# Patient Record
Sex: Female | Born: 1960 | Race: White | Hispanic: No | Marital: Single | Smoking: Never smoker
Health system: Southern US, Community
[De-identification: ages and names within clinical notes are randomized; demographics above are authoritative.]

## PROBLEM LIST (undated history)

## (undated) DIAGNOSIS — R42 Dizziness and giddiness: Secondary | ICD-10-CM

## (undated) DIAGNOSIS — E785 Hyperlipidemia, unspecified: Secondary | ICD-10-CM

## (undated) DIAGNOSIS — M1990 Unspecified osteoarthritis, unspecified site: Secondary | ICD-10-CM

## (undated) DIAGNOSIS — F411 Generalized anxiety disorder: Secondary | ICD-10-CM

## (undated) DIAGNOSIS — J45909 Unspecified asthma, uncomplicated: Secondary | ICD-10-CM

## (undated) DIAGNOSIS — Z87442 Personal history of urinary calculi: Secondary | ICD-10-CM

## (undated) DIAGNOSIS — J309 Allergic rhinitis, unspecified: Secondary | ICD-10-CM

## (undated) DIAGNOSIS — K219 Gastro-esophageal reflux disease without esophagitis: Secondary | ICD-10-CM

## (undated) HISTORY — DX: Unspecified osteoarthritis, unspecified site: M19.90

## (undated) HISTORY — DX: Gastro-esophageal reflux disease without esophagitis: K21.9

## (undated) HISTORY — DX: Dizziness and giddiness: R42

## (undated) HISTORY — PX: OTHER SURGICAL HISTORY: SHX169

## (undated) HISTORY — DX: Unspecified asthma, uncomplicated: J45.909

## (undated) HISTORY — DX: Personal history of urinary calculi: Z87.442

## (undated) HISTORY — PX: APPENDECTOMY: SHX54

## (undated) HISTORY — DX: Allergic rhinitis, unspecified: J30.9

## (undated) HISTORY — PX: TONSILLECTOMY: SHX5217

## (undated) HISTORY — DX: Hyperlipidemia, unspecified: E78.5

## (undated) HISTORY — DX: Generalized anxiety disorder: F41.1

---

## 1998-10-18 ENCOUNTER — Encounter: Admission: RE | Admit: 1998-10-18 | Discharge: 1999-01-16 | Payer: Self-pay | Admitting: Specialist

## 1999-03-07 ENCOUNTER — Ambulatory Visit (HOSPITAL_COMMUNITY): Admission: RE | Admit: 1999-03-07 | Discharge: 1999-03-07 | Payer: Self-pay | Admitting: Orthopedic Surgery

## 1999-04-06 ENCOUNTER — Other Ambulatory Visit: Admission: RE | Admit: 1999-04-06 | Discharge: 1999-04-06 | Payer: Self-pay | Admitting: Obstetrics & Gynecology

## 2000-05-03 ENCOUNTER — Other Ambulatory Visit: Admission: RE | Admit: 2000-05-03 | Discharge: 2000-05-03 | Payer: Self-pay | Admitting: Obstetrics & Gynecology

## 2001-05-19 ENCOUNTER — Other Ambulatory Visit: Admission: RE | Admit: 2001-05-19 | Discharge: 2001-05-19 | Payer: Self-pay | Admitting: Obstetrics & Gynecology

## 2002-07-03 ENCOUNTER — Other Ambulatory Visit: Admission: RE | Admit: 2002-07-03 | Discharge: 2002-07-03 | Payer: Self-pay | Admitting: Obstetrics & Gynecology

## 2002-12-03 ENCOUNTER — Encounter: Payer: Self-pay | Admitting: Obstetrics & Gynecology

## 2002-12-03 ENCOUNTER — Encounter: Admission: RE | Admit: 2002-12-03 | Discharge: 2002-12-03 | Payer: Self-pay | Admitting: Obstetrics & Gynecology

## 2003-04-20 ENCOUNTER — Encounter: Payer: Self-pay | Admitting: Orthopaedic Surgery

## 2003-04-21 ENCOUNTER — Inpatient Hospital Stay (HOSPITAL_COMMUNITY): Admission: RE | Admit: 2003-04-21 | Discharge: 2003-04-22 | Payer: Self-pay | Admitting: Orthopaedic Surgery

## 2003-04-21 ENCOUNTER — Encounter: Payer: Self-pay | Admitting: Orthopaedic Surgery

## 2003-04-22 ENCOUNTER — Encounter: Payer: Self-pay | Admitting: Orthopaedic Surgery

## 2003-08-12 ENCOUNTER — Other Ambulatory Visit: Admission: RE | Admit: 2003-08-12 | Discharge: 2003-08-12 | Payer: Self-pay | Admitting: Obstetrics & Gynecology

## 2004-01-03 ENCOUNTER — Encounter: Admission: RE | Admit: 2004-01-03 | Discharge: 2004-01-03 | Payer: Self-pay | Admitting: Obstetrics & Gynecology

## 2004-08-25 ENCOUNTER — Other Ambulatory Visit: Admission: RE | Admit: 2004-08-25 | Discharge: 2004-08-25 | Payer: Self-pay | Admitting: Obstetrics & Gynecology

## 2005-01-08 ENCOUNTER — Encounter: Admission: RE | Admit: 2005-01-08 | Discharge: 2005-01-08 | Payer: Self-pay | Admitting: Obstetrics & Gynecology

## 2005-09-14 ENCOUNTER — Other Ambulatory Visit: Admission: RE | Admit: 2005-09-14 | Discharge: 2005-09-14 | Payer: Self-pay | Admitting: Obstetrics & Gynecology

## 2006-01-09 ENCOUNTER — Encounter: Admission: RE | Admit: 2006-01-09 | Discharge: 2006-01-09 | Payer: Self-pay | Admitting: Obstetrics & Gynecology

## 2006-01-29 ENCOUNTER — Encounter: Admission: RE | Admit: 2006-01-29 | Discharge: 2006-01-29 | Payer: Self-pay | Admitting: Obstetrics & Gynecology

## 2007-01-29 ENCOUNTER — Ambulatory Visit: Payer: Self-pay | Admitting: Internal Medicine

## 2007-01-29 LAB — CONVERTED CEMR LAB
Albumin: 3.1 g/dL — ABNORMAL LOW (ref 3.5–5.2)
Alkaline Phosphatase: 60 units/L (ref 39–117)
BUN: 9 mg/dL (ref 6–23)
Basophils Relative: 0.6 % (ref 0.0–1.0)
Bilirubin, Direct: 0.1 mg/dL (ref 0.0–0.3)
CO2: 26 meq/L (ref 19–32)
Calcium: 8.5 mg/dL (ref 8.4–10.5)
Chloride: 109 meq/L (ref 96–112)
Cholesterol: 190 mg/dL (ref 0–200)
Creatinine, Ser: 0.5 mg/dL (ref 0.4–1.2)
Glucose, Bld: 96 mg/dL (ref 70–99)
HCT: 37.4 % (ref 36.0–46.0)
Ketones, ur: NEGATIVE mg/dL
LDL Cholesterol: 91 mg/dL (ref 0–99)
MCHC: 34.6 g/dL (ref 30.0–36.0)
Monocytes Relative: 7.6 % (ref 3.0–11.0)
Platelets: 418 10*3/uL — ABNORMAL HIGH (ref 150–400)
RDW: 12.8 % (ref 11.5–14.6)
Total Bilirubin: 0.6 mg/dL (ref 0.3–1.2)
Total Protein: 6 g/dL (ref 6.0–8.3)
pH: 6.5 (ref 5.0–8.0)

## 2007-02-25 ENCOUNTER — Encounter: Admission: RE | Admit: 2007-02-25 | Discharge: 2007-02-25 | Payer: Self-pay | Admitting: Obstetrics & Gynecology

## 2007-06-14 ENCOUNTER — Ambulatory Visit: Payer: Self-pay | Admitting: Family Medicine

## 2007-06-19 ENCOUNTER — Ambulatory Visit: Payer: Self-pay | Admitting: Internal Medicine

## 2007-06-24 ENCOUNTER — Ambulatory Visit: Payer: Self-pay | Admitting: Internal Medicine

## 2007-06-24 LAB — CONVERTED CEMR LAB
Ketones, ur: NEGATIVE mg/dL
Leukocytes, UA: NEGATIVE
Specific Gravity, Urine: >=1.03 (ref 1.000–1.03)
Total Protein, Urine: NEGATIVE mg/dL

## 2007-06-30 ENCOUNTER — Ambulatory Visit: Payer: Self-pay | Admitting: Cardiology

## 2007-09-09 ENCOUNTER — Encounter: Admission: RE | Admit: 2007-09-09 | Discharge: 2007-09-09 | Payer: Self-pay | Admitting: Internal Medicine

## 2008-02-16 ENCOUNTER — Encounter: Admission: RE | Admit: 2008-02-16 | Discharge: 2008-02-16 | Payer: Self-pay | Admitting: Obstetrics & Gynecology

## 2008-03-08 ENCOUNTER — Telehealth: Payer: Self-pay | Admitting: Internal Medicine

## 2008-10-14 ENCOUNTER — Ambulatory Visit: Payer: Self-pay | Admitting: Internal Medicine

## 2008-10-14 DIAGNOSIS — F411 Generalized anxiety disorder: Secondary | ICD-10-CM

## 2008-10-14 DIAGNOSIS — K219 Gastro-esophageal reflux disease without esophagitis: Secondary | ICD-10-CM | POA: Insufficient documentation

## 2008-10-14 DIAGNOSIS — Z87442 Personal history of urinary calculi: Secondary | ICD-10-CM | POA: Insufficient documentation

## 2008-10-14 DIAGNOSIS — E785 Hyperlipidemia, unspecified: Secondary | ICD-10-CM

## 2008-10-14 DIAGNOSIS — J309 Allergic rhinitis, unspecified: Secondary | ICD-10-CM | POA: Insufficient documentation

## 2008-10-14 DIAGNOSIS — J45909 Unspecified asthma, uncomplicated: Secondary | ICD-10-CM

## 2008-10-14 DIAGNOSIS — M1990 Unspecified osteoarthritis, unspecified site: Secondary | ICD-10-CM

## 2008-10-14 DIAGNOSIS — J069 Acute upper respiratory infection, unspecified: Secondary | ICD-10-CM | POA: Insufficient documentation

## 2008-10-14 HISTORY — DX: Hyperlipidemia, unspecified: E78.5

## 2008-10-14 HISTORY — DX: Unspecified asthma, uncomplicated: J45.909

## 2008-10-14 HISTORY — DX: Unspecified osteoarthritis, unspecified site: M19.90

## 2008-10-14 HISTORY — DX: Allergic rhinitis, unspecified: J30.9

## 2008-10-14 HISTORY — DX: Personal history of urinary calculi: Z87.442

## 2008-10-14 HISTORY — DX: Gastro-esophageal reflux disease without esophagitis: K21.9

## 2008-10-14 HISTORY — DX: Generalized anxiety disorder: F41.1

## 2008-10-14 LAB — CONVERTED CEMR LAB
AST: 20 units/L (ref 0–37)
Albumin: 3.3 g/dL — ABNORMAL LOW (ref 3.5–5.2)
Alkaline Phosphatase: 58 units/L (ref 39–117)
Basophils Relative: 1 % (ref 0.0–3.0)
Bilirubin, Direct: 0.1 mg/dL (ref 0.0–0.3)
CO2: 27 meq/L (ref 19–32)
Calcium: 8.8 mg/dL (ref 8.4–10.5)
Cholesterol: 222 mg/dL (ref 0–200)
Eosinophils Absolute: 0.1 10*3/uL (ref 0.0–0.7)
Eosinophils Relative: 1.7 % (ref 0.0–5.0)
GFR calc Af Amer: 139 mL/min
Glucose, Bld: 106 mg/dL — ABNORMAL HIGH (ref 70–99)
HCT: 36.4 % (ref 36.0–46.0)
HDL: 78.2 mg/dL (ref >39.0)
Lymphocytes Relative: 33 % (ref 12.0–46.0)
Monocytes Relative: 8.1 % (ref 3.0–12.0)
Neutrophils Relative %: 56.2 % (ref 43.0–77.0)
RBC: 4.47 M/uL (ref 3.87–5.11)
RDW: 12.8 % (ref 11.5–14.6)
Triglycerides: 108 mg/dL (ref 0–149)
WBC: 7.6 10*3/uL (ref 4.5–10.5)

## 2008-11-03 ENCOUNTER — Ambulatory Visit: Payer: Self-pay | Admitting: Internal Medicine

## 2008-11-03 DIAGNOSIS — R42 Dizziness and giddiness: Secondary | ICD-10-CM | POA: Insufficient documentation

## 2008-11-03 DIAGNOSIS — H6690 Otitis media, unspecified, unspecified ear: Secondary | ICD-10-CM | POA: Insufficient documentation

## 2008-11-03 HISTORY — DX: Dizziness and giddiness: R42

## 2009-02-14 ENCOUNTER — Telehealth (INDEPENDENT_AMBULATORY_CARE_PROVIDER_SITE_OTHER): Payer: Self-pay | Admitting: *Deleted

## 2010-01-13 ENCOUNTER — Ambulatory Visit: Payer: Self-pay | Admitting: Internal Medicine

## 2010-01-13 DIAGNOSIS — H1030 Unspecified acute conjunctivitis, unspecified eye: Secondary | ICD-10-CM | POA: Insufficient documentation

## 2010-01-13 DIAGNOSIS — J019 Acute sinusitis, unspecified: Secondary | ICD-10-CM | POA: Insufficient documentation

## 2010-01-31 ENCOUNTER — Encounter: Admission: RE | Admit: 2010-01-31 | Discharge: 2010-01-31 | Payer: Self-pay | Admitting: Obstetrics & Gynecology

## 2010-07-07 ENCOUNTER — Ambulatory Visit: Payer: Self-pay | Admitting: Internal Medicine

## 2010-07-07 DIAGNOSIS — L03119 Cellulitis of unspecified part of limb: Secondary | ICD-10-CM

## 2010-07-07 DIAGNOSIS — L02519 Cutaneous abscess of unspecified hand: Secondary | ICD-10-CM | POA: Insufficient documentation

## 2010-09-06 ENCOUNTER — Ambulatory Visit: Payer: Self-pay | Admitting: Internal Medicine

## 2010-09-06 LAB — CONVERTED CEMR LAB
ALT: 20 units/L (ref 0–35)
AST: 17 units/L (ref 0–37)
Albumin: 3.5 g/dL (ref 3.5–5.2)
BUN: 16 mg/dL (ref 6–23)
Basophils Relative: 0.7 % (ref 0.0–3.0)
Bilirubin, Direct: 0.1 mg/dL (ref 0.0–0.3)
Calcium: 9.1 mg/dL (ref 8.4–10.5)
Chloride: 107 meq/L (ref 96–112)
Creatinine, Ser: 0.7 mg/dL (ref 0.4–1.2)
Direct LDL: 109.3 mg/dL
GFR calc non Af Amer: 99.94 mL/min (ref >60)
Glucose, Bld: 86 mg/dL (ref 70–99)
HDL: 72.7 mg/dL (ref >39.00)
Lymphocytes Relative: 25.3 % (ref 12.0–46.0)
MCHC: 33.9 g/dL (ref 30.0–36.0)
Monocytes Relative: 6 % (ref 3.0–12.0)
Neutro Abs: 7.4 10*3/uL (ref 1.4–7.7)
Neutrophils Relative %: 66.5 % (ref 43.0–77.0)
Potassium: 4.8 meq/L (ref 3.5–5.1)
RDW: 14.5 % (ref 11.5–14.6)
Sodium: 138 meq/L (ref 135–145)
Total Protein, Urine: NEGATIVE mg/dL
Total Protein: 6.2 g/dL (ref 6.0–8.3)
Urine Glucose: NEGATIVE mg/dL
Urobilinogen, UA: 0.2 (ref 0.0–1.0)
VLDL: 20.8 mg/dL (ref 0.0–40.0)

## 2010-11-14 ENCOUNTER — Ambulatory Visit (HOSPITAL_COMMUNITY)
Admission: RE | Admit: 2010-11-14 | Discharge: 2010-11-14 | Payer: Self-pay | Source: Home / Self Care | Attending: Orthopedic Surgery | Admitting: Orthopedic Surgery

## 2010-12-02 ENCOUNTER — Other Ambulatory Visit: Payer: Self-pay | Admitting: Obstetrics & Gynecology

## 2010-12-02 DIAGNOSIS — Z1239 Encounter for other screening for malignant neoplasm of breast: Secondary | ICD-10-CM

## 2010-12-03 ENCOUNTER — Encounter: Payer: Self-pay | Admitting: Obstetrics & Gynecology

## 2010-12-12 NOTE — Assessment & Plan Note (Signed)
Summary: 1 mos f/u/cd   Vital Signs:  Patient profile:   50 year old female Height:      69 inches Weight:      284.50 pounds O2 Sat:      98 % Temp:     98.9 degrees F Pulse rate:   80 / minute BP sitting:   140 / 82  (left arm) Cuff size:   large  Vitals Entered By: Jarome Lamas (September 06, 2010 9:27 AM)  Preventive Care Screening  Last Flu Shot:    Date:  08/29/2010    Results:  given      schedled for yearly  pap in feb 2012, due for mammogram  CC: 1 month fl/up  /pb Comments pt no longer taking septra ds.   CC:  1 month fl/up  /pb.  History of Present Illness: here for wellness, overall doing well ; due for c-spine surgury in fev 2012, chronic pain no change and no bowel or bladder change, fever, wt loss, or worsening extremity pain/weak/numb. Had flu shot ta work earlier this season. Pt denies CP, worsening sob, doe, wheezing, orthopnea, pnd, worsening LE edema, palps, dizziness or syncope  Pt denies new neuro symptoms such as headache, facial or extremity weakness  Pt denies polydipsia, polyuria.  Overall good compliance with meds, trying to follow low chol  diet, wt stable, little excercise however Hard to lose wt.  Denies worsening depressive symptoms, suicidal ideation, or panic.  No fever, wt loss, night sweats, loss of appetite or other constitutional symptoms  Pt states good ability with ADL's, low fall risk, home safety reviewed and adequate, no significant change in hearing or vision, trying to follow lower chol diet, and occasionally active only with regular excercise.   Preventive Screening-Counseling & Management      Drug Use:  no.    Problems Prior to Update: 1)  Cellulitis&abscess of Hand Except Fingers&thumb  (ICD-682.4) 2)  Conjunctivitis, Acute, Bilateral  (ICD-372.00) 3)  Sinusitis- Acute-nos  (ICD-461.9) 4)  Intermittent Vertigo  (ICD-780.4) 5)  Otitis Media, Acute, Left  (ICD-382.9) 6)  Nephrolithiasis, Hx of  (ICD-V13.01) 7)  Hyperlipidemia   (ICD-272.4) 8)  Anxiety  (ICD-300.00) 9)  Asthma  (ICD-493.90) 10)  Uri  (ICD-465.9) 11)  Preventive Health Care  (ICD-V70.0) 12)  Degenerative Joint Disease  (ICD-715.90) 13)  Gerd  (ICD-530.81) 14)  Allergic Rhinitis  (ICD-477.9)  Medications Prior to Update: 1)  Nexium 40 Mg Cpdr (Esomeprazole Magnesium) .Marland Kitchen.. 1 By Mouth Once Daily 2)  Indomethacin Cr 75 Mg Cr-Caps (Indomethacin) .Marland Kitchen.. 1 By Mouth Two Times A Day As Needed 3)  Septra Ds 800-160 Mg Tabs (Sulfamethoxazole-Trimethoprim) .Marland Kitchen.. 1po Two Times A Day 4)  Hydrocodone-Acetaminophen 7.5-325 Mg Tabs (Hydrocodone-Acetaminophen) .Marland Kitchen.. 1po Q 4-6 Hrs As Needed Pain 5)  Lyrica 75 Mg Caps (Pregabalin) .Marland Kitchen.. 1 By Mouth Two Times A Day  Current Medications (verified): 1)  Nexium 40 Mg Cpdr (Esomeprazole Magnesium) .Marland Kitchen.. 1 By Mouth Once Daily 2)  Indomethacin Cr 75 Mg Cr-Caps (Indomethacin) .Marland Kitchen.. 1 By Mouth Two Times A Day As Needed 3)  Septra Ds 800-160 Mg Tabs (Sulfamethoxazole-Trimethoprim) .Marland Kitchen.. 1po Two Times A Day 4)  Hydrocodone-Acetaminophen 7.5-325 Mg Tabs (Hydrocodone-Acetaminophen) .Marland Kitchen.. 1po Q 4-6 Hrs As Needed Pain 5)  Lyrica 75 Mg Caps (Pregabalin) .Marland Kitchen.. 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  Past Surgical History: Last updated: 10/14/2008 s/p nasal surgury x 2 Appendectomy Tonsillectomy s/p right knee surgury x 4 (cartilage torn) s/p left knee surgury x 1 s/p c-spine disc surgury 2004 s/p left ulnar nerve surgury x 2 Caesarean section  Family History: Last updated: 10/14/2008 father with renal stone grandfather died with DM, cancer  Social History: Last updated: 09/06/2010 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 father with renal stone grandfather died with DM, cancer  Social History: Reviewed history from 10/14/2008 and no changes required. 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         all otherwise negative per pt -    Physical Exam  General:  alert and overweight-appearing.   Head:  normocephalic and atraumatic.   Eyes:  vision grossly intact, pupils equal, and pupils round.   Ears:  R ear normal and L ear normal.   Nose:  no external deformity and no nasal discharge.   Mouth:  no gingival abnormalities and pharynx pink and moist.   Neck:  supple and no masses.   Lungs:  normal respiratory effort and normal breath sounds.   Heart:  normal rate and regular rhythm.   Abdomen:  soft, non-tender, and normal bowel sounds.   Msk:  no joint tenderness and no joint swelling.   Extremities:  no edema, no erythema  Neurologic:  strength normal in all extremities and gait normal.   Skin:  color normal and no rashes.   Psych:  not depressed appearing and slightly anxious.     Impression & Recommendations:  Problem # 1:  Preventive Health Care (ICD-V70.0) Overall doing well, age appropriate education and counseling updated, referral for preventive services and immunizations addressed, dietary counseling and smoking status adressed , most  recent labs reviewed, ecg reviewed or declined I have personally reviewed and have noted 1.The patient's medical and social history 2.Their use of alcohol, tobacco or illicit drugs 3.Their current medications and supplements 4. Functional ability including ADL's, fall risk, home safety risk, hearing & visual impairment  5.Diet and physical activities 6.Evidence for depression or mood disorders The patients weight, height, BMI  have been recorded in the chart I have made referrals, counseling and provided education to the patient based review of the above  Orders: TLB-BMP (Basic Metabolic Panel-BMET) (80048-METABOL) TLB-CBC Platelet - w/Differential (85025-CBCD) TLB-Hepatic/Liver Function Pnl (80076-HEPATIC) TLB-Lipid Panel (80061-LIPID) TLB-TSH (Thyroid Stimulating Hormone) (84443-TSH) TLB-Udip ONLY (81003-UDIP)  Complete Medication List: 1)  Nexium 40 Mg Cpdr (Esomeprazole magnesium) .Marland Kitchen.. 1 by mouth once daily 2)  Indomethacin Cr 75 Mg Cr-caps (Indomethacin) .Marland Kitchen.. 1 by mouth two times a day as needed 3)  Septra Ds 800-160 Mg Tabs (Sulfamethoxazole-trimethoprim) .Marland Kitchen.. 1po two times a day 4)  Hydrocodone-acetaminophen 7.5-325 Mg Tabs (Hydrocodone-acetaminophen) .Marland Kitchen.. 1po q 4-6 hrs as needed pain 5)  Lyrica 75 Mg Caps (Pregabalin) .Marland Kitchen.. 1 by mouth two times a day  Patient Instructions: 1)  please call for your yearly mammogram 2)  Please go to the Lab in the basement for your blood and/or urine tests today 3)  Please call the number on the [facility] Card for results of your testing  4)  Please schedule a follow-up appointment in 1 year, or sooner if needed   Orders Added: 1)  TLB-BMP (Basic Metabolic Panel-BMET) [80048-METABOL] 2)  TLB-CBC Platelet - w/Differential [85025-CBCD] 3)  TLB-Hepatic/Liver Function Pnl [80076-HEPATIC] 4)  TLB-Lipid Panel [80061-LIPID] 5)  TLB-TSH (Thyroid Stimulating Hormone) [84443-TSH] 6)  TLB-Udip ONLY [81003-UDIP] 7)  Est. Patient 40-64 years [21308]

## 2010-12-12 NOTE — Assessment & Plan Note (Signed)
Summary: SPIDER BITE---STC   Vital Signs:  Patient profile:   50 year old female Height:      69 inches Weight:      289.75 pounds BMI:     42.94 O2 Sat:      97 % on Room air Temp:     98.8 degrees F oral Pulse rate:   89 / minute BP sitting:   112 / 80  (left arm) Cuff size:   large  Vitals Entered By: Zella Ball Ewing CMA (AAMA) (July 07, 2010 2:22 PM)  O2 Flow:  Room air CC: Spider bite,  thumb on right hand, swollen, sharp pains/RE   CC:  Spider bite, thumb on right hand, swollen, and sharp pains/RE.  History of Present Illness: here after working in the yard, and believes she was bitten by an insect/spider to the end of the left thumb 2 days ago, now with worsening mild to mod red, swelling, tender to the whole distal left thmub past the DIP;  no red streaks or fluctuance or drainage or fever, chills.  No ulcers or traums, or hx of gout.  Problems Prior to Update: 1)  Cellulitis&abscess of Hand Except Fingers&thumb  (ICD-682.4) 2)  Conjunctivitis, Acute, Bilateral  (ICD-372.00) 3)  Sinusitis- Acute-nos  (ICD-461.9) 4)  Intermittent Vertigo  (ICD-780.4) 5)  Otitis Media, Acute, Left  (ICD-382.9) 6)  Nephrolithiasis, Hx of  (ICD-V13.01) 7)  Hyperlipidemia  (ICD-272.4) 8)  Anxiety  (ICD-300.00) 9)  Asthma  (ICD-493.90) 10)  Uri  (ICD-465.9) 11)  Preventive Health Care  (ICD-V70.0) 12)  Degenerative Joint Disease  (ICD-715.90) 13)  Gerd  (ICD-530.81) 14)  Allergic Rhinitis  (ICD-477.9)  Medications Prior to Update: 1)  Nexium 40 Mg Cpdr (Esomeprazole Magnesium) .Marland Kitchen.. 1 By Mouth Once Daily 2)  Singulair 10 Mg Tabs (Montelukast Sodium) .Marland Kitchen.. 1 By Mouth Daily 3)  Tri-Sprintec 0.035 Mg Tabs (Norgestimate-Ethinyl Estradiol) .Marland Kitchen.. 1 By Mouth Daily 4)  Tylenol Extra Strength 500 Mg Tabs (Acetaminophen) 5)  Indomethacin Cr 75 Mg Cr-Caps (Indomethacin) .Marland Kitchen.. 1 By Mouth Two Times A Day As Needed 6)  Avelox 400 Mg Tabs (Moxifloxacin Hcl) .Marland Kitchen.. 1po Once Daily  Current Medications  (verified): 1)  Nexium 40 Mg Cpdr (Esomeprazole Magnesium) .Marland Kitchen.. 1 By Mouth Once Daily 2)  Indomethacin Cr 75 Mg Cr-Caps (Indomethacin) .Marland Kitchen.. 1 By Mouth Two Times A Day As Needed 3)  Septra Ds 800-160 Mg Tabs (Sulfamethoxazole-Trimethoprim) .Marland Kitchen.. 1po Two Times A Day 4)  Hydrocodone-Acetaminophen 7.5-325 Mg Tabs (Hydrocodone-Acetaminophen) .Marland Kitchen.. 1po Q 4-6 Hrs As Needed Pain 5)  Lyrica 75 Mg Caps (Pregabalin) .Marland Kitchen.. 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   Head:  normocephalic and atraumatic.   Eyes:  vision grossly intact, pupils equal, and pupils round.  Ears:  R ear normal and L ear normal.   Nose:  no external deformity and no nasal discharge.   Mouth:  no gingival abnormalities and pharynx pink and moist.   Neck:  supple and no masses.   Lungs:  normal respiratory effort and normal breath sounds.   Heart:  normal rate and regular rhythm.   Extremities:  no edema, no erythema  Skin:  left distal thumb with 1-2 + erythema, tender and swelling distal to the MTP without flucutance or drainage or red streaks; DIP joint without effusion or tednerness and has FROM   Impression &  Recommendations:  Problem # 1:  CELLULITIS&ABSCESS OF HAND EXCEPT FINGERS&THUMB (ICD-682.4)  Her updated medication list for this problem includes:    Septra Ds 800-160 Mg Tabs (Sulfamethoxazole-trimethoprim) .Marland Kitchen... 1po two times a day left first thumb - treat as above, f/u any worsening signs or symptoms   Complete Medication List: 1)  Nexium 40 Mg Cpdr (Esomeprazole magnesium) .Marland Kitchen.. 1 by mouth once daily 2)  Indomethacin Cr 75 Mg Cr-caps (Indomethacin) .Marland Kitchen.. 1 by mouth two times a day as needed 3)  Septra Ds 800-160 Mg Tabs (Sulfamethoxazole-trimethoprim) .Marland Kitchen.. 1po two times a day 4)  Hydrocodone-acetaminophen 7.5-325 Mg Tabs (Hydrocodone-acetaminophen) .Marland Kitchen.. 1po q 4-6 hrs as needed pain 5)  Lyrica 75 Mg Caps (Pregabalin) .Marland Kitchen.. 1 by mouth two times a day  Patient Instructions: 1)  Please take all new medications as prescribed 2)  Continue all previous medications as before this visit  3)  Please schedule a follow-up appointment in 1 month with CPX labs Prescriptions: HYDROCODONE-ACETAMINOPHEN 7.5-325 MG TABS (HYDROCODONE-ACETAMINOPHEN) 1po q 4-6 hrs as needed pain  #40 x 0   Entered and Authorized by:   Corwin Levins MD   Signed by:   Corwin Levins MD on 07/07/2010   Method used:   Print then Give to Patient   RxID:   [id number] SEPTRA DS 800-160 MG TABS (SULFAMETHOXAZOLE-TRIMETHOPRIM) 1po two times a day  #20 x 0   Entered and Authorized by:   Corwin Levins MD   Signed by:   Corwin Levins MD on 07/07/2010   Method used:   Print then Give to Patient   RxID:   [id number]

## 2010-12-12 NOTE — Assessment & Plan Note (Signed)
Summary: SINUS INFECTION /NWS  #   Vital Signs:  Patient profile:   50 year old female Height:      68 inches (172.72 cm) Weight:      305.75 pounds (138.98 kg) BMI:     46.66 O2 Sat:      98 % on Room air Temp:     97.9 degrees F (36.61 degrees C) oral Pulse rate:   111 / minute BP sitting:   140 / 90  (left arm) Cuff size:   large  Vitals Entered By: Sydell Axon (January 13, 2010 4:16 PM)  O2 Flow:  Room air CC: Sinus pressure, green/yellow nasal discharge, occasional blood; sore throat   CC:  Sinus pressure, green/yellow nasal discharge, and occasional blood; sore throat.  History of Present Illness: here after being tx for pink eye at urgent care, then saw optho and tx wti prednisolone eye gtt with pt report that she was dx as not having pink eye; and now with 2 days onset grad worsening now severe facial pain, pressure, fever and greenish d/c, and slight bloody nasal d/c; with midl ST, but Pt denies CP, sob, doe, wheezing, orthopnea, pnd, worsening LE edema, palps, dizziness or syncope Pt denies new neuro symptoms such as headache, facial or extremity weakness   Problems Prior to Update: 1)  Intermittent Vertigo  (ICD-780.4) 2)  Otitis Media, Acute, Left  (ICD-382.9) 3)  Nephrolithiasis, Hx of  (ICD-V13.01) 4)  Hyperlipidemia  (ICD-272.4) 5)  Anxiety  (ICD-300.00) 6)  Asthma  (ICD-493.90) 7)  Uri  (ICD-465.9) 8)  Preventive Health Care  (ICD-V70.0) 9)  Degenerative Joint Disease  (ICD-715.90) 10)  Gerd  (ICD-530.81) 11)  Allergic Rhinitis  (ICD-477.9)  Medications Prior to Update: 1)  Nexium 40 Mg Cpdr (Esomeprazole Magnesium) .Marland Kitchen.. 1 By Mouth Once Daily 2)  Singulair 10 Mg Tabs (Montelukast Sodium) .Marland Kitchen.. 1 By Mouth Daily 3)  Tri-Sprintec 0.035 Mg Tabs (Norgestimate-Ethinyl Estradiol) .Marland Kitchen.. 1 By Mouth Daily 4)  Tylenol Extra Strength 500 Mg Tabs (Acetaminophen) 5)  Indomethacin Cr 75 Mg Cr-Caps (Indomethacin) .Marland Kitchen.. 1 By Mouth Two Times A Day As Needed 6)   Cephalexin 500 Mg Caps (Cephalexin) .Marland Kitchen.. 1po Three Times A Day 7)  Meclizine Hcl 12.5 Mg Tabs (Meclizine Hcl) .Marland Kitchen.. 1 - 2 By Mouth Q 6 Hrs As Needed Dizzy  Current Medications (verified): 1)  Nexium 40 Mg Cpdr (Esomeprazole Magnesium) .Marland Kitchen.. 1 By Mouth Once Daily 2)  Singulair 10 Mg Tabs (Montelukast Sodium) .Marland Kitchen.. 1 By Mouth Daily 3)  Tri-Sprintec 0.035 Mg Tabs (Norgestimate-Ethinyl Estradiol) .Marland Kitchen.. 1 By Mouth Daily 4)  Tylenol Extra Strength 500 Mg Tabs (Acetaminophen) 5)  Indomethacin Cr 75 Mg Cr-Caps (Indomethacin) .Marland Kitchen.. 1 By Mouth Two Times A Day As Needed 6)  Avelox 400 Mg Tabs (Moxifloxacin Hcl) .Marland Kitchen.. 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 , mild ill  Head:  normocephalic and atraumatic.   Eyes:  vision grossly intact, pupils equal, and pupils round,  bilat conjunct with erythema and clearish d/c.   Ears:  bilat tm's with mild erythema, canal ok, sinus marked tender bilat Nose:  nasal dischargemucosal pallor and mucosal edema.   Mouth:  pharyngeal erythema and fair dentition.   Neck:  supple and cervical lymphadenopathy.   Lungs:  normal respiratory effort and normal breath sounds.   Heart:  normal rate and  regular rhythm.   Extremities:  no edema, no erythema    Impression & Recommendations:  Problem # 1:  SINUSITIS- ACUTE-NOS (ICD-461.9)  Her updated medication list for this problem includes:    Avelox 400 Mg Tabs (Moxifloxacin hcl) .Marland Kitchen... 1po once daily treat as above, f/u any worsening signs or symptoms   Problem # 2:  CONJUNCTIVITIS, ACUTE, BILATERAL (ICD-372.00) ok to cont the prednisolone as per opthomology  Problem # 3:  ASTHMA (ICD-493.90)  Her updated medication list for this problem includes:    Singulair 10 Mg Tabs (Montelukast sodium) .Marland Kitchen... 1 by mouth daily stable overall by hx and exam, ok to continue meds/tx as is   Problem # 4:  HYPERLIPIDEMIA (ICD-272.4)  Labs Reviewed: SGOT: 20 (10/14/2008)   SGPT: 20 (10/14/2008)   HDL:78.2 (10/14/2008), 72.9 (01/29/2007)  LDL:DEL (10/14/2008), 91 (32/35/5732)  Chol:222 (10/14/2008), 190 (01/29/2007)  Trig:108 (10/14/2008), 133 (01/29/2007) d/w - to cont diet, goal ldl less than 100, consider statin  Complete Medication List: 1)  Nexium 40 Mg Cpdr (Esomeprazole magnesium) .Marland Kitchen.. 1 by mouth once daily 2)  Singulair 10 Mg Tabs (Montelukast sodium) .Marland Kitchen.. 1 by mouth daily 3)  Tri-sprintec 0.035 Mg Tabs (Norgestimate-ethinyl estradiol) .Marland Kitchen.. 1 by mouth daily 4)  Tylenol Extra Strength 500 Mg Tabs (Acetaminophen) 5)  Indomethacin Cr 75 Mg Cr-caps (Indomethacin) .Marland Kitchen.. 1 by mouth two times a day as needed 6)  Avelox 400 Mg Tabs (Moxifloxacin hcl) .Marland Kitchen.. 1po once daily  Patient Instructions: 1)  Please take all new medications as prescribed 2)  Continue all previous medications as before this visit  3)  Please schedule a follow-up appointment in 6 months wtih CPX labs Prescriptions: INDOMETHACIN CR 75 MG CR-CAPS (INDOMETHACIN) 1 by mouth two times a day as needed  #60 x 11   Entered and Authorized by:   Corwin Levins MD   Signed by:   Corwin Levins MD on 01/13/2010   Method used:   Print then Give to Patient   RxID:    [id number] AVELOX 400 MG TABS (MOXIFLOXACIN HCL) 1po once daily  #10 x 0   Entered and Authorized by:   Corwin Levins MD   Signed by:   Corwin Levins MD on 01/13/2010   Method used:   Print then Give to Patient   RxID:   [id number]

## 2011-01-22 LAB — SURGICAL PCR SCREEN
MRSA, PCR: NEGATIVE
Staphylococcus aureus: NEGATIVE

## 2011-01-22 LAB — BASIC METABOLIC PANEL
Chloride: 104 mEq/L (ref 96–112)
Creatinine, Ser: 0.74 mg/dL (ref 0.4–1.2)
Glucose, Bld: 88 mg/dL (ref 70–99)
Potassium: 5 mEq/L (ref 3.5–5.1)

## 2011-01-22 LAB — CBC
Hemoglobin: 15.1 g/dL — ABNORMAL HIGH (ref 12.0–15.0)
MCHC: 32.4 g/dL (ref 30.0–36.0)
MCV: 88.1 fL (ref 78.0–100.0)
RDW: 13 % (ref 11.5–15.5)

## 2011-01-22 LAB — DIFFERENTIAL
Basophils Absolute: 0.1 10*3/uL (ref 0.0–0.1)
Eosinophils Relative: 2 % (ref 0–5)
Lymphocytes Relative: 30 % (ref 12–46)
Lymphs Abs: 2.9 10*3/uL (ref 0.7–4.0)
Neutro Abs: 5.9 10*3/uL (ref 1.7–7.7)
Neutrophils Relative %: 59 % (ref 43–77)

## 2011-01-22 LAB — APTT: aPTT: 28 seconds (ref 24–37)

## 2011-01-22 LAB — PROTIME-INR
INR: 0.9 (ref 0.00–1.49)
Prothrombin Time: 12.4 seconds (ref 11.6–15.2)

## 2011-02-02 ENCOUNTER — Ambulatory Visit: Payer: Self-pay

## 2011-02-02 ENCOUNTER — Ambulatory Visit
Admission: RE | Admit: 2011-02-02 | Discharge: 2011-02-02 | Disposition: A | Discharge status: Home / Self Care | Payer: 59 | Source: Ambulatory Visit | Attending: Obstetrics & Gynecology | Admitting: Obstetrics & Gynecology

## 2011-02-02 DIAGNOSIS — Z1239 Encounter for other screening for malignant neoplasm of breast: Secondary | ICD-10-CM

## 2011-03-08 ENCOUNTER — Other Ambulatory Visit: Payer: Self-pay | Admitting: Internal Medicine

## 2011-03-30 NOTE — H&P (Signed)
[name], [name]                          ACCOUNT NO.:  [id number]   MEDICAL RECORD NO.:  [id number]                   PATIENT TYPE:  INP   LOCATION:  5023                                 FACILITY:  [facility]   PHYSICIAN:  Sharolyn Douglas, M.D.                     DATE OF BIRTH:  1961-08-24   DATE OF ADMISSION:  04/21/2003  DATE OF DISCHARGE:  04/22/2003                                HISTORY & PHYSICAL   CHIEF COMPLAINT:  Neck and left upper extremity pain.   HISTORY OF PRESENT ILLNESS:  The patient is a 50 year old female with neck  and left upper extremity pain for a number of months now.  She has failed  conservative treatment.  The pain has been getting progressively worse.  It  is interfering with her activities of daily living and quality of life.  Risks and benefits of the proposed surgery were discussed with the patient  by Dr. Noel Gerold as well as myself.  She indicated understanding and opted to  proceed.   ALLERGIES:  CODEINE, TRAMADOL, and HYDROCODONE all cause itching.   MEDICATIONS:  1. Flonase one spray in each nostril daily.  2. Singulair 10 p.o. daily.  3. Nexium 40 mg p.o. daily.   PAST MEDICAL HISTORY:  1. Asthma.  2. Gastroesophageal reflux disease.   PAST SURGICAL HISTORY:  1. Left knee scope.  2. Right knee scope x4.  3. Left wrist ORIF.  4. Nerve repair.  5. C-section.   HABITS:  She denies tobacco use.  She denies alcohol use.   SOCIAL HISTORY:  She is single.  She has one child who is 4 years old.  She has family to help with her postoperative course.   FAMILY HISTORY:  Mother alive at age 55 with osteoarthritis, otherwise  healthy.   REVIEW OF SYSTEMS:  The patient denies any fevers, chills, sweats or  bleeding tendencies.  CNS:  Denies blurring vision, double vision, seizures,  headache or paralysis.  CARDIOVASCULAR:  Denies chest pain, angina,  orthopnea, claudication, or palpitations.  PULMONARY:  Denies shortness of  breath, productive  cough or hemoptysis.  GI:  Denies nausea, vomiting,  constipation, diarrhea, melena or bloody stools.  GU:  Denies dysuria,  hematuria or discharge.  MUSCULOSKELETAL:  As per HPI.   PHYSICAL EXAMINATION:  GENERAL APPEARANCE:  The patient is an 50 year old  female who is alert and oriented and in no acute distress.  She is well-  developed, well-nourished and appears her stated age.  She is pleasant and  cooperative to examination.  VITAL SIGNS:  Blood pressure 132/84, respiratory rate 16 and unlabored.  Pulses 84 and regular.  HEENT:  Head is normocephalic and atraumatic.  Pupils are equal, round and  reactive to light.  Extraocular movements intact.  Nose patent.  Pharynx is  clear.  NECK:  Soft to palpation.  No lymphadenopathy, thyromegaly  or bruits  appreciated.  CHEST:  Clear to auscultation bilaterally.  No rales, rhonchi, stridors,  wheezes or friction rubs.  BREASTS:  Not pertinent and not performed.  CARDIOVASCULAR:  S1 and S2 regular rate and rhythm with no murmurs, rubs, or  gallops.  ABDOMEN:  Positive bowel sounds, nontender and nondistended, no organomegaly  noted.  Soft to palpation.  GU:  Not pertinent and not performed.  EXTREMITIES:  Left upper extremity pain.  Sensation and motor function  grossly intact.  Pulses intact and symmetric.  Skin intact without any  lesions or rashes.   X-ray shows C5-6 and 6-7 degenerative disc disease and MRI shows herniated  nucleus pulposus.   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   PLAN:  1. Admit to [facility] on April 19, 2003, for ACDF of C5-6 and 6-7 by     Dr.  Sharolyn Douglas.  2. The patient's primary care physician is Dr. Oliver Barre. We will certainly     call him should any medical issues arise in the perioperative course.     Verlin Fester, P.A.                       Sharolyn Douglas, M.D.    CM/MEDQ  D:  04/22/2003  T:  04/22/2003  Job:  [id number]

## 2011-03-30 NOTE — Op Note (Signed)
NAMEDEVITA, NIES                          ACCOUNT NO.:  [id number]   MEDICAL RECORD NO.:  [id number]                   PATIENT TYPE:  INP   LOCATION:  2899                                 FACILITY:  [facility]   PHYSICIAN:  Sharolyn Douglas, M.D.                     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   PROCEDURE:  1. Anterior cervical diskectomy C5-6 and C6-7 with decompression of the     spinal cord and nerve roots bilaterally.  2. Anterior cervical arthrodesis C5-6 and C6-7 with placement of two Synthes     allograft prosthesis spacers packed with DBN bone graft.  3. Anterior cervical plating C5 through C7 utilizing the [facility]     system.  4. Neural monitoring utilizing SSEP's.   SURGEON:  Sharolyn Douglas, M.D.   ASSISTANT:  Verlin Fester, P.A.   ANESTHESIA:  General endotracheal.   COMPLICATIONS:  None.   INDICATIONS FOR PROCEDURE:  The patient is a 50 year old female with  persistent upper extremity radiculopathy, left greater than right secondary  to disk herniations at C5-6 and C6-7 documented in MRI scan.  After failing  a long and appropriate course of conservative treatment, she elected to  undergo anterior cervical diskectomy and fusion at C5-6 and C6-7 with the  hopes of improving her symptoms. Risks and benefits were extensively  reviewed with her. She elected to proceed.   DESCRIPTION OF PROCEDURE:  The patient was properly identified in the  holding area, taken to the operating room. She underwent general  endotracheal anesthesia without difficulty. She was given prophylactic IV  antibiotics.  She was carefully positioned on the operating room table with  the Mayfield head rest.  A towel roll was placed under her shoulder blades.  Her neck was placed in slight hyperextension.  Neural monitoring was  established in the form of SSEP's.  Five pounds of Halter traction was  applied. The neck was prepped and draped in the usual sterile fashion. A 4  cm left-sided transverse incision was made at the level of the cricoid  cartilage. Dissection was then carried down sharply through the platysma. We  encountered a large vein in the interval between the SCM and strap muscles.  This was ligated and tied off with 4-0 silk suture.  We then developed the  plane between the SCM and strap muscles down to the prevertebral space. We  identified the esophagus, trachea, and carotid sheath and protected these  structures at all times.  The first disk space identified was C5-6. A spinal  needle was placed and we confirmed our location with intraoperative x-ray.  We then elevated the longus coli muscle out over the C5-6 and C6-7 disk  spaces using  electrocautery.  The deep shadowline retractor was placed.  Subtotal diskectomies were performed at C5-6 and C6-7 utilizing sharp  annulotomy and pituitary curets.  The surgical microscope was draped and  brought into the field.  We placed Caspar distraction pins at the C5-6  level. We removed the disk material back to the PLL.  The foramen were found  to be tight secondary to uncovertebral spurring left greater than right.  Foraminotomies were performed bilaterally.  A disk fragment was removed from  the left posterolateral and foraminal position.  We took down the PLL along  the left side confirming that there was not a subligamentous component to  the herniation.  When we were done, the foramen were widely decompressed.  The posterior vertebral margins were undercut using a Kerrison.  We then  placed a 9 mm Synthes allograft prosthesis spacer packed with DBN bone graft  substitute into the 5-6 disk space. We carefully recessed this 1 mm.  We  then performed a similar procedure at C6-7. At this level, there were large  anterior osteophytes that were taken down  using Leksell rongeur.  The disk  space was severely degenerative.  We used the highspeed bur to take down the  uncovertebral osteophytes. There was severe foraminal narrowing bilaterally  left greater than right. We found a disk herniation again in the left  posterolateral position.  We used the nerve hook to remove disk material  from behind the C7 body.  The posterior vertebral margins were then undercut  using the Kerrison punch.  When we were done, the foramen were widely patent  bilaterally.  The posterior longitudinal ligament was taken down from the  mid body out to the left foramen.  We then placed a 9 mm Synthes allograft  prosthesis spacer packed with DBN bone graft material.  All bleeding was  controlled with bipolar electrocautery and Gelfoam. We then turned our  attention to plating the anterior cervical spine utilizing an ABI Viewlock  plate.  Six 14 mm screws were placed.  We confirmed the locking mechanism  engaged. The wound was copiously irrigated. We placed a deep Penrose drain.  Intraoperative x-ray showed good positioning of the C5 and C6 bone graft and  plate. Unfortunately, because of the patient's body habitus, we were unable  to completely visualize the C6-7 interspace.  The esophagus, trachea, and  carotid sheath were examined and there were no apparent injuries. The  platysma was closed with interrupted 2-0 Vicryl, subcutaneous layer closed  with interrupted 3-0 Vicryl followed by a running 4-0 subcuticular suture on  the skin. Benzoin and Steri-Strips were placed. A sterile dressing applied.  Soft cervical collar placed.  The patient extubated without difficulty,  transferred to the recovery room in stable condition.  It should be noted  there were no deleterious changes in the SSEP's throughout the procedure.                                               Sharolyn Douglas, M.D.    MC/MEDQ  D:  04/21/2003  T:  04/21/2003  Job:  [id number]

## 2011-10-10 ENCOUNTER — Other Ambulatory Visit: Payer: Self-pay | Admitting: Internal Medicine

## 2011-11-09 ENCOUNTER — Other Ambulatory Visit: Payer: Self-pay | Admitting: Internal Medicine

## 2011-11-11 ENCOUNTER — Encounter: Payer: Self-pay | Admitting: Internal Medicine

## 2011-11-11 DIAGNOSIS — Z0000 Encounter for general adult medical examination without abnormal findings: Secondary | ICD-10-CM

## 2011-11-11 DIAGNOSIS — Z0001 Encounter for general adult medical examination with abnormal findings: Secondary | ICD-10-CM | POA: Insufficient documentation

## 2011-11-12 ENCOUNTER — Encounter: Payer: Self-pay | Admitting: Internal Medicine

## 2011-11-14 ENCOUNTER — Encounter: Payer: Self-pay | Admitting: Internal Medicine

## 2011-11-14 ENCOUNTER — Other Ambulatory Visit (INDEPENDENT_AMBULATORY_CARE_PROVIDER_SITE_OTHER): Payer: 59

## 2011-11-14 ENCOUNTER — Ambulatory Visit (INDEPENDENT_AMBULATORY_CARE_PROVIDER_SITE_OTHER): Payer: 59 | Admitting: Internal Medicine

## 2011-11-14 VITALS — BP 110/70 | HR 103 | Temp 99.1°F | Ht 69.0 in | Wt 277.5 lb

## 2011-11-14 DIAGNOSIS — H6690 Otitis media, unspecified, unspecified ear: Secondary | ICD-10-CM

## 2011-11-14 DIAGNOSIS — H6691 Otitis media, unspecified, right ear: Secondary | ICD-10-CM

## 2011-11-14 DIAGNOSIS — Z0000 Encounter for general adult medical examination without abnormal findings: Secondary | ICD-10-CM

## 2011-11-14 LAB — CBC WITH DIFFERENTIAL/PLATELET
Basophils Absolute: 0.1 10*3/uL (ref 0.0–0.1)
Eosinophils Absolute: 0.2 10*3/uL (ref 0.0–0.7)
HCT: 42.2 % (ref 36.0–46.0)
Hemoglobin: 14.4 g/dL (ref 12.0–15.0)
Lymphs Abs: 3.9 10*3/uL (ref 0.7–4.0)
MCHC: 34.1 g/dL (ref 30.0–36.0)
MCV: 88.2 fl (ref 78.0–100.0)
Monocytes Absolute: 0.9 10*3/uL (ref 0.1–1.0)
Neutro Abs: 9.5 10*3/uL — ABNORMAL HIGH (ref 1.4–7.7)
RDW: 12.9 % (ref 11.5–14.6)

## 2011-11-14 LAB — URINALYSIS, ROUTINE W REFLEX MICROSCOPIC
Bilirubin Urine: NEGATIVE
Ketones, ur: NEGATIVE
Specific Gravity, Urine: >=1.03 (ref 1.000–1.030)
Urine Glucose: NEGATIVE
Urobilinogen, UA: 0.2 (ref 0.0–1.0)

## 2011-11-14 LAB — BASIC METABOLIC PANEL
Calcium: 9 mg/dL (ref 8.4–10.5)
GFR: 112.95 mL/min (ref >60.00)
Potassium: 4.5 mEq/L (ref 3.5–5.1)
Sodium: 141 mEq/L (ref 135–145)

## 2011-11-14 LAB — LIPID PANEL
HDL: 77.6 mg/dL (ref >39.00)
Total CHOL/HDL Ratio: 3
VLDL: 26.4 mg/dL (ref 0.0–40.0)

## 2011-11-14 LAB — HEPATIC FUNCTION PANEL
AST: 14 U/L (ref 0–37)
Alkaline Phosphatase: 63 U/L (ref 39–117)
Bilirubin, Direct: 0.1 mg/dL (ref 0.0–0.3)

## 2011-11-14 MED ORDER — INDOMETHACIN ER 75 MG PO CPCR: 75.0000 mg | ORAL_CAPSULE | Freq: Two times a day (BID) | ORAL | Status: DC

## 2011-11-14 MED ORDER — AZITHROMYCIN 250 MG PO TABS: ORAL_TABLET | ORAL | Status: AC

## 2011-11-14 NOTE — Patient Instructions (Signed)
Take all new medications as prescribed - the antibiotic You can also take mucinex otc for congestion if needed Continue all other medications as before Please go to LAB in the Basement for the blood and/or urine tests to be done today Please call the phone number [phone] (the PhoneTree System) for results of testing in 2-3 days;  When calling, simply dial the number, and when prompted enter the MRN number above (the Medical Record Number) and the # key, then the message should start. Please return in 1 year for your yearly visit, or sooner if needed, with Lab testing done 3-5 days before

## 2011-11-18 ENCOUNTER — Encounter: Payer: Self-pay | Admitting: Internal Medicine

## 2011-11-18 DIAGNOSIS — H6691 Otitis media, unspecified, right ear: Secondary | ICD-10-CM | POA: Insufficient documentation

## 2011-11-18 NOTE — Assessment & Plan Note (Signed)

## 2011-11-18 NOTE — Assessment & Plan Note (Signed)
Mild to mod, for antibx course,  to f/u any worsening symptoms or concerns 

## 2011-11-18 NOTE — Progress Notes (Signed)
Subjective:    Patient ID: [name], female    DOB: 07/30/1961, 51 y.o.   MRN: [id number]  HPI  Here for wellness and f/u;  Overall doing ok;  Pt denies CP, worsening SOB, DOE, wheezing, orthopnea, PND, worsening LE edema, palpitations, dizziness or syncope.  Pt denies neurological change such as new Headache, facial or extremity weakness.  Pt denies polydipsia, polyuria, or low sugar symptoms. Pt states overall good compliance with treatment and medications, good tolerability, and trying to follow lower cholesterol diet.  Pt denies worsening depressive symptoms, suicidal ideation or panic. No fever, wt loss, night sweats, loss of appetite, or other constitutional symptoms.  Pt states good ability with ADL's, low fall risk, home safety reviewed and adequate, no significant changes in hearing or vision, and occasionally active with exercise. No acute complaints except for right ear pain and low grade temp x3 days, but has had quite an eventful yr, now s/p c-spine surgury, and left achilles rupture repair, and still on lyrica for chronic neck pain Past Medical History  Diagnosis Date  . ALLERGIC RHINITIS 10/14/2008  . ANXIETY 10/14/2008  . ASTHMA 10/14/2008  . DEGENERATIVE JOINT DISEASE 10/14/2008  . GERD 10/14/2008  . HYPERLIPIDEMIA 10/14/2008  . INTERMITTENT VERTIGO 11/03/2008  . NEPHROLITHIASIS, HX OF 10/14/2008   Past Surgical History  Procedure Date  . S/p nasal surgery     x's 2  . Appendectomy   . Tonsillectomy   . S/p right knee surgery     x's 4- Cartilage torn  . S/p left knee surgery   . S/p left ulnar nerve surgery     x's 2  . Cesarean section     reports that she has never smoked. She does not have any smokeless tobacco history on file. Her alcohol and drug histories not on file. family history includes Cancer in her maternal grandfather and Diabetes in her maternal grandfather. No Known Allergies Current Outpatient Prescriptions on File Prior to Visit  Medication Sig  Dispense Refill  . pregabalin (LYRICA) 75 MG capsule Take 75 mg by mouth 2 (two) times daily.         Review of Systems Review of Systems  Constitutional: Negative for diaphoresis, activity change, appetite change and unexpected weight change.  HENT: Negative for hearing loss, ear pain, facial swelling, mouth sores and neck stiffness.   Eyes: Negative for pain, redness and visual disturbance.  Respiratory: Negative for shortness of breath and wheezing.   Cardiovascular: Negative for chest pain and palpitations.  Gastrointestinal: Negative for diarrhea, blood in stool, abdominal distention and rectal pain.  Genitourinary: Negative for hematuria, flank pain and decreased urine volume.  Musculoskeletal: Negative for myalgias and joint swelling.  Skin: Negative for color change and wound.  Neurological: Negative for syncope and numbness.  Hematological: Negative for adenopathy.  Psychiatric/Behavioral: Negative for hallucinations, self-injury, decreased concentration and agitation.        Objective:   Physical Exam BP 110/70  Pulse 103  Temp(Src) 99.1 F (37.3 C) (Oral)  Ht 5\' 9"  (1.753 m)  Wt 277 lb 8 oz (125.873 kg)  BMI 40.98 kg/m2  SpO2 98% Physical Exam  VS noted Constitutional: Pt is oriented to person, place, and time. Appears well-developed and well-nourished.  HENT:  Head: Normocephalic and atraumatic.  Right Ear: External ear normal.  Left Ear: External ear normal.  Nose: Nose normal.  Mouth/Throat: Oropharynx is clear and moist.  Bilat tm's mild erythema.  Sinus nontender.  Pharynx mild erythema Eyes:  Conjunctivae and EOM are normal. Pupils are equal, round, and reactive to light.  Neck: Normal range of motion. Neck supple. No JVD present. No tracheal deviation present.  Cardiovascular: Normal rate, regular rhythm, normal heart sounds and intact distal pulses.   Pulmonary/Chest: Effort normal and breath sounds normal.  Abdominal: Soft. Bowel sounds are normal. There  is no tenderness.  Musculoskeletal: Normal range of motion. Exhibits no edema.  Lymphadenopathy:  Has no cervical adenopathy.  Neurological: Pt is alert and oriented to person, place, and time. Pt has normal reflexes. No cranial nerve deficit.  Skin: Skin is warm and dry. No rash noted.  Psychiatric:  Has  normal mood and affect. Behavior is normal.     Assessment & Plan:

## 2012-02-25 ENCOUNTER — Other Ambulatory Visit: Payer: Self-pay | Admitting: Obstetrics & Gynecology

## 2012-02-25 DIAGNOSIS — Z1231 Encounter for screening mammogram for malignant neoplasm of breast: Secondary | ICD-10-CM

## 2012-02-26 ENCOUNTER — Encounter (INDEPENDENT_AMBULATORY_CARE_PROVIDER_SITE_OTHER): Payer: 59 | Admitting: Ophthalmology

## 2012-02-26 DIAGNOSIS — H33309 Unspecified retinal break, unspecified eye: Secondary | ICD-10-CM

## 2012-02-26 DIAGNOSIS — H43819 Vitreous degeneration, unspecified eye: Secondary | ICD-10-CM

## 2012-02-26 DIAGNOSIS — H35419 Lattice degeneration of retina, unspecified eye: Secondary | ICD-10-CM

## 2012-03-03 ENCOUNTER — Ambulatory Visit
Admission: RE | Admit: 2012-03-03 | Discharge: 2012-03-03 | Disposition: A | Discharge status: Home / Self Care | Payer: 59 | Source: Ambulatory Visit | Attending: Obstetrics & Gynecology | Admitting: Obstetrics & Gynecology

## 2012-03-03 DIAGNOSIS — Z1231 Encounter for screening mammogram for malignant neoplasm of breast: Secondary | ICD-10-CM

## 2012-03-05 ENCOUNTER — Ambulatory Visit (INDEPENDENT_AMBULATORY_CARE_PROVIDER_SITE_OTHER): Payer: 59 | Admitting: Ophthalmology

## 2012-03-05 DIAGNOSIS — H33309 Unspecified retinal break, unspecified eye: Secondary | ICD-10-CM

## 2012-03-19 ENCOUNTER — Ambulatory Visit (INDEPENDENT_AMBULATORY_CARE_PROVIDER_SITE_OTHER): Payer: 59 | Admitting: Ophthalmology

## 2012-03-19 DIAGNOSIS — H33309 Unspecified retinal break, unspecified eye: Secondary | ICD-10-CM

## 2012-04-04 ENCOUNTER — Encounter: Payer: Self-pay | Admitting: Endocrinology

## 2012-04-04 ENCOUNTER — Ambulatory Visit (INDEPENDENT_AMBULATORY_CARE_PROVIDER_SITE_OTHER): Payer: 59 | Admitting: Endocrinology

## 2012-04-04 VITALS — BP 112/82 | HR 88 | Temp 98.7°F | Ht 68.0 in | Wt 277.0 lb

## 2012-04-04 DIAGNOSIS — H6690 Otitis media, unspecified, unspecified ear: Secondary | ICD-10-CM

## 2012-04-04 MED ORDER — CEFUROXIME AXETIL 250 MG PO TABS: 250.0000 mg | ORAL_TABLET | Freq: Two times a day (BID) | ORAL | Status: AC

## 2012-04-04 NOTE — Progress Notes (Signed)
  Subjective:    Patient ID: [name], female    DOB: 1961/10/24, 51 y.o.   MRN: [id number]  HPI Pt states 1 week of moderate pain at the bifrontal and bimaxillary areas, and assoc nasal congestion.  No earache, but she has a prod-quality cough  Past Medical History  Diagnosis Date  . ALLERGIC RHINITIS 10/14/2008  . ANXIETY 10/14/2008  . ASTHMA 10/14/2008  . DEGENERATIVE JOINT DISEASE 10/14/2008  . GERD 10/14/2008  . HYPERLIPIDEMIA 10/14/2008  . INTERMITTENT VERTIGO 11/03/2008  . NEPHROLITHIASIS, HX OF 10/14/2008    Past Surgical History  Procedure Date  . S/p nasal surgery     x's 2  . Appendectomy   . Tonsillectomy   . S/p right knee surgery     x's 4- Cartilage torn  . S/p left knee surgery   . S/p left ulnar nerve surgery     x's 2  . Cesarean section     History   Social History  . Marital Status: Single    Spouse Name: N/A    Number of Children: N/A  . Years of Education: N/A   Occupational History  . Not on file.   Social History Main Topics  . Smoking status: Never Smoker   . Smokeless tobacco: Not on file  . Alcohol Use:   . Drug Use:   . Sexually Active:    Other Topics Concern  . Not on file   Social History Narrative  . No narrative on file    Current Outpatient Prescriptions on File Prior to Visit  Medication Sig Dispense Refill  . indomethacin (INDOCIN SR) 75 MG CR capsule Take 1 capsule (75 mg total) by mouth 2 (two) times daily with a meal. As needed  60 capsule  5  . pregabalin (LYRICA) 75 MG capsule Take 75 mg by mouth 2 (two) times daily.          Allergies  Allergen Reactions  . Codeine     itching    Family History  Problem Relation Age of Onset  . Diabetes Maternal Grandfather   . Cancer Maternal Grandfather     BP 112/82  Pulse 88  Temp(Src) 98.7 F (37.1 C) (Oral)  Ht 5\' 8"  (1.727 m)  Wt 277 lb (125.646 kg)  BMI 42.12 kg/m2  SpO2 97%    Review of Systems She also has dizziness, but no fever or LOC.        Objective:   Physical Exam VITAL SIGNS:  See vs page GENERAL: no distress head: no deformity eyes: no periorbital swelling, no proptosis external nose and ears are normal mouth: no lesion seen Right tm is normal, but the left is very red. NECK: There is no palpable thyroid enlargement.  No thyroid nodule is palpable.  No palpable lymphadenopathy at the anterior neck. LUNGS:  Clear to auscultation       Assessment & Plan:  Left AOM, new Dizziness, prob due toURI

## 2012-04-04 NOTE — Patient Instructions (Signed)
i have sent a prescription to your pharmacy, for an antibiotic pill Loratadine-d (non-prescription) will help your congestion. I hope you feel better soon.  If you don't feel better by next week, please call back.  Please call sooner if you get worse. 

## 2012-04-09 ENCOUNTER — Ambulatory Visit (INDEPENDENT_AMBULATORY_CARE_PROVIDER_SITE_OTHER): Payer: 59 | Admitting: Ophthalmology

## 2012-04-09 DIAGNOSIS — H33309 Unspecified retinal break, unspecified eye: Secondary | ICD-10-CM

## 2012-05-09 ENCOUNTER — Encounter: Payer: Self-pay | Admitting: Internal Medicine

## 2012-05-09 ENCOUNTER — Ambulatory Visit (INDEPENDENT_AMBULATORY_CARE_PROVIDER_SITE_OTHER): Payer: 59 | Admitting: Internal Medicine

## 2012-05-09 VITALS — BP 112/78 | HR 73 | Temp 98.5°F | Ht 68.0 in | Wt 278.8 lb

## 2012-05-09 DIAGNOSIS — J45909 Unspecified asthma, uncomplicated: Secondary | ICD-10-CM

## 2012-05-09 DIAGNOSIS — J309 Allergic rhinitis, unspecified: Secondary | ICD-10-CM

## 2012-05-09 DIAGNOSIS — R42 Dizziness and giddiness: Secondary | ICD-10-CM

## 2012-05-09 DIAGNOSIS — F411 Generalized anxiety disorder: Secondary | ICD-10-CM

## 2012-05-09 MED ORDER — MECLIZINE HCL 12.5 MG PO TABS: 12.5000 mg | ORAL_TABLET | Freq: Three times a day (TID) | ORAL | Status: AC | PRN

## 2012-05-09 MED ORDER — AZITHROMYCIN 250 MG PO TABS: ORAL_TABLET | ORAL | Status: AC

## 2012-05-09 NOTE — Progress Notes (Signed)
Subjective:    Patient ID: [name], female    DOB: 06-Dec-1960, 51 y.o.   MRN: [id number]  HPI  Here after being tx last mo with sinus/ear issues with antibx, but no fever, ST, cough but has occas HA , but the worst part is poisitional dizzinsess where it comes like a wave, has to grab or wall or feel like she will fall over, not lightheaded or syncope.  Pt denies chest pain, increased sob or doe, wheezing, orthopnea, PND, increased LE swelling, palpitations,  syncope.  Pt denies new neurological symptoms such as new headache, or facial or extremity weakness or numbness.   Pt denies polydipsia, polyuria.  Had allergy tx in the past with immunotherapy shots for yrs with good results recently.  No falls or injury.  No bruoising or bleeding. Denies worsening depressive symptoms, suicidal ideation, or panic, though has ongoing anxiety, not increased recently.  Has had vertigo in the past, with St and saw ENT yrs ago.   Past Medical History  Diagnosis Date  . ALLERGIC RHINITIS 10/14/2008  . ANXIETY 10/14/2008  . ASTHMA 10/14/2008  . DEGENERATIVE JOINT DISEASE 10/14/2008  . GERD 10/14/2008  . HYPERLIPIDEMIA 10/14/2008  . INTERMITTENT VERTIGO 11/03/2008  . NEPHROLITHIASIS, HX OF 10/14/2008   Past Surgical History  Procedure Date  . S/p nasal surgery     x's 2  . Appendectomy   . Tonsillectomy   . S/p right knee surgery     x's 4- Cartilage torn  . S/p left knee surgery   . S/p left ulnar nerve surgery     x's 2  . Cesarean section     reports that she has never smoked. She does not have any smokeless tobacco history on file. Her alcohol and drug histories not on file. family history includes Cancer in her maternal grandfather and Diabetes in her maternal grandfather. Allergies  Allergen Reactions  . Codeine     itching   Current Outpatient Prescriptions on File Prior to Visit  Medication Sig Dispense Refill  . indomethacin (INDOCIN SR) 75 MG CR capsule Take 1 capsule (75 mg total) by  mouth 2 (two) times daily with a meal. As needed  60 capsule  5  . pregabalin (LYRICA) 75 MG capsule Take 75 mg by mouth 2 (two) times daily.         Review of Systems Review of Systems  Constitutional: Negative for diaphoresis and unexpected weight change.  HENT: Negative for drooling and tinnitus.   Eyes: Negative for photophobia and visual disturbance.  Respiratory: Negative for choking and stridor.   Gastrointestinal: Negative for vomiting and blood in stool.  Musculoskeletal: Negative for gait problem. except for the above Skin: Negative for color change and wound.  Neurological: Negative for tremors and numbness.    Objective:   Physical Exam BP 112/78  Pulse 73  Temp 98.5 F (36.9 C) (Oral)  Ht 5\' 8"  (1.727 m)  Wt 278 lb 12.8 oz (126.463 kg)  BMI 42.39 kg/m2  SpO2 97% Physical Exam  VS noted Constitutional: Pt appears well-developed and well-nourished.  HENT: Head: Normocephalic.  Right Ear: External ear normal.  Left Ear: External ear normal.  Bilat tm's mild erythema.  Sinus nontender.  Pharynx mild erythema Eyes: Conjunctivae and EOM are normal. Pupils are equal, round, and reactive to light.  Neck: Normal range of motion. Neck supple.  Cardiovascular: Normal rate and regular rhythm.   Pulmonary/Chest: Effort normal and breath sounds normal.  Neurological: Pt is  alert. Not confused, cn 2-12 intact, motor/gait intact Skin: Skin is warm. No erythema.  Psychiatric: Pt behavior is normal. Thought content normal. tense demeanor, dysphoric affect    Assessment & Plan:

## 2012-05-09 NOTE — Patient Instructions (Addendum)
Take all new medications as prescribed - the antibiotic, and antivert (meclizine) as  Needed for the vertigo Continue all other medications as before Please also take claritic D  And mucinex OTC twice per day Your EKG was ok today

## 2012-05-09 NOTE — Assessment & Plan Note (Addendum)
Suspect related to inner ear problem/eustachain valve though has only very mild allergy nasal symtpoms at best;  For zpack x 1, claritin D otc, mucinex and antivert prn, also for valsalva bid prn as well; ECG reviewed as per emr

## 2012-05-10 ENCOUNTER — Encounter: Payer: Self-pay | Admitting: Internal Medicine

## 2012-05-10 NOTE — Assessment & Plan Note (Signed)
To re-start the claritin D asd,  to f/u any worsening symptoms or concerns

## 2012-05-10 NOTE — Assessment & Plan Note (Signed)
stable overall by hx and exam, most recent data reviewed with pt, and pt to continue medical treatment as before SpO2 Readings from Last 3 Encounters:  05/09/12 97%  04/04/12 97%  11/14/11 98%

## 2012-05-10 NOTE — Assessment & Plan Note (Signed)
stable overall by hx and exam, , and pt to continue medical treatment as before, declines change in tx

## 2012-06-04 ENCOUNTER — Other Ambulatory Visit: Payer: Self-pay | Admitting: Internal Medicine

## 2012-07-15 ENCOUNTER — Ambulatory Visit (INDEPENDENT_AMBULATORY_CARE_PROVIDER_SITE_OTHER): Payer: 59 | Admitting: Physician Assistant

## 2012-07-15 VITALS — BP 128/80 | HR 85 | Temp 98.3°F | Resp 16 | Ht 67.5 in | Wt 276.0 lb

## 2012-07-15 DIAGNOSIS — R300 Dysuria: Secondary | ICD-10-CM

## 2012-07-15 DIAGNOSIS — R350 Frequency of micturition: Secondary | ICD-10-CM

## 2012-07-15 LAB — POCT UA - MICROSCOPIC ONLY
Casts, Ur, LPF, POC: NEGATIVE
Yeast, UA: NEGATIVE

## 2012-07-15 LAB — POCT URINALYSIS DIPSTICK
Blood, UA: NEGATIVE
Ketones, UA: NEGATIVE
Protein, UA: NEGATIVE
Spec Grav, UA: >=1.03
pH, UA: 5

## 2012-07-15 MED ORDER — CIPROFLOXACIN HCL 500 MG PO TABS: 500.0000 mg | ORAL_TABLET | Freq: Two times a day (BID) | ORAL | Status: AC

## 2012-07-15 NOTE — Progress Notes (Signed)
  Subjective:    Patient ID: [name], female    DOB: 05-11-1961, 51 y.o.   MRN: [id number]  HPI 51 year old female presents with 5 day history of urinary frequency, hesitation, dysuria, and suprapubic pressure. Noticed these symptoms last week but seemed like they improved until last night when she noticed some hematuria.  Denies nausea, vomiting, flank pain, fever, or chills.  She is an otherwise healthy individual. Does have a history of UTI's but it has been "years" since her last.     Review of Systems  All other systems reviewed and are negative.       Objective:   Physical Exam  Constitutional: She is oriented to person, place, and time. She appears well-developed and well-nourished.  HENT:  Head: Normocephalic and atraumatic.  Right Ear: External ear normal.  Left Ear: External ear normal.  Eyes: Conjunctivae are normal.  Neck: Normal range of motion.  Cardiovascular: Normal rate, regular rhythm and normal heart sounds.   Pulmonary/Chest: Effort normal and breath sounds normal.  Abdominal: Soft. Bowel sounds are normal. There is tenderness (suprapubic, no CVA tenderness bilaterally). There is no rebound and no guarding.  Musculoskeletal: Normal range of motion.  Neurological: She is alert and oriented to person, place, and time.  Psychiatric: She has a normal mood and affect. Her behavior is normal. Judgment and thought content normal.     Results for orders placed in visit on 07/15/12  POCT URINALYSIS DIPSTICK      Component Value Range   Color, UA yellow     Clarity, UA clear     Glucose, UA neg     Bilirubin, UA small     Ketones, UA neg     Spec Grav, UA >=1.030     Blood, UA neg     pH, UA 5.0     Protein, UA neg     Urobilinogen, UA 0.2     Nitrite, UA neg     Leukocytes, UA Negative    POCT UA - MICROSCOPIC ONLY      Component Value Range   WBC, Ur, HPF, POC 3-6     RBC, urine, microscopic neg     Bacteria, U Microscopic trace     Mucus, UA trace      Epithelial cells, urine per micros 0-1     Crystals, Ur, HPF, POC neg     Casts, Ur, LPF, POC neg     Yeast, UA neg          Assessment & Plan:   1. Urinary frequency  POCT urinalysis dipstick, POCT UA - Microscopic Only  Will treat for UTI based on patient's symptoms Urine culture sent. Follow up if symptoms worsen or fail to improve.

## 2012-07-17 LAB — URINE CULTURE: Colony Count: 45000

## 2012-08-11 ENCOUNTER — Ambulatory Visit (INDEPENDENT_AMBULATORY_CARE_PROVIDER_SITE_OTHER): Payer: 59 | Admitting: Ophthalmology

## 2012-08-23 ENCOUNTER — Encounter: Payer: Self-pay | Admitting: Family Medicine

## 2012-08-23 ENCOUNTER — Ambulatory Visit (INDEPENDENT_AMBULATORY_CARE_PROVIDER_SITE_OTHER): Payer: 59 | Admitting: Family Medicine

## 2012-08-23 VITALS — BP 130/88 | Temp 98.3°F | Wt 279.0 lb

## 2012-08-23 DIAGNOSIS — J209 Acute bronchitis, unspecified: Secondary | ICD-10-CM

## 2012-08-23 MED ORDER — BENZONATATE 100 MG PO CAPS: 100.0000 mg | ORAL_CAPSULE | Freq: Three times a day (TID) | ORAL | Status: DC | PRN

## 2012-08-23 MED ORDER — AZITHROMYCIN 250 MG PO TABS: ORAL_TABLET | ORAL | Status: DC

## 2012-08-23 NOTE — Progress Notes (Signed)
  Subjective:    Patient ID: [name], female    DOB: 1961/08/18, 51 y.o.   MRN: [id number]  HPI CC: cough  3 wk h/o cough, now productive.  Coughing fits.  Blows nose constantly, leads to some dizziness.  Ears feel congested.  + post tussive gagging.  occasional diaphoresis.  Some wheezing noted.  + HA.  + PNdrainage.  Cough keeping her up at night.  Has tried mucinex D, delsym, honey.  No fevers/chill,s abd pain, n/v, ear or tooth pain, ST.  No sick contacts at home.  Coworker sick prior to her. No smokers at home. No h/o COPD.  States has ben treated in past for asthma.  Past Medical History  Diagnosis Date  . ALLERGIC RHINITIS 10/14/2008  . ANXIETY 10/14/2008  . ASTHMA 10/14/2008  . DEGENERATIVE JOINT DISEASE 10/14/2008  . GERD 10/14/2008  . HYPERLIPIDEMIA 10/14/2008  . INTERMITTENT VERTIGO 11/03/2008  . NEPHROLITHIASIS, HX OF 10/14/2008     Review of Systems Per HPI    Objective:   Physical Exam  Vitals reviewed. Constitutional: She appears well-developed and well-nourished. No distress.  HENT:  Head: Normocephalic and atraumatic.  Right Ear: Hearing, tympanic membrane, external ear and ear canal normal.  Left Ear: Hearing, tympanic membrane, external ear and ear canal normal.  Nose: No mucosal edema or rhinorrhea. Right sinus exhibits no maxillary sinus tenderness and no frontal sinus tenderness. Left sinus exhibits no maxillary sinus tenderness and no frontal sinus tenderness.  Mouth/Throat: Uvula is midline, oropharynx is clear and moist and mucous membranes are normal. No oropharyngeal exudate, posterior oropharyngeal edema, posterior oropharyngeal erythema or tonsillar abscesses.  Eyes: Conjunctivae normal and EOM are normal. Pupils are equal, round, and reactive to light. No scleral icterus.  Neck: Normal range of motion. Neck supple.  Cardiovascular: Normal rate, regular rhythm, normal heart sounds and intact distal pulses.   No murmur heard. Pulmonary/Chest:  Effort normal and breath sounds normal. No respiratory distress. She has no wheezes. She has no rales.  Lymphadenopathy:    She has no cervical adenopathy.  Skin: Skin is warm and dry. No rash noted.       Assessment & Plan:

## 2012-08-23 NOTE — Patient Instructions (Signed)
I think you have bronchitis - treat with zpack and tessalon perls - swallow, don't chew.  Bronchitis Bronchitis is the body's way of reacting to injury and/or infection (inflammation) of the bronchi. Bronchi are the air tubes that extend from the windpipe into the lungs. If the inflammation becomes severe, it may cause shortness of breath. CAUSES  Inflammation may be caused by:  A virus.  Germs (bacteria).  Dust.  Allergens.  Pollutants and many other irritants. The cells lining the bronchial tree are covered with tiny hairs (cilia). These constantly beat upward, away from the lungs, toward the mouth. This keeps the lungs free of pollutants. When these cells become too irritated and are unable to do their job, mucus begins to develop. This causes the characteristic cough of bronchitis. The cough clears the lungs when the cilia are unable to do their job. Without either of these protective mechanisms, the mucus would settle in the lungs. Then you would develop pneumonia. Smoking is a common cause of bronchitis and can contribute to pneumonia. Stopping this habit is the single most important thing you can do to help yourself. TREATMENT   Your caregiver may prescribe an antibiotic if the cough is caused by bacteria. Also, medicines that open up your airways make it easier to breathe. Your caregiver may also recommend or prescribe an expectorant. It will loosen the mucus to be coughed up. Only take over-the-counter or prescription medicines for pain, discomfort, or fever as directed by your caregiver.  Removing whatever causes the problem (smoking, for example) is critical to preventing the problem from getting worse.  Cough suppressants may be prescribed for relief of cough symptoms.  Inhaled medicines may be prescribed to help with symptoms now and to help prevent problems from returning.  For those with recurrent (chronic) bronchitis, there may be a need for steroid medicines. SEEK  IMMEDIATE MEDICAL CARE IF:   During treatment, you develop more pus-like mucus (purulent sputum).  You have a fever.  Your baby is older than 3 months with a rectal temperature of 102 F (38.9 C) or higher.  Your baby is 6 months old or younger with a rectal temperature of 100.4 F (38 C) or higher.  You become progressively more ill.  You have increased difficulty breathing, wheezing, or shortness of breath. It is necessary to seek immediate medical care if you are elderly or sick from any other disease. MAKE SURE YOU:   Understand these instructions.  Will watch your condition.  Will get help right away if you are not doing well or get worse. Document Released: 10/29/2005 Document Revised: 01/21/2012 Document Reviewed: 09/07/2008 [facility] Patient Information 2013 [place], [place].

## 2012-08-23 NOTE — Assessment & Plan Note (Signed)
Acute bronchitis - given duration and progression, treat with zpack and tessalon perls given codeine allergy. Pt agrees with plan.

## 2012-09-10 ENCOUNTER — Ambulatory Visit (INDEPENDENT_AMBULATORY_CARE_PROVIDER_SITE_OTHER): Payer: 59 | Admitting: Ophthalmology

## 2012-09-10 DIAGNOSIS — H33309 Unspecified retinal break, unspecified eye: Secondary | ICD-10-CM

## 2012-09-10 DIAGNOSIS — H43819 Vitreous degeneration, unspecified eye: Secondary | ICD-10-CM

## 2012-10-13 ENCOUNTER — Other Ambulatory Visit (INDEPENDENT_AMBULATORY_CARE_PROVIDER_SITE_OTHER): Payer: 59

## 2012-10-13 ENCOUNTER — Encounter: Payer: Self-pay | Admitting: Internal Medicine

## 2012-10-13 ENCOUNTER — Ambulatory Visit (INDEPENDENT_AMBULATORY_CARE_PROVIDER_SITE_OTHER): Payer: 59 | Admitting: Internal Medicine

## 2012-10-13 VITALS — BP 112/82 | HR 88 | Temp 99.0°F | Ht 69.0 in | Wt 283.0 lb

## 2012-10-13 DIAGNOSIS — R109 Unspecified abdominal pain: Secondary | ICD-10-CM | POA: Insufficient documentation

## 2012-10-13 DIAGNOSIS — Z0000 Encounter for general adult medical examination without abnormal findings: Secondary | ICD-10-CM

## 2012-10-13 DIAGNOSIS — R10816 Epigastric abdominal tenderness: Secondary | ICD-10-CM | POA: Insufficient documentation

## 2012-10-13 DIAGNOSIS — G8929 Other chronic pain: Secondary | ICD-10-CM | POA: Insufficient documentation

## 2012-10-13 LAB — URINALYSIS, ROUTINE W REFLEX MICROSCOPIC
Ketones, ur: NEGATIVE
Leukocytes, UA: NEGATIVE
Specific Gravity, Urine: >=1.03 (ref 1.000–1.030)
Total Protein, Urine: NEGATIVE
Urine Glucose: NEGATIVE
pH: 6 (ref 5.0–8.0)

## 2012-10-13 LAB — CBC WITH DIFFERENTIAL/PLATELET
Basophils Relative: 0.5 % (ref 0.0–3.0)
Eosinophils Relative: 2 % (ref 0.0–5.0)
HCT: 44.1 % (ref 36.0–46.0)
Hemoglobin: 14.6 g/dL (ref 12.0–15.0)
Lymphs Abs: 2.4 10*3/uL (ref 0.7–4.0)
Monocytes Relative: 7.6 % (ref 3.0–12.0)
Neutro Abs: 5.1 10*3/uL (ref 1.4–7.7)
RDW: 13.4 % (ref 11.5–14.6)
WBC: 8.3 10*3/uL (ref 4.5–10.5)

## 2012-10-13 LAB — HEPATIC FUNCTION PANEL
Albumin: 3.9 g/dL (ref 3.5–5.2)
Alkaline Phosphatase: 70 U/L (ref 39–117)

## 2012-10-13 LAB — H. PYLORI ANTIBODY, IGG: H Pylori IgG: NEGATIVE

## 2012-10-13 LAB — BASIC METABOLIC PANEL
Calcium: 9.1 mg/dL (ref 8.4–10.5)
GFR: 124.41 mL/min (ref >60.00)
Glucose, Bld: 102 mg/dL — ABNORMAL HIGH (ref 70–99)
Sodium: 141 mEq/L (ref 135–145)

## 2012-10-13 MED ORDER — CEPHALEXIN 500 MG PO CAPS: 500.0000 mg | ORAL_CAPSULE | Freq: Four times a day (QID) | ORAL | Status: DC

## 2012-10-13 MED ORDER — OMEPRAZOLE 20 MG PO CPDR: 20.0000 mg | DELAYED_RELEASE_CAPSULE | Freq: Every day | ORAL | Status: DC

## 2012-10-13 MED ORDER — TRAMADOL HCL 50 MG PO TABS
50.0000 mg | ORAL_TABLET | Freq: Three times a day (TID) | ORAL | Status: DC | PRN
Start: 2012-10-13 — End: 2013-06-24

## 2012-10-13 NOTE — Progress Notes (Signed)
Subjective:    Patient ID: [name], female    DOB: 06/18/1961, 51 y.o.   MRN: [id number]  HPI  Here to f/u;   Having 3 wks achy (occas sharp) abd pains, off and on (more on then off), mild to mod, not really getting worse but not improving;  Location is variable - sometimes mid upper and sometimes lower, assoc with bloating/swelling, no radiation to the back, no n/v, unaware of any fever, chills;  No change in bowels  - no constipation, in fact has chronically loose stools, no blood, no wt loss.  Denies urinary symptoms such as dysuria, frequency, urgency,or hematuria, but did have to urinate 4 times at work last wed morning which is unusual.   Had the flu shot at work.   Has had several abd surguries incluing appy, and exploratory lap with finding of ovary cyst (removed  - more than 10 yrs),  No hx of adhesions.  Still has GB.  Nothing makes better or worse, even position change, eating or fasting. + hx of renal stone, - last about 18 yrs ago.   Still regularly takes the indocin at least once per day for recurring chronic neck pain s/p disc surgury x 2.  Denies worsening depressive symptoms, suicidal ideation, or panic, and denies worsening anxiety.  Having irreg periods/perimenopausal Past Medical History  Diagnosis Date  . ALLERGIC RHINITIS 10/14/2008  . ANXIETY 10/14/2008  . ASTHMA 10/14/2008  . DEGENERATIVE JOINT DISEASE 10/14/2008  . GERD 10/14/2008  . HYPERLIPIDEMIA 10/14/2008  . INTERMITTENT VERTIGO 11/03/2008  . NEPHROLITHIASIS, HX OF 10/14/2008   Past Surgical History  Procedure Date  . S/p nasal surgery     x's 2  . Appendectomy   . Tonsillectomy   . S/p right knee surgery     x's 4- Cartilage torn  . S/p left knee surgery   . S/p left ulnar nerve surgery     x's 2  . Cesarean section     reports that she has never smoked. She does not have any smokeless tobacco history on file. Her alcohol and drug histories not on file. family history includes Cancer in her maternal  grandfather and Diabetes in her maternal grandfather. Allergies  Allergen Reactions  . Codeine     itching   Current Outpatient Prescriptions on File Prior to Visit  Medication Sig Dispense Refill  . indomethacin (INDOCIN SR) 75 MG CR capsule TAKE 1 CAPSULE (75 MG TOTAL) BY MOUTH 2 (TWO) TIMES DAILY WITH A MEAL. AS NEEDED  60 capsule  5  . pregabalin (LYRICA) 75 MG capsule Take 75 mg by mouth 2 (two) times daily.         Review of Systems  Constitutional: Negative for diaphoresis and unexpected weight change.  HENT: Negative for tinnitus.   Eyes: Negative for photophobia and visual disturbance.  Respiratory: Negative for choking and stridor.   Gastrointestinal: Negative for vomiting and blood in stool.  Genitourinary: Negative for hematuria and decreased urine volume.  Musculoskeletal: Negative for gait problem.  Skin: Negative for color change and wound.  Neurological: Negative for tremors and numbness.  Psychiatric/Behavioral: Negative for decreased concentration. The patient is not hyperactive.       Objective:   Physical Exam BP 112/82  Pulse 88  Temp 99 F (37.2 C) (Oral)  Ht 5\' 9"  (1.753 m)  Wt 283 lb (128.368 kg)  BMI 41.79 kg/m2  SpO2 96% Physical Exam  VS noted Constitutional: Pt appears well-developed and well-nourished. Lavella Lemons  HENT: Head: Normocephalic.  Right Ear: External ear normal.  Left Ear: External ear normal.  Eyes: Conjunctivae and EOM are normal. Pupils are equal, round, and reactive to light.  Neck: Normal range of motion. Neck supple.  Cardiovascular: Normal rate and regular rhythm.   Pulmonary/Chest: Effort normal and breath sounds normal.  Abd:  Soft, non-distended, + BS with tender epigstrium and low mid abd tender without guarding or rebound Neurological: Pt is alert. Not confused  Skin: Skin is warm. No erythema.  Psychiatric: Pt behavior is normal. Thought content normal.     Assessment & Plan:

## 2012-10-13 NOTE — Assessment & Plan Note (Addendum)
?   Incidental gastritis vs other, to hold indocin prn, empiric PPI,  to f/u any worsening symptoms or concerns

## 2012-10-13 NOTE — Patient Instructions (Addendum)
OK to stop the indocin Take all new medications as prescribed - the tramadol for pain, and the prilosec for the stomach (for ? Of gastritis), and the antibiotic for possible UTI The prilosec should be taken at least for one month, then you can try stopping, though if it helps, you may wish to continue it Continue all other medications as before Please go to LAB in the Basement for the blood and/or urine tests to be done today You will be contacted by phone if any changes need to be made immediately.  Otherwise, you will receive a letter about your results with an explanation Please remember to sign up for My Chart at your earliest convenience, as this will be important to you in the future with finding out test results. Please return in 3 mo with Lab testing done 3-5 days before

## 2012-10-13 NOTE — Assessment & Plan Note (Signed)
Low mid abd - ? Cystitis - seems likely with hx, low grade temp - for urine studies, empiric cephalexin

## 2012-10-14 ENCOUNTER — Encounter: Payer: Self-pay | Admitting: Internal Medicine

## 2012-10-14 LAB — URINE CULTURE: Colony Count: 4000

## 2012-10-15 ENCOUNTER — Encounter: Payer: Self-pay | Admitting: Internal Medicine

## 2012-10-15 NOTE — Assessment & Plan Note (Signed)
For ultram for ongoing knee and back pain, cont efforts at wt loss, d/c indocin

## 2012-10-16 ENCOUNTER — Telehealth: Payer: Self-pay

## 2012-10-16 MED ORDER — CELECOXIB 200 MG PO CAPS: 200.0000 mg | ORAL_CAPSULE | Freq: Two times a day (BID) | ORAL | Status: DC | PRN

## 2012-10-16 NOTE — Telephone Encounter (Signed)
This is possible, but the only way to know is to stop the tramadol, and hopefully start the celebrex 

## 2012-10-16 NOTE — Telephone Encounter (Signed)
Patient informed celebrex sent in.  The patient informed that she has been having HA's, nausea and some itching since taking the tramadol, is this due to the medication ?

## 2012-10-16 NOTE — Telephone Encounter (Signed)
Patient informed. 

## 2012-10-16 NOTE — Telephone Encounter (Signed)
Pt called stating that Tramadol is not helping with pain. Pt says MD discussed possibility of prescribing Celebrex and pt would prefer to proceed with this medication, please advise.

## 2012-10-16 NOTE — Telephone Encounter (Signed)
Done erx 

## 2012-10-16 NOTE — Telephone Encounter (Signed)
This is possible, but the only way to know is to stop the tramadol, and hopefully start the celebrex

## 2012-12-31 ENCOUNTER — Other Ambulatory Visit: Payer: Self-pay | Admitting: Internal Medicine

## 2013-03-17 ENCOUNTER — Other Ambulatory Visit: Payer: Self-pay | Admitting: *Deleted

## 2013-03-17 ENCOUNTER — Other Ambulatory Visit: Payer: Self-pay

## 2013-03-17 DIAGNOSIS — Z1231 Encounter for screening mammogram for malignant neoplasm of breast: Secondary | ICD-10-CM

## 2013-03-30 ENCOUNTER — Other Ambulatory Visit: Payer: Self-pay | Admitting: Internal Medicine

## 2013-04-13 ENCOUNTER — Ambulatory Visit
Admission: RE | Admit: 2013-04-13 | Discharge: 2013-04-13 | Disposition: A | Discharge status: Home / Self Care | Payer: 59 | Source: Ambulatory Visit

## 2013-04-13 DIAGNOSIS — Z1231 Encounter for screening mammogram for malignant neoplasm of breast: Secondary | ICD-10-CM

## 2013-05-23 ENCOUNTER — Other Ambulatory Visit: Payer: Self-pay | Admitting: Internal Medicine

## 2013-06-24 ENCOUNTER — Ambulatory Visit (INDEPENDENT_AMBULATORY_CARE_PROVIDER_SITE_OTHER): Payer: 59 | Admitting: Internal Medicine

## 2013-06-24 ENCOUNTER — Encounter: Payer: Self-pay | Admitting: Internal Medicine

## 2013-06-24 VITALS — BP 122/80 | HR 84 | Temp 98.1°F | Ht 69.0 in | Wt 301.0 lb

## 2013-06-24 DIAGNOSIS — M7711 Lateral epicondylitis, right elbow: Secondary | ICD-10-CM | POA: Insufficient documentation

## 2013-06-24 DIAGNOSIS — M7710 Lateral epicondylitis, unspecified elbow: Secondary | ICD-10-CM

## 2013-06-24 MED ORDER — INDOMETHACIN ER 75 MG PO CPCR: ORAL_CAPSULE | ORAL | Status: DC

## 2013-06-24 MED ORDER — PREDNISONE 10 MG PO TABS: ORAL_TABLET | ORAL | Status: DC

## 2013-06-24 MED ORDER — TRAMADOL HCL 50 MG PO TABS: 50.0000 mg | ORAL_TABLET | Freq: Three times a day (TID) | ORAL | Status: DC | PRN

## 2013-06-24 NOTE — Assessment & Plan Note (Addendum)
Mild to mod, for predpac course, pain control, refer sports med for prob kenalog,  to f/u any worsening symptoms or concerns, for forearm band during work hours

## 2013-06-24 NOTE — Patient Instructions (Signed)
Please take all new medication as prescribed - the prednisone Please continue all other medications as before, including the tramadol Please wear the forearm band (OTC at drug store) to the right arm just below the elbow during work hours Please return tomorrow to see Dr Katrinka Blazing for the cortisone shot

## 2013-06-24 NOTE — Progress Notes (Signed)
  Subjective:    Patient ID: [name], female    DOB: 02-22-1961, 52 y.o.   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  OTC nsaid no longer helping and swelling persists to area.   Past Medical History  Diagnosis Date  . ALLERGIC RHINITIS 10/14/2008  . ANXIETY 10/14/2008  . ASTHMA 10/14/2008  . DEGENERATIVE JOINT DISEASE 10/14/2008  . GERD 10/14/2008  . HYPERLIPIDEMIA 10/14/2008  . INTERMITTENT VERTIGO 11/03/2008  . NEPHROLITHIASIS, HX OF 10/14/2008   Past Surgical History  Procedure Laterality Date  . S/p nasal surgery      x's 2  . Appendectomy    . Tonsillectomy    . S/p right knee surgery      x's 4- Cartilage torn  . S/p left knee surgery    . S/p left ulnar nerve surgery      x's 2  . Cesarean section      reports that she has never smoked. She does not have any smokeless tobacco history on file. Her alcohol and drug histories are not on file. family history includes Cancer in her maternal grandfather; Diabetes in her maternal grandfather. Allergies  Allergen Reactions  . Codeine     itching   Current Outpatient Prescriptions on File Prior to Visit  Medication Sig Dispense Refill  . CELEBREX 200 MG capsule TAKE 1 CAPSULE (200 MG TOTAL) BY MOUTH 2 (TWO) TIMES DAILY AS NEEDED FOR PAIN.  180 capsule  0  . omeprazole (PRILOSEC) 20 MG capsule Take 1 capsule (20 mg total) by mouth daily.  30 capsule  3  . pregabalin (LYRICA) 75 MG capsule Take 75 mg by mouth 2 (two) times daily.         No current facility-administered medications on file prior to visit.   Review of Systems All otherwise neg per pt     Objective:   Physical Exam BP 122/80  Pulse 84  Temp(Src) 98.1 F (36.7 C) (Oral)  Ht 5\' 9"  (1.753 m)  Wt 301 lb (136.533 kg)  BMI 44.43 kg/m2  SpO2 97% VS noted,   Constitutional: Pt appears well-developed and well-nourished.  HENT: Head: NCAT.  Right Ear: External ear normal.  Left Ear: External ear normal.  Eyes: Conjunctivae and EOM are normal. Pupils are equal, round, and reactive to light.  Neck: Normal range of motion. Neck supple.  Cardiovascular: Normal rate and regular rhythm.   Pulmonary/Chest: Effort normal and breath sounds normal.  Right lateral epicondylar area with 1-2+ tender, swelling with faint erythema Neurological: Pt is alert. Not confused , motor 5/5 Skin: Skin is warm. No erythema.  Psychiatric: Pt behavior is normal. Thought content normal. mild nervous    Assessment & Plan:

## 2013-06-25 ENCOUNTER — Encounter: Payer: Self-pay | Admitting: *Deleted

## 2013-06-25 ENCOUNTER — Encounter: Payer: Self-pay | Admitting: Family Medicine

## 2013-06-25 ENCOUNTER — Ambulatory Visit (INDEPENDENT_AMBULATORY_CARE_PROVIDER_SITE_OTHER): Payer: 59 | Admitting: Family Medicine

## 2013-06-25 VITALS — BP 134/84 | HR 90 | Wt 300.0 lb

## 2013-06-25 DIAGNOSIS — M7710 Lateral epicondylitis, unspecified elbow: Secondary | ICD-10-CM

## 2013-06-25 DIAGNOSIS — M7711 Lateral epicondylitis, right elbow: Secondary | ICD-10-CM

## 2013-06-25 NOTE — Assessment & Plan Note (Signed)
Lateral Epicondylitis: Elbow anatomy was reviewed, and tendinopathy was explained.  Pt. given a formal rehab program from patient advisor.  Series of concentric and eccentric exercises should be done starting with no weight, work up to 1 lb, hammer, etc.  Use counterforce strap if working or using hands. Discussed wrist brace at night  Formal PT would be beneficial but patient declined at this time Emphasized stretching an cross-friction massage Emphasized proper palms up lifting biomechanics to unload ECRB Discussed icing protocol Continue indomethacin Return in 3 weeks if not doing better would consider starting Voltaren gel and nitroglycerin patches. Will re\re ultrasound at that time.

## 2013-06-25 NOTE — Patient Instructions (Signed)
Nice to meet you I did inject it today Lateral Epicondylitis: Try the stretches.  Continue the brace Wrist splint at night Icing 20 minutes 3 times daily Come back in 3 weeks if not better I have some more tricks.

## 2013-06-25 NOTE — Progress Notes (Signed)
I'm seeing this patient by the request  of:  Dr. 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 This is right-sided and has been at least 6-8 weeks per patient. Patient does not remember any significant injury to the area but has been doing more yard work and uses that hand primarily at work. Patient states that the pain is on the lateral aspect of the elbow and describes it as a chronic dull aching pain with sharp sensation from time to time. Patient denies any radiation, denies any numbness or tingling in the fingers. States it's worse when she extends her wrist or with cooking. States it felt better when she wore a brace that she got over-the-counter. Patient has tried indomethacin with minimal improvement. Patient the severity of 7/10. Patient states the pain does not wake her up at night but it does keep her from falling asleep.  Past medical, surgical, family and social history reviewed. Medications reviewed all in the electronic medical record.   Review of Systems: No headache, visual changes, nausea, vomiting, diarrhea, constipation, dizziness, abdominal pain, skin rash, fevers, chills, night sweats, weight loss, swollen lymph nodes, body aches, joint swelling, muscle aches, chest pain, shortness of breath, mood changes.   Objective:    Blood pressure 134/84, pulse 90, weight 300 lb (136.079 kg), SpO2 97.00%.   General: No apparent distress alert and oriented x3 mood and affect normal, dressed appropriately.  HEENT: Pupils equal, extraocular movements intact Respiratory: Patient's speak in full sentences and does not appear short of breath Cardiovascular: No lower extremity edema, non tender, no erythema Skin: Warm dry intact with no signs of infection or rash on extremities or on axial skeleton. Abdomen: Soft nontender Neuro: Cranial nerves II through XII are intact, neurovascularly intact in all  extremities with 2+ DTRs and 2+ pulses. Lymph: No lymphadenopathy of posterior or anterior cervical chain or axillae bilaterally.  Gait normal with good balance and coordination.  MSK: Non tender with full range of motion and good stability and symmetric strength and tone of shoulders,, hip, knee and ankles bilaterally.  Elbow: Right Unremarkable to inspection. Range of motion full pronation, supination, flexion, extension the patient does have pain with extension of the wrist. Strength is full to all of the above directions Stable to varus, valgus stress. Negative moving valgus stress test. Patient is severely tender to palpation at the origin of the wrist extensors on the lateral, eipcondyle. Ulnar nerve does not sublux. Negative cubital tunnel Tinel's. Patient does have pain with extension of the middle finger against resistance that same area of the lateral epicondyle.  Musculoskeletal ultrasound was performed and interpreted by Terrilee Files D.O.   Elbow:  Lateral epicondyle and common extensor tendon origin visualized.  Patient did have significant amount of hypoechoic changes showing that this like edema. No bulging noted off the bone. Patient did have small calcific changes of the tendon. No true tear appreciated.  Medial epicondyle and common flexor tendon origin visualized.  No edema, effusions, or avulsions seen. Ulnar nerve in cubital tunnel unremarkable. Olecranon and triceps insertion visualized and unremarkable without edema, effusion, or avulsion.  No signs olecranon bursitis. Power doppler signal normal.  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 Katrinka Blazing, injection of 3 cc of 0.5% Marcaine and 1 cc of Kenalog 40 mg/dL given by Antoine Primas, M  with ultrasound guidance. Pictures were taken showing good needle placement at the lateral epicondylar region. Patient had near full resolution of  pain immediately. Patient instructed to remain in clinic for 20 minutes afterwards, and to report any adverse reaction to me immediately.   Impression and Recommendations:     This case required medical decision making of moderate complexity.

## 2013-07-17 ENCOUNTER — Ambulatory Visit (INDEPENDENT_AMBULATORY_CARE_PROVIDER_SITE_OTHER): Payer: 59 | Admitting: Family Medicine

## 2013-07-17 ENCOUNTER — Encounter: Payer: Self-pay | Admitting: *Deleted

## 2013-07-17 ENCOUNTER — Encounter: Payer: Self-pay | Admitting: Family Medicine

## 2013-07-17 VITALS — BP 120/82 | HR 71 | Wt 301.0 lb

## 2013-07-17 DIAGNOSIS — M7710 Lateral epicondylitis, unspecified elbow: Secondary | ICD-10-CM

## 2013-07-17 DIAGNOSIS — M7711 Lateral epicondylitis, right elbow: Secondary | ICD-10-CM

## 2013-07-17 MED ORDER — NITROGLYCERIN 0.2 MG/HR TD PT24: MEDICATED_PATCH | TRANSDERMAL | Status: DC

## 2013-07-17 NOTE — Assessment & Plan Note (Addendum)
Patient has made 20-30% improvement. Patient was given a wrist splint and will wear this at all times Started nitroglycerin warned of potential side effects Discussed formal physical therapy again and she declined once more. The patient will try these interventions and then come back again in one month for further evaluation. Spent greater than 25 minutes with patient face-to-face and had greater than 50% of counseling including as described above in assessment and plan.

## 2013-07-17 NOTE — Progress Notes (Addendum)
  Subjective:    CC: Right elbow pain  HPI: Patient was seen previously back on June 25, 2013 and was diagnosed with right lateral epicondylitis. Patient did have significant hypoechoic changes seen on ultrasound near the common extensor tendon origin. Patient did have an injection of corticosteroids in the area under ultrasound guidance at that time. Since then patient has been doing the exercises that is giving her regularly, continuing anti-inflammatories, as well as icing. Patient has also been wearing the wrist splint at night. Patient states she is about 30% better. Patient states she still has intermittent pain from time to time and this does still have a dull aching sensation. Patient states that she feels her strength is getting somewhat better. Patient denies any nighttime awakening. Patient still states though that it can affect her activities of daily living such as combing her hair or gross dexterity type movements that are repetitive. Denies any new symptoms. Denies any fevers or chills.  Past medical history, Surgical history, Family history not pertinant except as noted below, Social history, Allergies, and medications have been entered into the medical record, reviewed, and no changes needed.   Review of Systems: No fevers, chills, night sweats, weight loss, chest pain, or shortness of breath.   Objective:   Blood pressure 120/82, pulse 71, weight 301 lb (136.533 kg), SpO2 98.00%.  General: Well Developed, well nourished, and in no acute distress.  Neuro: Alert and oriented x3, extra-ocular muscles intact, sensation grossly intact.  HEENT: Normocephalic, atraumatic, pupils equal round reactive to light, neck supple, no masses, no lymphadenopathy, thyroid nonpalpable.  Skin: Warm and dry, no rashes. Cardiac:  no lower extremity edema. Respiratory: Not using accessory muscles, speaking in full sentences. Abdominal: NT, soft Gait: Nonantlagic, good balance and  coordination Lymphatic: no lymphadenopathy in neck or axillae on palpation, non tender.  Musculoskeletal: Inspection and palpation of the right and left upper extremities including the shoulders and wrist are unremarkable with full range of motion and good muscle strength and tone. Inspection and palpation of the right and left lower extremities including the hips knees and ankles are unremarkable and nontender with full range of motion and good muscle strength and tone and are symmetric. Patient's right elbow shows no gross deformity. On palpation she still is tender to palpation over the lateral epicondylar region. Patient still has pain with extension of the middle finger against resistance at the lateral epicondylar region. Patient has good grip strength in his neurovascular intact distally. Patient does have full range of motion in flexion extension supination and pronation of the elbow. This is mildly better than previous exam. Contralateral elbow has full range of motion, nontender to palpation, and good strength and tone. Limited ultrasound that was performed by me today shows the patient does have decreasing hypoechoic changes surrounding the tendon. Patient does have more focal small calcific changes within the tendon itself but no true tear appreciated. There is some mild neovascularization which is seen this time compared to last time.  Impression and Recommendations:

## 2013-07-17 NOTE — Patient Instructions (Signed)
I am glad you are a little better Continue exercises! We will try the nitro patch Nitroglycerin Protocol   Apply 1/4 nitroglycerin patch to affected area daily.  Change position of patch within the affected area every 24 hours.  You may experience a headache during the first 1-2 weeks of using the patch, these should subside.  If you experience headaches after beginning nitroglycerin patch treatment, you may take your preferred over the counter pain reliever.  Another side effect of the nitroglycerin patch is skin irritation or rash related to patch adhesive.  Please notify our office if you develop more severe headaches or rash, and stop the patch.  Tendon healing with nitroglycerin patch may require 12 to 24 weeks depending on the extent of injury.  Men should not use if taking Viagra, Cialis, or Levitra.   Do not use if you have migraines or rosacea.   Continue icing Wear wrist splint at all times  Come back in 1 month.

## 2013-08-14 ENCOUNTER — Encounter: Payer: Self-pay | Admitting: Family Medicine

## 2013-08-14 ENCOUNTER — Ambulatory Visit (INDEPENDENT_AMBULATORY_CARE_PROVIDER_SITE_OTHER): Payer: 59 | Admitting: Family Medicine

## 2013-08-14 VITALS — BP 122/82 | HR 70 | Wt 304.0 lb

## 2013-08-14 DIAGNOSIS — M7711 Lateral epicondylitis, right elbow: Secondary | ICD-10-CM

## 2013-08-14 DIAGNOSIS — M7710 Lateral epicondylitis, unspecified elbow: Secondary | ICD-10-CM

## 2013-08-14 NOTE — Patient Instructions (Signed)
Continue exercises daily for another 2 weeks, then 3 times a week therafter Wear brace day and night for one more week then only at night for 2 weeks.  Wear nitro patch 1/4 patch for full 24 hours probably at least next month.  Come back in 1 month.

## 2013-08-14 NOTE — Assessment & Plan Note (Signed)
Patient has made 85-90% improvement at this time. Ultrasound confirms this. Patient will continue home exercises 3 times a week, we will do the bracing for another week per 24 hours and it only at night for 2 weeks. Discussed using a nitroglycerin patch still for the course of another month. Patient will continue with the icing as well. Patient will follow up in one month if she is not completely resolved of her pain.

## 2013-08-14 NOTE — Progress Notes (Signed)
  Subjective:    CC: Right elbow pain  HPI: Patient was seen previously back on June 25, 2013 and was diagnosed with right lateral epicondylitis. Had injection and was seen again 9/5.  Had 20% improvement, started nitro patch then and states no side effect. Patient states that she is continuing to wear the splint at night and during the day a regular basis, patient states she is approximately 85-90% better patient states that intermittently she can have some pain at the elbow but does respond to ice. Patient denies any nighttime awakening. . Denies any new symptoms. Denies any fevers or chills.  Past medical history, Surgical history, Family history not pertinant except as noted below, Social history, Allergies, and medications have been entered into the medical record, reviewed, and no changes needed.   Review of Systems: No fevers, chills, night sweats, weight loss, chest pain, or shortness of breath.   Objective:   Blood pressure 122/82, pulse 70, weight 304 lb (137.893 kg), SpO2 99.00%.  General: Well Developed, well nourished, and in no acute distress.  Neuro: Alert and oriented x3, extra-ocular muscles intact, sensation grossly intact.  HEENT: Normocephalic, atraumatic, pupils equal round reactive to light, neck supple, no masses, no lymphadenopathy, thyroid nonpalpable.  Skin: Warm and dry, no rashes. Cardiac:  no lower extremity edema. Respiratory: Not using accessory muscles, speaking in full sentences. Abdominal: NT, soft Gait: Nonantlagic, good balance and coordination Lymphatic: no lymphadenopathy in neck or axillae on palpation, non tender.  Musculoskeletal: Inspection and palpation of the right and left upper extremities including the shoulders and wrist are unremarkable with full range of motion and good muscle strength and tone. Inspection and palpation of the right and left lower extremities including the hips knees and ankles are unremarkable and nontender with full range  of motion and good muscle strength and tone and are symmetric. Patient's right elbow shows no gross deformity. On palpation she is nontender to palpation over the lateral epicondylar region. Patient still has pain with extension of the middle finger against resistance at the lateral epicondylar region but has gained considerable strength. Patient has good grip strength in his neurovascular intact distally. Patient does have full range of motion in flexion extension supination and pronation of the elbow. Contralateral elbow has full range of motion, nontender to palpation, and good strength and tone. Limited ultrasound that was performed by me today shows the patient only has minimal hypoechoic changes surrounding the tendon. Patient shows scar tissue in the area that was potentially a tear There is some mild neovascularization which is seen this time compared to last time.  Impression and Recommendations:

## 2013-09-07 ENCOUNTER — Ambulatory Visit (INDEPENDENT_AMBULATORY_CARE_PROVIDER_SITE_OTHER): Payer: 59 | Admitting: Ophthalmology

## 2013-09-07 DIAGNOSIS — H43819 Vitreous degeneration, unspecified eye: Secondary | ICD-10-CM

## 2013-09-07 DIAGNOSIS — H2510 Age-related nuclear cataract, unspecified eye: Secondary | ICD-10-CM

## 2013-09-07 DIAGNOSIS — H33309 Unspecified retinal break, unspecified eye: Secondary | ICD-10-CM

## 2013-09-11 ENCOUNTER — Ambulatory Visit (INDEPENDENT_AMBULATORY_CARE_PROVIDER_SITE_OTHER): Payer: 59 | Admitting: Ophthalmology

## 2013-09-11 ENCOUNTER — Ambulatory Visit: Payer: 59 | Admitting: Family Medicine

## 2013-09-16 ENCOUNTER — Other Ambulatory Visit: Payer: Self-pay | Admitting: Internal Medicine

## 2013-09-24 ENCOUNTER — Ambulatory Visit (INDEPENDENT_AMBULATORY_CARE_PROVIDER_SITE_OTHER): Payer: Self-pay | Admitting: Ophthalmology

## 2013-10-31 ENCOUNTER — Ambulatory Visit (INDEPENDENT_AMBULATORY_CARE_PROVIDER_SITE_OTHER): Payer: 59 | Admitting: Family Medicine

## 2013-10-31 ENCOUNTER — Encounter: Payer: Self-pay | Admitting: Family Medicine

## 2013-10-31 VITALS — BP 126/76 | HR 97 | Temp 99.6°F | Resp 17 | Wt 301.5 lb

## 2013-10-31 DIAGNOSIS — J019 Acute sinusitis, unspecified: Secondary | ICD-10-CM

## 2013-10-31 MED ORDER — PROMETHAZINE-DM 6.25-15 MG/5ML PO SYRP: 5.0000 mL | ORAL_SOLUTION | Freq: Four times a day (QID) | ORAL | Status: DC | PRN

## 2013-10-31 MED ORDER — AMOXICILLIN 875 MG PO TABS: 875.0000 mg | ORAL_TABLET | Freq: Two times a day (BID) | ORAL | Status: DC

## 2013-10-31 NOTE — Patient Instructions (Signed)
Follow up as needed Start the Amox twice daily- take w/ food Drink plenty of fluids REST! Use the cough syrup as needed- will cause drowsiness Call with any questions or concerns Hang in there! Happy Holidays!

## 2013-10-31 NOTE — Progress Notes (Signed)
   Subjective:    Patient ID: [name], female    DOB: 1961/03/15, 52 y.o.   MRN: [id number]  HPI URI- HA, 'a lot of congestion, a lot of drainage.  It's greenish yellow and has some blood in it'.  'coughing like crazy'.  Bilateral ear fullness.  + sinus pain.  No tooth pain.  No fevers but chills.  No N/V/D.  No known sick contacts.  sxs started ~3 weeks ago.   Review of Systems For ROS see HPI     Objective:   Physical Exam  Vitals reviewed. Constitutional: She appears well-developed and well-nourished. No distress.  HENT:  Head: Normocephalic and atraumatic.  Right Ear: Tympanic membrane normal.  Left Ear: Tympanic membrane normal.  Nose: Mucosal edema and rhinorrhea present. Right sinus exhibits maxillary sinus tenderness. Right sinus exhibits no frontal sinus tenderness. Left sinus exhibits maxillary sinus tenderness. Left sinus exhibits no frontal sinus tenderness.  Mouth/Throat: Uvula is midline and mucous membranes are normal. Posterior oropharyngeal erythema present. No oropharyngeal exudate.  Eyes: Conjunctivae and EOM are normal. Pupils are equal, round, and reactive to light.  Neck: Normal range of motion. Neck supple.  Cardiovascular: Normal rate, regular rhythm and normal heart sounds.   Pulmonary/Chest: Effort normal and breath sounds normal. No respiratory distress. She has no wheezes.  Hacking cough  Lymphadenopathy:    She has no cervical adenopathy.          Assessment & Plan:

## 2013-10-31 NOTE — Assessment & Plan Note (Signed)
Pt's sxs and PE consistent w/ infxn.  Start abx.  Cough meds prn.  Reviewed supportive care and red flags that should prompt return.  Pt expressed understanding and is in agreement w/ plan.  

## 2013-12-06 ENCOUNTER — Other Ambulatory Visit: Payer: Self-pay | Admitting: Internal Medicine

## 2014-02-13 ENCOUNTER — Other Ambulatory Visit: Payer: Self-pay | Admitting: Internal Medicine

## 2014-04-09 ENCOUNTER — Encounter: Payer: Self-pay | Admitting: Internal Medicine

## 2014-04-09 ENCOUNTER — Ambulatory Visit (INDEPENDENT_AMBULATORY_CARE_PROVIDER_SITE_OTHER): Payer: 59 | Admitting: Internal Medicine

## 2014-04-09 ENCOUNTER — Other Ambulatory Visit (INDEPENDENT_AMBULATORY_CARE_PROVIDER_SITE_OTHER): Payer: 59

## 2014-04-09 VITALS — BP 130/82 | HR 102 | Temp 98.2°F | Wt 308.0 lb

## 2014-04-09 DIAGNOSIS — Z0000 Encounter for general adult medical examination without abnormal findings: Secondary | ICD-10-CM

## 2014-04-09 DIAGNOSIS — G8929 Other chronic pain: Secondary | ICD-10-CM

## 2014-04-09 LAB — CBC WITH DIFFERENTIAL/PLATELET
Basophils Absolute: 0 10*3/uL (ref 0.0–0.1)
Basophils Relative: 0.3 % (ref 0.0–3.0)
EOS ABS: 0.2 10*3/uL (ref 0.0–0.7)
EOS PCT: 2.2 % (ref 0.0–5.0)
HCT: 39.1 % (ref 36.0–46.0)
Hemoglobin: 12.7 g/dL (ref 12.0–15.0)
Lymphocytes Relative: 31.1 % (ref 12.0–46.0)
Lymphs Abs: 3.2 10*3/uL (ref 0.7–4.0)
MCHC: 32.5 g/dL (ref 30.0–36.0)
MCV: 81.3 fl (ref 78.0–100.0)
MONO ABS: 0.8 10*3/uL (ref 0.1–1.0)
Monocytes Relative: 7.7 % (ref 3.0–12.0)
NEUTROS PCT: 58.7 % (ref 43.0–77.0)
Neutro Abs: 6 10*3/uL (ref 1.4–7.7)
Platelets: 378 10*3/uL (ref 150.0–400.0)
RBC: 4.81 Mil/uL (ref 3.87–5.11)
RDW: 15.2 % (ref 11.5–15.5)
WBC: 10.2 10*3/uL (ref 4.0–10.5)

## 2014-04-09 LAB — BASIC METABOLIC PANEL
BUN: 12 mg/dL (ref 6–23)
CALCIUM: 9.3 mg/dL (ref 8.4–10.5)
CO2: 27 mEq/L (ref 19–32)
CREATININE: 0.7 mg/dL (ref 0.4–1.2)
Chloride: 108 mEq/L (ref 96–112)
GFR: 101.98 mL/min (ref >60.00)
Glucose, Bld: 107 mg/dL — ABNORMAL HIGH (ref 70–99)
POTASSIUM: 4.1 meq/L (ref 3.5–5.1)
Sodium: 141 mEq/L (ref 135–145)

## 2014-04-09 LAB — URINALYSIS, ROUTINE W REFLEX MICROSCOPIC
Bilirubin Urine: NEGATIVE
HGB URINE DIPSTICK: NEGATIVE
LEUKOCYTES UA: NEGATIVE
NITRITE: NEGATIVE
PH: 6 (ref 5.0–8.0)
Specific Gravity, Urine: >=1.03 — AB (ref 1.000–1.030)
Total Protein, Urine: NEGATIVE
Urine Glucose: NEGATIVE
Urobilinogen, UA: 0.2 (ref 0.0–1.0)

## 2014-04-09 LAB — LIPID PANEL
CHOL/HDL RATIO: 3
Cholesterol: 191 mg/dL (ref 0–200)
HDL: 64.7 mg/dL (ref >39.00)
LDL Cholesterol: 68 mg/dL (ref 0–99)
Triglycerides: 291 mg/dL — ABNORMAL HIGH (ref 0.0–149.0)
VLDL: 58.2 mg/dL — AB (ref 0.0–40.0)

## 2014-04-09 LAB — HEPATIC FUNCTION PANEL
ALT: 23 U/L (ref 0–35)
AST: 23 U/L (ref 0–37)
Albumin: 3.8 g/dL (ref 3.5–5.2)
Alkaline Phosphatase: 75 U/L (ref 39–117)
BILIRUBIN TOTAL: 0.3 mg/dL (ref 0.2–1.2)
Bilirubin, Direct: 0 mg/dL (ref 0.0–0.3)
Total Protein: 6.5 g/dL (ref 6.0–8.3)

## 2014-04-09 LAB — VITAMIN B12: Vitamin B-12: 520 pg/mL (ref 211–911)

## 2014-04-09 LAB — TSH: TSH: 0.5 u[IU]/mL (ref 0.35–4.50)

## 2014-04-09 MED ORDER — PREGABALIN 75 MG PO CAPS: 75.0000 mg | ORAL_CAPSULE | Freq: Every evening | ORAL | Status: DC | PRN

## 2014-04-09 MED ORDER — INDOMETHACIN ER 75 MG PO CPCR: ORAL_CAPSULE | ORAL | Status: DC

## 2014-04-09 NOTE — Progress Notes (Signed)
Subjective:    Patient ID: Sterling Bigheryl L Mccarroll, female    DOB: 11/01/1961, 53 y.o.   MRN: [id number]  HPI  Here for wellness and f/u;  Overall doing ok;  Pt denies CP, worsening SOB, DOE, wheezing, orthopnea, PND, worsening LE edema, palpitations, dizziness or syncope.  Pt denies neurological change such as new headache, facial or extremity weakness.  Pt denies polydipsia, polyuria, or low sugar symptoms. Pt states overall good compliance with treatment and medications, good tolerability, and has been trying to follow lower cholesterol diet.  Pt denies worsening depressive symptoms, suicidal ideation or panic. No fever, night sweats, wt loss, loss of appetite, or other constitutional symptoms.  Pt states good ability with ADL's, has low fall risk, home safety reviewed and adequate, no other significant changes in hearing or vision, and only occasionally active with exercise.  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  C/o LE pain, dull achy but burning sometimes, indocin helps during day but at least mod at night, hard to sleep, and without LBP, radicular pain, knee or other joint pain, or other known neuromusc dz, worse after stopped lyrica  No known hx of neuropathy Past Medical History  Diagnosis Date  . ALLERGIC RHINITIS 10/14/2008  . ANXIETY 10/14/2008  . ASTHMA 10/14/2008  . DEGENERATIVE JOINT DISEASE 10/14/2008  . GERD 10/14/2008  . HYPERLIPIDEMIA 10/14/2008  . INTERMITTENT VERTIGO 11/03/2008  . NEPHROLITHIASIS, HX OF 10/14/2008   Past Surgical History  Procedure Laterality Date  . S/p nasal surgery      x's 2  . Appendectomy    . Tonsillectomy    . S/p right knee surgery      x's 4- Cartilage torn  . S/p left knee surgery    . S/p left ulnar nerve surgery      x's 2  . Cesarean section      reports that she has never smoked. She does not have any smokeless tobacco history on file. Her alcohol and drug histories are not on file. family  history includes Cancer in her maternal grandfather; Diabetes in her maternal grandfather. Allergies  Allergen Reactions  . Codeine     itching   Current Outpatient Prescriptions on File Prior to Visit  Medication Sig Dispense Refill  . nitroGLYCERIN (NITRODUR - DOSED IN MG/24 HR) 0.2 mg/hr patch 1/4 patch daily  30 patch  1  . omeprazole (PRILOSEC) 20 MG capsule TAKE 1 CAPSULE (20 MG TOTAL) BY MOUTH DAILY.  30 capsule  10   No current facility-administered medications on file prior to visit.   Review of Systems Constitutional: Negative for increased diaphoresis, other activity, appetite or other siginficant weight change  HENT: Negative for worsening hearing loss, ear pain, facial swelling, mouth sores and neck stiffness.   Eyes: Negative for other worsening pain, redness or visual disturbance.  Respiratory: Negative for shortness of breath and wheezing.   Cardiovascular: Negative for chest pain and palpitations.  Gastrointestinal: Negative for diarrhea, blood in stool, abdominal distention or other pain Genitourinary: Negative for hematuria, flank pain or change in urine volume.  Musculoskeletal: Negative for myalgias or other joint complaints.  Skin: Negative for color change and wound.  Neurological: Negative for syncope and numbness. other than noted Hematological: Negative for adenopathy. or other swelling Psychiatric/Behavioral: Negative for hallucinations, self-injury, decreased concentration or other worsening agitation.      Objective:   Physical Exam BP 130/82  Pulse 102  Temp(Src) 98.2 F (36.8 C) (Oral)  Wt 308 lb (139.708 kg)  SpO2 96% VS noted,  Constitutional: Pt is oriented to person, place, and time. Appears well-developed and well-nourished.  Head: Normocephalic and atraumatic.  Right Ear: External ear normal.  Left Ear: External ear normal.  Nose: Nose normal.  Mouth/Throat: Oropharynx is clear and moist.  Eyes: Conjunctivae and EOM are normal. Pupils  are equal, round, and reactive to light.  Neck: Normal range of motion. Neck supple. No JVD present. No tracheal deviation present.  Cardiovascular: Normal rate, regular rhythm, normal heart sounds and intact distal pulses.   Pulmonary/Chest: Effort normal and breath sounds without rales or wheezing  Abdominal: Soft. Bowel sounds are normal. NT. No HSM  Musculoskeletal: Normal range of motion. Exhibits no edema.  Lymphadenopathy:  Has no cervical adenopathy.  Neurological: Pt is alert and oriented to person, place, and time. Pt has normal reflexes. No cranial nerve deficit. Motor grossly intact, dtr,sens, gait intact Skin: Skin is warm and dry. No rash noted. No LE edema Psychiatric:  Has normal mood and affect. Behavior is normal.     Assessment & Plan:

## 2014-04-09 NOTE — Progress Notes (Signed)
Pre visit review using our clinic review tool, if applicable. No additional management support is needed unless otherwise documented below in the visit note. 

## 2014-04-09 NOTE — Assessment & Plan Note (Signed)

## 2014-04-09 NOTE — Assessment & Plan Note (Addendum)
prob neuritic LE's pain, declines emg/ncs, cont indocin bid, but re-start lyrica 75 qhs, check b12

## 2014-04-09 NOTE — Patient Instructions (Addendum)
Please take all new medication as prescribed - to re-start the Lyrica at bedtime  Please continue all other medications as before, and refills have been done if requested. Please have the pharmacy call with any other refills you may need.  Please continue your efforts at being more active, low cholesterol diet, and weight control.  You are otherwise up to date with prevention measures today.  Please go to the LAB in the Basement (turn left off the elevator) for the tests to be done today  You will be contacted by phone if any changes need to be made immediately.  Otherwise, you will receive a letter about your results with an explanation, but please check with MyChart first.  Please remember to sign up for MyChart if you have not done so, as this will be important to you in the future with finding out test results, communicating by private email, and scheduling acute appointments online when needed.  Please return in 1 year for your yearly visit, or sooner if needed

## 2014-04-19 ENCOUNTER — Other Ambulatory Visit: Payer: Self-pay | Admitting: Internal Medicine

## 2014-05-05 ENCOUNTER — Ambulatory Visit: Payer: 59 | Admitting: Family Medicine

## 2014-05-17 ENCOUNTER — Encounter: Payer: Self-pay | Admitting: Family Medicine

## 2014-05-17 ENCOUNTER — Other Ambulatory Visit (INDEPENDENT_AMBULATORY_CARE_PROVIDER_SITE_OTHER): Payer: 59

## 2014-05-17 ENCOUNTER — Ambulatory Visit (INDEPENDENT_AMBULATORY_CARE_PROVIDER_SITE_OTHER): Payer: 59 | Admitting: Family Medicine

## 2014-05-17 ENCOUNTER — Encounter: Payer: Self-pay | Admitting: Internal Medicine

## 2014-05-17 ENCOUNTER — Ambulatory Visit (INDEPENDENT_AMBULATORY_CARE_PROVIDER_SITE_OTHER): Payer: 59 | Admitting: Internal Medicine

## 2014-05-17 VITALS — BP 120/86 | HR 82 | Wt 307.0 lb

## 2014-05-17 VITALS — BP 120/86 | HR 82 | Temp 98.5°F | Wt 307.2 lb

## 2014-05-17 DIAGNOSIS — M25551 Pain in right hip: Secondary | ICD-10-CM

## 2014-05-17 DIAGNOSIS — M76899 Other specified enthesopathies of unspecified lower limb, excluding foot: Secondary | ICD-10-CM

## 2014-05-17 DIAGNOSIS — H6120 Impacted cerumen, unspecified ear: Secondary | ICD-10-CM

## 2014-05-17 DIAGNOSIS — M25559 Pain in unspecified hip: Secondary | ICD-10-CM

## 2014-05-17 DIAGNOSIS — H6121 Impacted cerumen, right ear: Secondary | ICD-10-CM

## 2014-05-17 DIAGNOSIS — M7061 Trochanteric bursitis, right hip: Secondary | ICD-10-CM | POA: Insufficient documentation

## 2014-05-17 DIAGNOSIS — H9221 Otorrhagia, right ear: Secondary | ICD-10-CM

## 2014-05-17 DIAGNOSIS — H9210 Otorrhea, unspecified ear: Secondary | ICD-10-CM

## 2014-05-17 MED ORDER — TRAMADOL HCL 50 MG PO TABS: 50.0000 mg | ORAL_TABLET | Freq: Every evening | ORAL | Status: DC | PRN

## 2014-05-17 NOTE — Patient Instructions (Addendum)
Good to see you Ice 20 minutes 2 times daily especially after work and before bed.  Exercises 3-4 times a week or after work.  Tramadol at night.  See me again in 3-4 week.s

## 2014-05-17 NOTE — Progress Notes (Signed)
Tawana ScaleZach Maguire D.O. [facility] [address] [place], [place] [address] Phone: [phone] Subjective:      CC: Pain, hip  BJY:[id number]:Subjective [name] [name] is a 53 y.o. female coming in with complaint of hip pain. Patient does have pain more on the lateral hip. This is right-sided. Patient states that she's had this pain for multiple weeks but seems to be getting worse. Patient states it hurts even touch. Worse when she stands up from a seated position for a long amount of time. Patient states that she stands for long amount of time to again be discomfort. Patient states that the pain is also waking her up at night. Patient states it's localized more of a chronic dull aching sensation that is sharp with touch. Denies any radiation of the leg or any numbness or weakness. Patient was the severity of 8/10.     Past medical history, social, surgical and family history all reviewed in electronic medical record.   Review of Systems: No headache, visual changes, nausea, vomiting, diarrhea, constipation, dizziness, abdominal pain, skin rash, fevers, chills, night sweats, weight loss, swollen lymph nodes, body aches, joint swelling, muscle aches, chest pain, shortness of breath, mood changes.   Objective Blood pressure 120/86, pulse 82, weight 307 lb (139.254 kg), SpO2 99.00%.  General: No apparent distress alert and oriented x3 mood and affect normal, dressed appropriately. Overweight HEENT: Pupils equal, extraocular movements intact  Respiratory: Patient's speak in full sentences and does not appear short of breath  Cardiovascular: No lower extremity edema, non tender, no erythema  Skin: Warm dry intact with no signs of infection or rash on extremities or on axial skeleton.  Abdomen: Soft nontender  Neuro: Cranial nerves II through XII are intact, neurovascularly intact in all extremities with 2+ DTRs and 2+ pulses.  Lymph: No lymphadenopathy of posterior or anterior cervical chain or  axillae bilaterally.  Gait normal with good balance and coordination.  MSK:  Non tender with full range of motion and good stability and symmetric strength and tone of shoulders, elbows, wrist, knee and ankles bilaterally.  Hip: Right ROM IR: 25 Deg, ER: 35 Deg, Flexion: 120 Deg, Extension: 100 Deg, Abduction: 45 Deg, Adduction: 35 Deg Strength IR: 4/5, ER: 5/5, Flexion: 5/5, Extension: 5/5, Abduction: 3+/5, Adduction: 5/5 Pelvic alignment unremarkable to inspection and palpation. Standing hip rotation and gait without trendelenburg sign / unsteadiness. Greater trochanter without tenderness to palpation. No tenderness over piriformis and greater trochanter. Positive Faber No SI joint tenderness and normal minimal SI movement. Contralateral hip  MSK US performed of: Right hip This study was ordered, performed, and interpreted by Terrilee FilesZach Onorato D.O.  Hip: Significant trochanteric bursa with hypoechoic changes noted. Acetabular labrum visualized and without tears, displacement, or effusion in joint. Femoral neck appears unremarkable without increased power doppler signal along Cortex.  IMPRESSION:  Greater trochanteric bursitis   Procedure: Real-time Ultrasound Guided Injection of right greater trochanteric bursitis secondary to patient's body habitus Device: GE Logiq E  Ultrasound guided injection is preferred based studies that show increased duration, increased effect, greater accuracy, decreased procedural pain, increased response rate, and decreased cost with ultrasound guided versus blind injection.  Verbal informed consent obtained.  Time-out conducted.  Noted no overlying erythema, induration, or other signs of local infection.  Skin prepped in a sterile fashion.  Local anesthesia: Topical Ethyl chloride.  With sterile technique and under real time ultrasound guidance:  Greater trochanteric area was visualized and patient's bursa was noted. A 22-gauge  3 inch needle was inserted  and 4 cc of 0.5% Marcaine and 1 cc of Kenalog 40 mg/dL was injected. Pictures taken Completed without difficulty  Pain immediately resolved suggesting accurate placement of the medication.  Advised to call if fevers/chills, erythema, induration, drainage, or persistent bleeding.  Images permanently stored and available for review in the ultrasound unit.  Impression: Technically successful ultrasound guided injection.    Impression and Recommendations:     This case required medical decision making of moderate complexity.

## 2014-05-17 NOTE — Patient Instructions (Addendum)
Please do not use Q-tips as we discussed. Should wax build up occur, please put 2-3 drops of mineral oil in the ear at night and cover the canal with a  cotton ball.In the morning fill the canal with hydrogen peroxide & leave  for 10-15 minutes.Following this shower and use the thinnest washrag available to wick out the wax.   Go to [facility] for eustachian tube dysfunction. Drink thin  fluids liberally through the day and chew sugarless gum . Do the Valsalva maneuver several times a day to "pop" ears open. Flonase 1 spray in each nostril twice a day as needed. Use the "crossover" technique as discussed. Use a Neti pot daily- 2x /day  as needed for sinus congestion

## 2014-05-17 NOTE — Assessment & Plan Note (Signed)
Patient had a injection as described above. Patient was given home exercise program assure proper technique. We discussed topical anti-inflammatories it could be beneficial as well as over-the-counter medications. We discussed an icing protocol as well as good shoes. Patient will follow up again in 3-4 weeks for further evaluation and treatment. Patient continues to have pain further imaging may be necessary.  Spent greater than 25 minutes with patient face-to-face and had greater than 50% of counseling including as described above in assessment and plan.

## 2014-05-17 NOTE — Progress Notes (Signed)
   Subjective:    Patient ID: [name], female    DOB: [birth year], 53 y.o.   MRN: [id number]  HPI  She describes congestion in  the right ear without associated pain, hearing loss, or tinnitus for several weeks. Last week her hearing test was deferred at work because of blood in the canal.  Uses a Q-tip only in the external canal to remove cosmetic residue.    Review of Systems She denies epistaxis, hemoptysis, hematuria, melena, or rectal bleeding.She has no unexplained weight loss, dysphagia, or abdominal pain. She has no abnormal bruising , bleeding or difficulty stopping bleeding with injury.   Frontal headache, facial pain , nasal purulence, dental pain, sore throat , otic pain or otic discharge denied. No fever , chills or sweats.      Objective:   Physical Exam General appearance:good health ;well nourished; no acute distress or increased work of breathing is present.  No  lymphadenopathy about the head, neck, or axilla noted.   Eyes: No conjunctival inflammation or lid edema is present. There is no scleral icterus.  Ears:  External ear exam shows no significant lesions or deformities.  Otoscopic examination reveals minor wax collection @ 6 o'clock on R. The tympanic membranes are intact bilaterally without bulging, retraction, inflammation or discharge.NO bleeding noted.Hearing intact to whisper.  Nose:  External nasal examination shows no deformity or inflammation. Nasal mucosa are pink and moist without lesions or exudates. No septal dislocation or deviation.No obstruction to airflow.   Oral exam: Dental hygiene is good; lips and gums are healthy appearing.There is no oropharyngeal erythema or exudate noted.   Neck:  No deformities, thyromegaly, masses, or tenderness noted.   Supple with full range of motion without pain.   Heart:  Normal rate and regular rhythm. S1 and S2 normal without gallop, murmur, click, rub or other extra sounds.   Lungs:Chest clear to  auscultation; no wheezes, rhonchi,rales ,or rubs present.No increased work of breathing.    Extremities:  No cyanosis, edema, or clubbing  noted    Skin: Warm & dry          Assessment & Plan:  #1 otic bleeding , not documented. No hearing loss present. #2 cerumen , minor Gavage & wax removal.

## 2014-05-17 NOTE — Progress Notes (Signed)
   Subjective:    Patient ID: [name], female    DOB: 06-14-1961, 53 y.o.   MRN: [id number]  HPI Patient comes in today with right ear complaints. She has been feeling "plugged" in the right ear for a few weeks. Last Thursday she went in for a hearing test at work and the tester found blood in the canal and deferred testing.   She did have wax removed from Right ear. The Right ear was cleaned out with warm water mixed with Docusate Sodium. There was wax return from both ears and ear drum was visible. Patient tolerated this well.     Review of Systems Denies sore throat, running nose, head congestion, chest congestion, nonproductive cough, fever, chills, sweats, and respiratory symptoms     Objective:   Physical Exam  Ear: TM's pearly gray and good light reflex bilaterally, minimal wax in right ear.       Assessment & Plan:

## 2014-05-17 NOTE — Progress Notes (Signed)
Pre visit review using our clinic review tool, if applicable. No additional management support is needed unless otherwise documented below in the visit note. 

## 2014-05-19 ENCOUNTER — Telehealth: Payer: Self-pay | Admitting: *Deleted

## 2014-05-19 MED ORDER — NORTRIPTYLINE HCL 25 MG PO CAPS: 25.0000 mg | ORAL_CAPSULE | Freq: Every day | ORAL | Status: DC

## 2014-05-19 NOTE — Telephone Encounter (Signed)
Please call pt and tell her.

## 2014-05-19 NOTE — Telephone Encounter (Signed)
Pt states md rx tramadol for her. She has taking med 2 nights in a row she is having a allergic reaction to med she has been itching/rash. Requesting md to rx something else...Raechel Chute/lmb

## 2014-05-19 NOTE — Telephone Encounter (Signed)
I added tramadol to allergy list  OK to forward to Dr Katrinka BlazingSmith who prescribed the tramadol

## 2014-05-20 NOTE — Telephone Encounter (Signed)
Called pt back no answer LMOM with md response. New rx has been sent to her local pharmacy...[name]/lmb

## 2014-06-09 ENCOUNTER — Ambulatory Visit: Payer: 59 | Admitting: Family Medicine

## 2014-06-09 ENCOUNTER — Other Ambulatory Visit: Payer: Self-pay

## 2014-06-09 DIAGNOSIS — Z1231 Encounter for screening mammogram for malignant neoplasm of breast: Secondary | ICD-10-CM

## 2014-06-22 ENCOUNTER — Encounter: Payer: Self-pay | Admitting: Family Medicine

## 2014-06-22 ENCOUNTER — Ambulatory Visit (INDEPENDENT_AMBULATORY_CARE_PROVIDER_SITE_OTHER): Payer: 59 | Admitting: Family Medicine

## 2014-06-22 VITALS — BP 113/82 | HR 94 | Ht 69.0 in | Wt 302.0 lb

## 2014-06-22 DIAGNOSIS — Z5189 Encounter for other specified aftercare: Secondary | ICD-10-CM

## 2014-06-22 DIAGNOSIS — S76011A Strain of muscle, fascia and tendon of right hip, initial encounter: Secondary | ICD-10-CM | POA: Insufficient documentation

## 2014-06-22 DIAGNOSIS — IMO0002 Reserved for concepts with insufficient information to code with codable children: Secondary | ICD-10-CM

## 2014-06-22 DIAGNOSIS — S76011D Strain of muscle, fascia and tendon of right hip, subsequent encounter: Secondary | ICD-10-CM

## 2014-06-22 NOTE — Assessment & Plan Note (Signed)
Patient is doing well but pain is a little different than it was previously. The patient is having more of a gluteal strain. Patient was given home exercise program and some topical anti-inflammatories but was worn with taking the indomethacin. Discussed icing and heat protocol.  Come back in 3-4 weeks.

## 2014-06-22 NOTE — Patient Instructions (Signed)
It is good to see you.  Ice is your best friend.  Continue the exercises 3 times a week. Try the new exercises Pennsaid 2 times daily. If you like it call [facility] and we will ship you a prescription.  Continue the nortriptyline.  Come back in 3-4 weeks.

## 2014-06-22 NOTE — Progress Notes (Signed)
  Tawana ScaleZach Kilts D.O. [facility] [address] [place], [place] [address] Phone: [phone] Subjective:      CC: Pain, hip follow  BJY:NWGNFAOZHYHPI:Subjective [name] [name] is a 53 y.o. female coming in with complaint of hip pain. Patient does have pain more on the lateral hip. This is right-sided. Patient was seen previously and was more diagnosed with greater trochanteric bursitis. Patient did unfortunately have a side effect of the tramadol. Patient was given an icing regimen, home exercises, as well as nortriptyline. Patient states she is approximately 75% better. Patient states that the nortriptyline is helping at night. Still has some mild discomfort if she rolls on that side. States that there is a discomfort with certain movements but it seems to be less and less. And seems to be improving slowly. Patient was working significant hours which seems to be increasing her pain. No new symptoms.     Past medical history, social, surgical and family history all reviewed in electronic medical record.   Review of Systems: No headache, visual changes, nausea, vomiting, diarrhea, constipation, dizziness, abdominal pain, skin rash, fevers, chills, night sweats, weight loss, swollen lymph nodes, body aches, joint swelling, muscle aches, chest pain, shortness of breath, mood changes.   Objective Blood pressure 113/82, pulse 94, height 5\' 9"  (1.753 m), weight 302 lb (136.986 kg).  General: No apparent distress alert and oriented x3 mood and affect normal, dressed appropriately. Overweight HEENT: Pupils equal, extraocular movements intact  Respiratory: Patient's speak in full sentences and does not appear short of breath  Cardiovascular: No lower extremity edema, non tender, no erythema  Skin: Warm dry intact with no signs of infection or rash on extremities or on axial skeleton.  Abdomen: Soft nontender  Neuro: Cranial nerves II through XII are intact, neurovascularly intact in all extremities with  2+ DTRs and 2+ pulses.  Lymph: No lymphadenopathy of posterior or anterior cervical chain or axillae bilaterally.  Gait normal with good balance and coordination.  MSK:  Non tender with full range of motion and good stability and symmetric strength and tone of shoulders, elbows, wrist, knee and ankles bilaterally.  Hip: Right ROM IR: 25 Deg, ER: 35 Deg, Flexion: 100 Deg, Extension: 100 Deg, Abduction: 45 Deg, Adduction: 35 Deg Strength IR: 4/5, ER: 5/5, Flexion: 5/5, Extension: 5/5, Abduction: 4/5, Adduction: 5/5 Pelvic alignment unremarkable to inspection and palpation. Standing hip rotation and gait without trendelenburg sign / unsteadiness. Greater trochanter seems to be less tender than previous exam and more pain over the gluteal tendon region. No tenderness over piriformis and greater trochanter. Positive Pearlean BrownieFaber continues to improve  No SI joint tenderness and normal minimal SI movement. Contralateral hip unremarkable     Impression and Recommendations:     This case required medical decision making of moderate complexity.

## 2014-07-05 ENCOUNTER — Telehealth: Payer: Self-pay

## 2014-07-05 MED ORDER — DICLOFENAC SODIUM 2 % TD SOLN: TRANSDERMAL | Status: DC

## 2014-07-05 NOTE — Telephone Encounter (Signed)
Pt called and would like a rx of pennsaid sent to her pharmacy.

## 2014-07-05 NOTE — Telephone Encounter (Signed)
rx sent into [facility].

## 2014-07-12 ENCOUNTER — Ambulatory Visit
Admission: RE | Admit: 2014-07-12 | Discharge: 2014-07-12 | Disposition: A | Discharge status: Home / Self Care | Payer: 59 | Source: Ambulatory Visit

## 2014-07-12 DIAGNOSIS — Z1231 Encounter for screening mammogram for malignant neoplasm of breast: Secondary | ICD-10-CM

## 2014-07-15 ENCOUNTER — Ambulatory Visit: Payer: 59 | Admitting: Family Medicine

## 2014-07-20 ENCOUNTER — Ambulatory Visit: Payer: 59 | Admitting: Family Medicine

## 2014-10-06 ENCOUNTER — Encounter: Payer: Self-pay | Admitting: Internal Medicine

## 2014-10-06 ENCOUNTER — Other Ambulatory Visit (INDEPENDENT_AMBULATORY_CARE_PROVIDER_SITE_OTHER): Payer: 59

## 2014-10-06 ENCOUNTER — Ambulatory Visit (INDEPENDENT_AMBULATORY_CARE_PROVIDER_SITE_OTHER): Payer: 59 | Admitting: Internal Medicine

## 2014-10-06 VITALS — BP 120/84 | HR 94 | Temp 99.0°F | Ht 69.0 in | Wt 311.5 lb

## 2014-10-06 DIAGNOSIS — Z23 Encounter for immunization: Secondary | ICD-10-CM

## 2014-10-06 DIAGNOSIS — M791 Myalgia, unspecified site: Secondary | ICD-10-CM

## 2014-10-06 DIAGNOSIS — R509 Fever, unspecified: Secondary | ICD-10-CM

## 2014-10-06 DIAGNOSIS — G8929 Other chronic pain: Secondary | ICD-10-CM

## 2014-10-06 LAB — BASIC METABOLIC PANEL
BUN: 10 mg/dL (ref 6–23)
CHLORIDE: 105 meq/L (ref 96–112)
CO2: 25 mEq/L (ref 19–32)
CREATININE: 0.6 mg/dL (ref 0.4–1.2)
Calcium: 9.1 mg/dL (ref 8.4–10.5)
GFR: 118.44 mL/min (ref >60.00)
Glucose, Bld: 91 mg/dL (ref 70–99)
POTASSIUM: 4.1 meq/L (ref 3.5–5.1)
SODIUM: 137 meq/L (ref 135–145)

## 2014-10-06 LAB — CBC WITH DIFFERENTIAL/PLATELET
BASOS ABS: 0.1 10*3/uL (ref 0.0–0.1)
Basophils Relative: 0.9 % (ref 0.0–3.0)
EOS ABS: 0.3 10*3/uL (ref 0.0–0.7)
Eosinophils Relative: 2.9 % (ref 0.0–5.0)
HCT: 42.3 % (ref 36.0–46.0)
Hemoglobin: 13.9 g/dL (ref 12.0–15.0)
LYMPHS PCT: 33.3 % (ref 12.0–46.0)
Lymphs Abs: 3.1 10*3/uL (ref 0.7–4.0)
MCHC: 32.7 g/dL (ref 30.0–36.0)
MCV: 82.4 fl (ref 78.0–100.0)
Monocytes Absolute: 0.6 10*3/uL (ref 0.1–1.0)
Monocytes Relative: 6.9 % (ref 3.0–12.0)
NEUTROS PCT: 56 % (ref 43.0–77.0)
Neutro Abs: 5.2 10*3/uL (ref 1.4–7.7)
PLATELETS: 397 10*3/uL (ref 150.0–400.0)
RBC: 5.13 Mil/uL — ABNORMAL HIGH (ref 3.87–5.11)
RDW: 14.2 % (ref 11.5–15.5)
WBC: 9.3 10*3/uL (ref 4.0–10.5)

## 2014-10-06 LAB — HEPATIC FUNCTION PANEL
ALBUMIN: 3.9 g/dL (ref 3.5–5.2)
ALT: 27 U/L (ref 0–35)
AST: 24 U/L (ref 0–37)
Alkaline Phosphatase: 89 U/L (ref 39–117)
Bilirubin, Direct: 0.1 mg/dL (ref 0.0–0.3)
TOTAL PROTEIN: 6.7 g/dL (ref 6.0–8.3)
Total Bilirubin: 0.5 mg/dL (ref 0.2–1.2)

## 2014-10-06 LAB — URINALYSIS, ROUTINE W REFLEX MICROSCOPIC
Bilirubin Urine: NEGATIVE
Hgb urine dipstick: NEGATIVE
Ketones, ur: NEGATIVE
Leukocytes, UA: NEGATIVE
NITRITE: NEGATIVE
RBC / HPF: NONE SEEN (ref 0–?)
TOTAL PROTEIN, URINE-UPE24: NEGATIVE
UROBILINOGEN UA: 0.2 (ref 0.0–1.0)
Urine Glucose: NEGATIVE
WBC UA: NONE SEEN (ref 0–?)
pH: 5.5 (ref 5.0–8.0)

## 2014-10-06 LAB — CK: Total CK: 50 U/L (ref 7–177)

## 2014-10-06 LAB — SEDIMENTATION RATE: Sed Rate: 18 mm/hr (ref 0–22)

## 2014-10-06 MED ORDER — HYDROCODONE-ACETAMINOPHEN 5-325 MG PO TABS: 1.0000 | ORAL_TABLET | Freq: Four times a day (QID) | ORAL | Status: DC | PRN

## 2014-10-06 NOTE — Progress Notes (Signed)
Pre visit review using our clinic review tool, if applicable. No additional management support is needed unless otherwise documented below in the visit note. 

## 2014-10-06 NOTE — Assessment & Plan Note (Addendum)
I suspect related to febrile illness, allergic to codeine/tramadol, and nsaid no help, ok for limited vicodin prn,  to f/u any worsening symptoms or concerns, check CK total today and esr 

## 2014-10-06 NOTE — Patient Instructions (Addendum)
Please take all new medication as prescribed  The vicodin for pain  You had the routine update of your tetanus today  Please continue all other medications as before, and refills have been done if requested.  Please have the pharmacy call with any other refills you may need.  Please keep your appointments with your specialists as you may have planned  Please go to the LAB in the Basement (turn left off the elevator) for the tests to be done today  You will be contacted by phone if any changes need to be made immediately.  Otherwise, you will receive a letter about your results with an explanation, but please check with MyChart first.  Please remember to sign up for MyChart if you have not done so, as this will be important to you in the future with finding out test results, communicating by private email, and scheduling acute appointments online when needed.  I would not advise hunting tomorrow in your condition, and you should limit your contact exposure to other persons in case this is a viral illness

## 2014-10-06 NOTE — Progress Notes (Signed)
Subjective:    Patient ID: [name], female    DOB: Oct 21, 1961, 53 y.o.   MRN: [id number]  HPI  Here with several days mod to severe "pain all over" achy and sharp, after recent mistep at halloween, happened to step in a hole that jarred her, and small cut to the leg on rusty object now improved, has tried celebrex and indocin that did not seem to help except for higher dose indocin.  Pain mostly to the neck, elbows and "all over' Unaware of any fever today, Does c/o new fatigue, but denies signficant daytime hypersomnolence.  Planning on family gathering including hunting activity tomorrow am.  Denies urinary symptoms such as dysuria, frequency, urgency, flank pain, hematuria or n/v, fever, chills.  Past Medical History  Diagnosis Date  . ALLERGIC RHINITIS 10/14/2008  . ANXIETY 10/14/2008  . ASTHMA 10/14/2008  . DEGENERATIVE JOINT DISEASE 10/14/2008  . GERD 10/14/2008  . HYPERLIPIDEMIA 10/14/2008  . INTERMITTENT VERTIGO 11/03/2008  . NEPHROLITHIASIS, HX OF 10/14/2008   Past Surgical History  Procedure Laterality Date  . S/p nasal surgery      x's 2  . Appendectomy    . Tonsillectomy    . S/p right knee surgery      x's 4- Cartilage torn  . S/p left knee surgery    . S/p left ulnar nerve surgery      x's 2  . Cesarean section      reports that she has never smoked. She does not have any smokeless tobacco history on file. Her alcohol and drug histories are not on file. family history includes Cancer in her maternal grandfather; Diabetes in her maternal grandfather. Allergies  Allergen Reactions  . Codeine     itching  . Tramadol Rash   Current Outpatient Prescriptions on File Prior to Visit  Medication Sig Dispense Refill  . Diclofenac Sodium 2 % SOLN Apply twice daily. 112 g 1  . indomethacin (INDOCIN SR) 75 MG CR capsule TAKE 1 CAPSULE (75 MG TOTAL) BY MOUTH 2 (TWO) TIMES DAILY WITH A MEAL. AS NEEDED 60 capsule 1  . nortriptyline (PAMELOR) 25 MG capsule Take 1 capsule (25  mg total) by mouth at bedtime. 30 capsule 1  . omeprazole (PRILOSEC) 20 MG capsule TAKE 1 CAPSULE (20 MG TOTAL) BY MOUTH DAILY. 30 capsule 10  . pregabalin (LYRICA) 75 MG capsule Take 1 capsule (75 mg total) by mouth at bedtime as needed. 90 capsule 1   No current facility-administered medications on file prior to visit.   Review of Systems  Constitutional: Negative for unusual diaphoresis or other sweats  HENT: Negative for ringing in ear Eyes: Negative for double vision or worsening visual disturbance.  Respiratory: Negative for choking and stridor.   Gastrointestinal: Negative for vomiting or other signifcant bowel change Genitourinary: Negative for hematuria or decreased urine volume.  Musculoskeletal: Negative for other MSK pain or swelling Skin: Negative for color change and worsening wound.  Neurological: Negative for tremors and numbness other than noted  Psychiatric/Behavioral: Negative for decreased concentration or agitation other than above       Objective:   Physical Exam BP 120/84 mmHg  Pulse 94  Temp(Src) 99 F (37.2 C) (Oral)  Ht 5\' 9"  (1.753 m)  Wt 311 lb 8 oz (141.295 kg)  BMI 45.98 kg/m2  SpO2 98% VS noted, feels warm but nontoxic appearing Constitutional: Pt appears well-developed, well-nourished. Lavella Lemons/obese HENT: Head: NCAT.  Right Ear: External ear normal.  Left Ear: External  ear normal.  Eyes: . Pupils are equal, round, and reactive to light. Conjunctivae and EOM are normal Neck: Normal range of motion. Neck supple.  Cardiovascular: Normal rate and regular rhythm.   Pulmonary/Chest: Effort normal and breath sounds normal.  Abd:  Soft,  ND, + BS with mild epigastric tender only, no guarding or rebound Neurological: Pt is alert. Not confused , motor grossly intact Skin: Skin is warm. No rash, small 3 cm superfical scratch/laceration lengthwise and just above ankle healed without red/tedner, swelling or red streaks Psychiatric: Pt behavior is normal. No  agitation.     Assessment & Plan:

## 2014-10-06 NOTE — Assessment & Plan Note (Signed)
O/w stable overall by history and exam,  and pt to continue medical treatment as before,  to f/u any worsening symptoms or concerns, for lyrica refill

## 2014-10-06 NOTE — Assessment & Plan Note (Signed)
Etiology unclear, exam benign, for ua, cbc , lft;s, with further tx such as antibiotic pending evaluation.  Pt still have GB but no report of pain with hx today, only tender epigstric on exam

## 2014-10-12 ENCOUNTER — Other Ambulatory Visit: Payer: Self-pay | Admitting: Internal Medicine

## 2014-10-12 NOTE — Telephone Encounter (Signed)
Faxed hardcopy for Lyrica to [facility] [address] [place]

## 2014-10-12 NOTE — Telephone Encounter (Signed)
Done hardcopy to robin  

## 2014-11-03 ENCOUNTER — Encounter: Payer: Self-pay | Admitting: Internal Medicine

## 2014-11-03 ENCOUNTER — Ambulatory Visit (INDEPENDENT_AMBULATORY_CARE_PROVIDER_SITE_OTHER): Payer: 59 | Admitting: Internal Medicine

## 2014-11-03 VITALS — BP 118/72 | HR 118 | Temp 99.4°F | Ht 69.0 in | Wt 311.2 lb

## 2014-11-03 DIAGNOSIS — J209 Acute bronchitis, unspecified: Secondary | ICD-10-CM | POA: Insufficient documentation

## 2014-11-03 DIAGNOSIS — F411 Generalized anxiety disorder: Secondary | ICD-10-CM

## 2014-11-03 DIAGNOSIS — R062 Wheezing: Secondary | ICD-10-CM

## 2014-11-03 MED ORDER — LEVOFLOXACIN 500 MG PO TABS: 500.0000 mg | ORAL_TABLET | Freq: Every day | ORAL | Status: DC

## 2014-11-03 MED ORDER — PREDNISONE 10 MG PO TABS: ORAL_TABLET | ORAL | Status: DC

## 2014-11-03 MED ORDER — HYDROCODONE-HOMATROPINE 5-1.5 MG/5ML PO SYRP: 5.0000 mL | ORAL_SOLUTION | Freq: Four times a day (QID) | ORAL | Status: DC | PRN

## 2014-11-03 MED ORDER — METHYLPREDNISOLONE ACETATE 80 MG/ML IJ SUSP
80.0000 mg | Freq: Once | INTRAMUSCULAR | Status: AC
  Administered 2014-11-03: 80 mg via INTRAMUSCULAR

## 2014-11-03 NOTE — Progress Notes (Signed)
Subjective:    Patient ID: [name], female    DOB: 11/02/1961, 53 y.o.   MRN: [id number]  HPI   Here with 2-3 days acute onset fever, facial pain, pressure, headache, general weakness and malaise, and greenish d/c, with mild ST and cough, but pt denies chest pain, wheezing, increased sob or doe, orthopnea, PND, increased LE swelling, palpitations, dizziness or syncope, except for onset wheezing/sob since last PM. Denies worsening depressive symptoms, suicidal ideation, or pani Past Medical History  Diagnosis Date  . ALLERGIC RHINITIS 10/14/2008  . ANXIETY 10/14/2008  . ASTHMA 10/14/2008  . DEGENERATIVE JOINT DISEASE 10/14/2008  . GERD 10/14/2008  . HYPERLIPIDEMIA 10/14/2008  . INTERMITTENT VERTIGO 11/03/2008  . NEPHROLITHIASIS, HX OF 10/14/2008   Past Surgical History  Procedure Laterality Date  . S/p nasal surgery      x's 2  . Appendectomy    . Tonsillectomy    . S/p right knee surgery      x's 4- Cartilage torn  . S/p left knee surgery    . S/p left ulnar nerve surgery      x's 2  . Cesarean section      reports that she has never smoked. She does not have any smokeless tobacco history on file. Her alcohol and drug histories are not on file. family history includes Cancer in her maternal grandfather; Diabetes in her maternal grandfather. Allergies  Allergen Reactions  . Codeine     itching  . Tramadol Rash   Current Outpatient Prescriptions on File Prior to Visit  Medication Sig Dispense Refill  . HYDROcodone-acetaminophen (NORCO/VICODIN) 5-325 MG per tablet Take 1 tablet by mouth every 6 (six) hours as needed for moderate pain. 30 tablet 0  . indomethacin (INDOCIN SR) 75 MG CR capsule TAKE 1 CAPSULE (75 MG TOTAL) BY MOUTH 2 (TWO) TIMES DAILY WITH A MEAL. AS NEEDED 60 capsule 1  . LYRICA 75 MG capsule TAKE 1 CAPSULE BY MOUTH AT BEDTIME AS NEEDED. 90 capsule 1  . omeprazole (PRILOSEC) 20 MG capsule TAKE 1 CAPSULE (20 MG TOTAL) BY MOUTH DAILY. 30 capsule 10   No current  facility-administered medications on file prior to visit.   Review of Systems VS noted,  Constitutional: Pt appears well-developed, well-nourished.  HENT: Head: NCAT.  Right Ear: External ear normal.  Left Ear: External ear normal.  Eyes: . Pupils are equal, round, and reactive to light. Conjunctivae and EOM are normal Neck: Normal range of motion. Neck supple.  Cardiovascular: Normal rate and regular rhythm.   Pulmonary/Chest: Effort normal and breath sounds without rales or wheezing.  Abd:  Soft, NT, ND, + BS Neurological: Pt is alert. Not confused , motor grossly intact Skin: Skin is warm. No rash Psychiatric: Pt behavior is normal. No agitation.      Objective:   Physical Exam BP 118/72 mmHg  Pulse 118  Temp(Src) 99.4 F (37.4 C) (Oral)  Ht 5\' 9"  (1.753 m)  Wt 311 lb 4 oz (141.182 kg)  BMI 45.94 kg/m2  SpO2 94% VS noted, mild ill appearing Constitutional: Pt appears well-developed, well-nourished.  HENT: Head: NCAT.  Right Ear: External ear normal.  Left Ear: External ear normal.  Eyes: . Pupils are equal, round, and reactive to light. Conjunctivae and EOM are normal Neck: Normal range of motion. Neck supple.  Cardiovascular: Normal rate and regular rhythm.   Pulmonary/Chest: Effort normal and breath sounds without rales but + mild wheezing.  bilat Neurological: Pt is alert. Not confused ,  motor grossly intact Skin: Skin is warm. No rash Psychiatric: Pt behavior is normal. No agitation. mild nervous    Assessment & Plan:

## 2014-11-03 NOTE — Patient Instructions (Signed)
Please take all new medication as prescribed - the antibiotic, cough medicine, and prednisone  You had the steroid shot today  Please continue all other medications as before, and refills have been done if requested.  Please have the pharmacy call with any other refills you may need.  Please keep your appointments with your specialists as you may have planned

## 2014-11-03 NOTE — Progress Notes (Signed)
Pre visit review using our clinic review tool, if applicable. No additional management support is needed unless otherwise documented below in the visit note. 

## 2014-11-04 NOTE — Assessment & Plan Note (Signed)
stable overall by history and exam, and pt to continue medical treatment as before,  to f/u any worsening symptoms or concerns 

## 2014-11-04 NOTE — Assessment & Plan Note (Signed)
Mild to mod, for depomedroll IM, predpac asd,  to f/u any worsening symptoms or concerns

## 2014-11-04 NOTE — Assessment & Plan Note (Signed)
Mild to mod, for antibx course,  to f/u any worsening symptoms or concerns 

## 2014-11-08 ENCOUNTER — Telehealth: Payer: Self-pay | Admitting: Internal Medicine

## 2014-11-08 NOTE — Telephone Encounter (Signed)
Pt called in said that the meds that she got last week is not touching her.  She barley has a voice and is not feeling any better.  She wanted to know if Dr Jonny Ruizjohn could call in anything else that was stronger and would help?

## 2014-11-09 NOTE — Telephone Encounter (Signed)
Patient informed. 

## 2014-11-09 NOTE — Telephone Encounter (Signed)
There really isnt anything stronger, maybe infection was viral to start with, and will likely simply take longer to resolve; ok to follow if no fever

## 2014-11-20 NOTE — Addendum Note (Signed)
Addended by: Corwin LevinsJOHN, Phoenix Riesen W on: 11/20/2014 06:42 PM   Modules accepted: Kipp BroodSmartSet

## 2015-01-09 ENCOUNTER — Other Ambulatory Visit: Payer: Self-pay | Admitting: Internal Medicine

## 2015-04-15 ENCOUNTER — Other Ambulatory Visit: Payer: Self-pay | Admitting: Internal Medicine

## 2015-04-15 NOTE — Telephone Encounter (Signed)
OK X1 

## 2015-04-15 NOTE — Telephone Encounter (Signed)
[facility].../lmb 

## 2015-04-15 NOTE — Telephone Encounter (Signed)
md out of office pls advise on refill...lmb 

## 2015-04-28 ENCOUNTER — Other Ambulatory Visit: Payer: Self-pay | Admitting: Orthopaedic Surgery

## 2015-04-28 DIAGNOSIS — M542 Cervicalgia: Secondary | ICD-10-CM

## 2015-05-17 ENCOUNTER — Ambulatory Visit
Admission: RE | Admit: 2015-05-17 | Discharge: 2015-05-17 | Disposition: A | Discharge status: Home / Self Care | Payer: Commercial Managed Care - HMO | Source: Ambulatory Visit | Attending: Orthopaedic Surgery | Admitting: Orthopaedic Surgery

## 2015-05-17 DIAGNOSIS — M542 Cervicalgia: Secondary | ICD-10-CM

## 2015-06-14 ENCOUNTER — Encounter: Payer: Self-pay | Admitting: Internal Medicine

## 2015-06-14 ENCOUNTER — Ambulatory Visit (INDEPENDENT_AMBULATORY_CARE_PROVIDER_SITE_OTHER): Payer: Commercial Managed Care - HMO | Admitting: Internal Medicine

## 2015-06-14 VITALS — BP 118/74 | HR 100 | Temp 97.8°F | Ht 69.0 in | Wt 313.0 lb

## 2015-06-14 DIAGNOSIS — Z0000 Encounter for general adult medical examination without abnormal findings: Secondary | ICD-10-CM

## 2015-06-14 DIAGNOSIS — M1990 Unspecified osteoarthritis, unspecified site: Secondary | ICD-10-CM

## 2015-06-14 MED ORDER — PANTOPRAZOLE SODIUM 40 MG PO TBEC: 40.0000 mg | DELAYED_RELEASE_TABLET | Freq: Every day | ORAL | Status: DC

## 2015-06-14 MED ORDER — INDOMETHACIN ER 75 MG PO CPCR: ORAL_CAPSULE | ORAL | Status: DC

## 2015-06-14 NOTE — Progress Notes (Signed)
Pre visit review using our clinic review tool, if applicable. No additional management support is needed unless otherwise documented below in the visit note. 

## 2015-06-14 NOTE — Assessment & Plan Note (Signed)
Left knee primarily  - for nsaid refill,  to f/u any worsening symptoms or concerns

## 2015-06-14 NOTE — Patient Instructions (Signed)
OK to stop the prilosec  Please take all new medication as prescribed  - the protonix  Please continue all other medications as before, and refills have been done if requested.  Please have the pharmacy call with any other refills you may need.  Please continue your efforts at being more active, low cholesterol diet, and weight control.  You are otherwise up to date with prevention measures today.  Please keep your appointments with your specialists as you may have planned  Please go to the LAB in the Basement (turn left off the elevator) for the tests to be done tomorrow or at your convenience  You will be contacted by phone if any changes need to be made immediately.  Otherwise, you will receive a letter about your results with an explanation, but please check with MyChart first.  Please remember to sign up for MyChart if you have not done so, as this will be important to you in the future with finding out test results, communicating by private email, and scheduling acute appointments online when needed.  Please return in 1 year for your yearly visit, or sooner if needed, with Lab testing done 3-5 days before

## 2015-06-14 NOTE — Progress Notes (Signed)
Subjective:    Patient ID: [name], female    DOB: [birth year], 54 y.o.   MRN: [id number]  HPI  Here for wellness and f/u;  Overall doing ok;  Pt denies Chest pain, worsening SOB, DOE, wheezing, orthopnea, PND, worsening LE edema, palpitations, dizziness or syncope.  Pt denies neurological change such as new headache, facial or extremity weakness.  Pt denies polydipsia, polyuria, or low sugar symptoms. Pt states overall good compliance with treatment and medications, good tolerability, and has been trying to follow appropriate diet.  Pt denies worsening depressive symptoms, suicidal ideation or panic. No fever, night sweats, wt loss, loss of appetite, or other constitutional symptoms.  Pt states good ability with ADL's, has low fall risk, home safety reviewed and adequate, no other significant changes in hearing or vision, and only occasionally active with exercise. No current complaints Past Medical History  Diagnosis Date  . ALLERGIC RHINITIS 10/14/2008  . ANXIETY 10/14/2008  . ASTHMA 10/14/2008  . DEGENERATIVE JOINT DISEASE 10/14/2008  . GERD 10/14/2008  . HYPERLIPIDEMIA 10/14/2008  . INTERMITTENT VERTIGO 11/03/2008  . NEPHROLITHIASIS, HX OF 10/14/2008   Past Surgical History  Procedure Laterality Date  . S/p nasal surgery      x's 2  . Appendectomy    . Tonsillectomy    . S/p right knee surgery      x's 4- Cartilage torn  . S/p left knee surgery    . S/p left ulnar nerve surgery      x's 2  . Cesarean section      reports that she has never smoked. She does not have any smokeless tobacco history on file. Her alcohol and drug histories are not on file. family history includes Cancer in her maternal grandfather; Diabetes in her maternal grandfather. Allergies  Allergen Reactions  . Codeine     itching  . Tramadol Rash   No current outpatient prescriptions on file prior to visit.   No current facility-administered medications on file prior to visit.   Review of  Systems Constitutional: Negative for increased diaphoresis, other activity, appetite or siginficant weight change other than noted HENT: Negative for worsening hearing loss, ear pain, facial swelling, mouth sores and neck stiffness.   Eyes: Negative for other worsening pain, redness or visual disturbance.  Respiratory: Negative for shortness of breath and wheezing  Cardiovascular: Negative for chest pain and palpitations.  Gastrointestinal: Negative for diarrhea, blood in stool, abdominal distention or other pain Genitourinary: Negative for hematuria, flank pain or change in urine volume.  Musculoskeletal: Negative for myalgias or other joint complaints.  Skin: Negative for color change and wound or drainage.  Neurological: Negative for syncope and numbness. other than noted Hematological: Negative for adenopathy. or other swelling Psychiatric/Behavioral: Negative for hallucinations, SI, self-injury, decreased concentration or other worsening agitation.      Objective:   Physical Exam BP 118/74 mmHg  Pulse 100  Temp(Src) 97.8 F (36.6 C) (Oral)  Ht 5\' 9"  (1.753 m)  Wt 313 lb (141.976 kg)  BMI 46.20 kg/m2  SpO2 96%  LMP 06/07/2015 VS noted,  Constitutional: Pt is oriented to person, place, and time. Appears well-developed and well-nourished, in no significant distress Head: Normocephalic and atraumatic.  Right Ear: External ear normal.  Left Ear: External ear normal.  Nose: Nose normal.  Mouth/Throat: Oropharynx is clear and moist.  Eyes: Conjunctivae and EOM are normal. Pupils are equal, round, and reactive to light.  Neck: Normal range of motion. Neck supple. No JVD  present. No tracheal deviation present or significant neck LA or mass Cardiovascular: Normal rate, regular rhythm, normal heart sounds and intact distal pulses.   Pulmonary/Chest: Effort normal and breath sounds without rales or wheezing  Abdominal: Soft. Bowel sounds are normal. NT. No HSM  Musculoskeletal: Normal  range of motion. Exhibits no edema.  Lymphadenopathy:  Has no cervical adenopathy.  Neurological: Pt is alert and oriented to person, place, and time. Pt has normal reflexes. No cranial nerve deficit. Motor grossly intact Skin: Skin is warm and dry. No rash noted.  Psychiatric:  Has normal mood and affect. Behavior is normal.      Assessment & Plan:

## 2015-06-14 NOTE — Assessment & Plan Note (Signed)

## 2015-06-17 ENCOUNTER — Other Ambulatory Visit (INDEPENDENT_AMBULATORY_CARE_PROVIDER_SITE_OTHER): Payer: Commercial Managed Care - HMO

## 2015-06-17 DIAGNOSIS — Z0000 Encounter for general adult medical examination without abnormal findings: Secondary | ICD-10-CM

## 2015-06-17 LAB — URINALYSIS, ROUTINE W REFLEX MICROSCOPIC
KETONES UR: NEGATIVE
Leukocytes, UA: NEGATIVE
NITRITE: NEGATIVE
Specific Gravity, Urine: >=1.03 — AB (ref 1.000–1.030)
TOTAL PROTEIN, URINE-UPE24: NEGATIVE
URINE GLUCOSE: NEGATIVE
Urobilinogen, UA: 0.2 (ref 0.0–1.0)
pH: 5.5 (ref 5.0–8.0)

## 2015-06-17 LAB — BASIC METABOLIC PANEL
BUN: 16 mg/dL (ref 6–23)
CO2: 25 mEq/L (ref 19–32)
Calcium: 9.2 mg/dL (ref 8.4–10.5)
Chloride: 106 mEq/L (ref 96–112)
Creatinine, Ser: 0.68 mg/dL (ref 0.40–1.20)
GFR: 96.36 mL/min (ref >60.00)
Glucose, Bld: 94 mg/dL (ref 70–99)
Potassium: 4.1 mEq/L (ref 3.5–5.1)
Sodium: 138 mEq/L (ref 135–145)

## 2015-06-17 LAB — HEPATIC FUNCTION PANEL
ALT: 18 U/L (ref 0–35)
AST: 15 U/L (ref 0–37)
Albumin: 3.7 g/dL (ref 3.5–5.2)
Alkaline Phosphatase: 85 U/L (ref 39–117)
BILIRUBIN DIRECT: 0.1 mg/dL (ref 0.0–0.3)
BILIRUBIN TOTAL: 0.3 mg/dL (ref 0.2–1.2)
Total Protein: 6.4 g/dL (ref 6.0–8.3)

## 2015-06-17 LAB — CBC WITH DIFFERENTIAL/PLATELET
Basophils Absolute: 0.1 10*3/uL (ref 0.0–0.1)
Basophils Relative: 0.5 % (ref 0.0–3.0)
Eosinophils Absolute: 0.4 10*3/uL (ref 0.0–0.7)
Eosinophils Relative: 3.8 % (ref 0.0–5.0)
HEMATOCRIT: 42.2 % (ref 36.0–46.0)
HEMOGLOBIN: 14.1 g/dL (ref 12.0–15.0)
LYMPHS PCT: 33.7 % (ref 12.0–46.0)
Lymphs Abs: 3.2 10*3/uL (ref 0.7–4.0)
MCHC: 33.4 g/dL (ref 30.0–36.0)
MCV: 84.4 fl (ref 78.0–100.0)
MONO ABS: 0.9 10*3/uL (ref 0.1–1.0)
Monocytes Relative: 9.3 % (ref 3.0–12.0)
NEUTROS PCT: 52.7 % (ref 43.0–77.0)
Neutro Abs: 5 10*3/uL (ref 1.4–7.7)
PLATELETS: 403 10*3/uL — AB (ref 150.0–400.0)
RBC: 5.01 Mil/uL (ref 3.87–5.11)
RDW: 14.1 % (ref 11.5–15.5)
WBC: 9.5 10*3/uL (ref 4.0–10.5)

## 2015-06-17 LAB — LIPID PANEL
Cholesterol: 179 mg/dL (ref 0–200)
HDL: 61.1 mg/dL (ref >39.00)
LDL Cholesterol: 83 mg/dL (ref 0–99)
NonHDL: 118.17
Total CHOL/HDL Ratio: 3
Triglycerides: 177 mg/dL — ABNORMAL HIGH (ref 0.0–149.0)
VLDL: 35.4 mg/dL (ref 0.0–40.0)

## 2015-06-17 LAB — TSH: TSH: 0.87 u[IU]/mL (ref 0.35–4.50)

## 2015-06-18 ENCOUNTER — Encounter: Payer: Self-pay | Admitting: Internal Medicine

## 2015-06-20 ENCOUNTER — Other Ambulatory Visit: Payer: Self-pay

## 2015-06-20 DIAGNOSIS — Z1231 Encounter for screening mammogram for malignant neoplasm of breast: Secondary | ICD-10-CM

## 2015-07-15 ENCOUNTER — Ambulatory Visit
Admission: RE | Admit: 2015-07-15 | Discharge: 2015-07-15 | Disposition: A | Discharge status: Home / Self Care | Payer: Commercial Managed Care - HMO | Source: Ambulatory Visit

## 2015-07-15 DIAGNOSIS — Z1231 Encounter for screening mammogram for malignant neoplasm of breast: Secondary | ICD-10-CM

## 2015-07-22 ENCOUNTER — Telehealth: Payer: Self-pay

## 2015-07-22 NOTE — Telephone Encounter (Signed)
Pt called requesting labs to Dx Arthritis, however pt was Dx with Osteoarthritis at 06/14/2015. Please advise

## 2015-07-22 NOTE — Telephone Encounter (Signed)
There is no lab testing needed for OA diagnosis which is what she had on exam at last visit;  There was no suggestion of need for testing for RA or Lupus or other at that time. thanks

## 2015-07-27 NOTE — Telephone Encounter (Signed)
Pt advised and was unsatisfied with MD response.Pt stated that she will make an appt with Rheum on her own

## 2015-08-10 ENCOUNTER — Encounter: Payer: Self-pay | Admitting: Family Medicine

## 2015-08-10 ENCOUNTER — Ambulatory Visit (INDEPENDENT_AMBULATORY_CARE_PROVIDER_SITE_OTHER): Payer: Commercial Managed Care - HMO | Admitting: Family Medicine

## 2015-08-10 ENCOUNTER — Other Ambulatory Visit (INDEPENDENT_AMBULATORY_CARE_PROVIDER_SITE_OTHER): Payer: Commercial Managed Care - HMO

## 2015-08-10 VITALS — BP 132/82 | HR 88 | Wt 313.0 lb

## 2015-08-10 DIAGNOSIS — M7061 Trochanteric bursitis, right hip: Secondary | ICD-10-CM | POA: Diagnosis not present

## 2015-08-10 DIAGNOSIS — S76011D Strain of muscle, fascia and tendon of right hip, subsequent encounter: Secondary | ICD-10-CM

## 2015-08-10 DIAGNOSIS — M25551 Pain in right hip: Secondary | ICD-10-CM

## 2015-08-10 DIAGNOSIS — S76311D Strain of muscle, fascia and tendon of the posterior muscle group at thigh level, right thigh, subsequent encounter: Secondary | ICD-10-CM | POA: Diagnosis not present

## 2015-08-10 NOTE — Assessment & Plan Note (Signed)
Patient given another injection and tolerated the procedure well. I do think that this will be beneficial. Patient given exercises specifically more for the gluteal tendon and its tendinitis. Patient given a trial of topical anti-inflammatory is an icing protocol. Work with Event organiser today. Will come back and see me again in 4 weeks.

## 2015-08-10 NOTE — Patient Instructions (Addendum)
Good to see you  Ice 20 minutes 2 times daily. Usually after activity and before bed. Exercises on wall.  Heel and butt touching.  Raise leg 6 inches and hold 2 seconds.  Down slow for count of 4 seconds.  1 set of 30 reps daily on both sides.  Exercises 3 times a week in the handout pennsaid pinkie amount topically 2 times daily as needed.  Wear good supportive shoes Enjoy the retirement life! See me again in 4 weeks.

## 2015-08-10 NOTE — Progress Notes (Signed)
[facility] [address] [place], [place] [address] Phone: [phone] Subjective:      CC: Right hip pain  [id number] [name] is a 54 y.o. female coming in with complaint of hip pain. Patient was seen greater than one year ago. Patient was diagnosed with greater trochanter bursitis and did respond very well to the injection. Differential did include a lumbar radiculopathy as well. Patient was doing very well but states that the pain seems to be coming back. Patient does have pain more on the lateral hip.  Worse when she stands up from a seated position for a long amount of time. Patient states that she stands for long amount of time to again be discomfort. Patient states that the pain is also waking her up at night. Patient states it's localized more of a chronic dull aching sensation that is sharp with touch. Denies any radiation of the leg or any numbness or weakness. Patient was the severity of 8/10.     Past medical history, social, surgical and family history all reviewed in electronic medical record.   Review of Systems: No headache, visual changes, nausea, vomiting, diarrhea, constipation, dizziness, abdominal pain, skin rash, fevers, chills, night sweats, weight loss, swollen lymph nodes, body aches, joint swelling, muscle aches, chest pain, shortness of breath, mood changes.   Objective Blood pressure 132/82, pulse 88, weight 313 lb (141.976 kg), last menstrual period 06/10/2015.  General: No apparent distress alert and oriented x3 mood and affect normal, dressed appropriately. Overweight HEENT: Pupils equal, extraocular movements intact  Respiratory: Patient's speak in full sentences and does not appear short of breath  Cardiovascular: No lower extremity edema, non tender, no erythema  Skin: Warm dry intact with no signs of infection or rash on extremities or on axial skeleton.  Abdomen: Soft nontender  Neuro: Cranial nerves II through  XII are intact, neurovascularly intact in all extremities with 2+ DTRs and 2+ pulses.  Lymph: No lymphadenopathy of posterior or anterior cervical chain or axillae bilaterally.  Gait normal with good balance and coordination.  MSK:  Non tender with full range of motion and good stability and symmetric strength and tone of shoulders, elbows, wrist, knee and ankles bilaterally.  Hip: Right ROM IR: 25 Deg, ER: 35 Deg, Flexion: 120 Deg, Extension: 100 Deg, Abduction: 45 Deg, Adduction: 35 Deg Strength IR: 4/5, ER: 5/5, Flexion: 5/5, Extension: 5/5, Abduction: 3+/5, Adduction: 5/5 Pelvic alignment unremarkable to inspection and palpation. Standing hip rotation and gait without trendelenburg sign / unsteadiness. Greater trochanter without tenderness to palpation. Tender over piriformis as well as distally over the gluteal tendon near its insertion. Positive Faber No SI joint tenderness and normal minimal SI movement. Contralateral hip  MSK US performed of: Right hip This study was ordered, performed, and interpreted by Terrilee Files D.O.  Hip: Significant trochanteric bursa with hypoechoic changes noted. Acetabular labrum visualized and without tears, displacement, or effusion in joint. Femoral neck appears unremarkable without increased power doppler signal along Cortex. Patient does have some inflammation of the gluteal tendon as well.  IMPRESSION:  Greater trochanteric bursitis with gluteal tendon inflammation   Procedure: Real-time Ultrasound Guided Injection of right greater trochanteric bursitis secondary to patient's body habitus Device: GE Logiq E  Ultrasound guided injection is preferred based studies that show increased duration, increased effect, greater accuracy, decreased procedural pain, increased response rate, and decreased cost with ultrasound guided versus blind injection.  Verbal informed consent obtained.  Time-out conducted.  Noted no overlying erythema, induration, or  other signs of local infection.  Skin prepped in a sterile fashion.  Local anesthesia: Topical Ethyl chloride.  With sterile technique and under real time ultrasound guidance:  Greater trochanteric area was visualized and patient's bursa was noted. A 22-gauge 3 inch needle was inserted and 4 cc of 0.5% Marcaine and 1 cc of Kenalog 40 mg/dL was injected. Pictures taken Completed without difficulty  Pain immediately resolved suggesting accurate placement of the medication.  Advised to call if fevers/chills, erythema, induration, drainage, or persistent bleeding.  Images permanently stored and available for review in the ultrasound unit.  Impression: Technically successful ultrasound guided injection.  Procedure note 97110; 15 minutes spent for Therapeutic exercises as stated in above notes.  This included exercises focusing on stretching, strengthening, with significant focus on eccentric aspects.  Hip strengthening exercises which included:  Pelvic tilt/bracing to help with proper recruitment of the lower abs and pelvic floor muscles  Glute strengthening to properly contract glutes without over-engaging low back and hamstrings - prone hip extension and glute bridge exercises Proper stretching techniques to increase effectiveness for the hip flexors, groin, quads, piriformis and low back when appropriate  Proper technique shown and discussed handout in great detail with ATC.  All questions were discussed and answered.      Impression and Recommendations:     This case required medical decision making of moderate complexity.

## 2015-08-10 NOTE — Assessment & Plan Note (Signed)
Exercises given specific for this. Patient tried topical anti-inflammatory does well. Differential does include a lumbar radiculopathy but negative straight leg test today. Patient will come back and see me again in 4 weeks for further evaluation. If continuing to have trouble possibly formal physical therapy.

## 2015-08-27 ENCOUNTER — Other Ambulatory Visit: Payer: Self-pay | Admitting: Internal Medicine

## 2015-08-30 NOTE — Telephone Encounter (Signed)
Done hardcopy to Dahlia  

## 2015-08-30 NOTE — Telephone Encounter (Signed)
Rx faxed to pharmacy  

## 2015-09-13 ENCOUNTER — Ambulatory Visit: Payer: Commercial Managed Care - HMO | Admitting: Family Medicine

## 2016-02-20 ENCOUNTER — Other Ambulatory Visit: Payer: Self-pay | Admitting: Orthopedic Surgery

## 2016-02-20 DIAGNOSIS — M25562 Pain in left knee: Secondary | ICD-10-CM

## 2016-02-29 ENCOUNTER — Ambulatory Visit
Admission: RE | Admit: 2016-02-29 | Discharge: 2016-02-29 | Disposition: A | Discharge status: Home / Self Care | Payer: Commercial Managed Care - HMO | Source: Ambulatory Visit | Attending: Orthopedic Surgery | Admitting: Orthopedic Surgery

## 2016-02-29 DIAGNOSIS — M25562 Pain in left knee: Secondary | ICD-10-CM

## 2016-03-10 ENCOUNTER — Ambulatory Visit
Admission: RE | Admit: 2016-03-10 | Discharge: 2016-03-10 | Disposition: A | Discharge status: Home / Self Care | Payer: Commercial Managed Care - HMO | Source: Ambulatory Visit | Attending: Orthopedic Surgery | Admitting: Orthopedic Surgery

## 2016-03-30 ENCOUNTER — Other Ambulatory Visit: Payer: Self-pay | Admitting: Orthopaedic Surgery

## 2016-03-30 DIAGNOSIS — M542 Cervicalgia: Secondary | ICD-10-CM

## 2016-04-08 ENCOUNTER — Ambulatory Visit
Admission: RE | Admit: 2016-04-08 | Discharge: 2016-04-08 | Disposition: A | Discharge status: Home / Self Care | Payer: Commercial Managed Care - HMO | Source: Ambulatory Visit | Attending: Orthopaedic Surgery | Admitting: Orthopaedic Surgery

## 2016-04-08 DIAGNOSIS — M542 Cervicalgia: Secondary | ICD-10-CM

## 2016-05-11 DIAGNOSIS — M5090 Cervical disc disorder, unspecified, unspecified cervical region: Secondary | ICD-10-CM | POA: Insufficient documentation

## 2016-06-05 ENCOUNTER — Encounter: Payer: Self-pay | Admitting: Internal Medicine

## 2016-06-05 ENCOUNTER — Ambulatory Visit (INDEPENDENT_AMBULATORY_CARE_PROVIDER_SITE_OTHER): Payer: Commercial Managed Care - HMO | Admitting: Internal Medicine

## 2016-06-05 VITALS — BP 132/68 | HR 102 | Temp 98.9°F | Resp 20 | Wt 311.0 lb

## 2016-06-05 DIAGNOSIS — F411 Generalized anxiety disorder: Secondary | ICD-10-CM

## 2016-06-05 DIAGNOSIS — Z20818 Contact with and (suspected) exposure to other bacterial communicable diseases: Secondary | ICD-10-CM

## 2016-06-05 DIAGNOSIS — J309 Allergic rhinitis, unspecified: Secondary | ICD-10-CM | POA: Diagnosis not present

## 2016-06-05 DIAGNOSIS — Z2089 Contact with and (suspected) exposure to other communicable diseases: Secondary | ICD-10-CM | POA: Diagnosis not present

## 2016-06-05 NOTE — Patient Instructions (Signed)
Please continue all other medications as before, and refills have been done if requested.  Please have the pharmacy call with any other refills you may need.  Please keep your appointments with your specialists as you may have planned  Your MRSA culture is sent  You will be contacted by phone if any changes need to be made immediately.  Otherwise, you will receive a letter about your results with an explanation, but please check with MyChart first.  Please remember to sign up for MyChart if you have not done so, as this will be important to you in the future with finding out test results, communicating by private email, and scheduling acute appointments online when needed.

## 2016-06-05 NOTE — Progress Notes (Signed)
Subjective:    Patient ID: [name], female    DOB: 25-Oct-1961, 55 y.o.   MRN: [id number]  HPI  Here to f/u, pt with recent procedure and MRSA checked before and after due to pt concern, reportedly neg for MRSA.  Pt now with left lower back skin bumps for 3 days with some drainage per pt, that may now be gone, but very concerned still about becoming MRSA +.  No fever, bumps have improved/resolved, Pt denies chest pain, increased sob or doe, wheezing, orthopnea, PND, increased LE swelling, palpitations, dizziness or syncope.   Pt denies fever, wt loss, night sweats, loss of appetite, or other constitutional symptoms   Pt denies polydipsia, polyuria, or low sugar symptoms such as weakness or confusion improved with po intake.  Pt states overall good compliance with meds.  Also, she has additional complaints of: Does have several wks ongoing nasal allergy symptoms with clearish congestion, itch and sneezing, without fever, pain, ST, cough. Past Medical History:  Diagnosis Date  . ALLERGIC RHINITIS 10/14/2008  . ANXIETY 10/14/2008  . ASTHMA 10/14/2008  . DEGENERATIVE JOINT DISEASE 10/14/2008  . GERD 10/14/2008  . HYPERLIPIDEMIA 10/14/2008  . INTERMITTENT VERTIGO 11/03/2008  . NEPHROLITHIASIS, HX OF 10/14/2008   Past Surgical History:  Procedure Laterality Date  . APPENDECTOMY    . CESAREAN SECTION    . s/p left knee surgery    . s/p left ulnar nerve surgery     x's 2  . s/p nasal surgery     x's 2  . s/p right knee surgery     x's 4- Cartilage torn  . TONSILLECTOMY      reports that she has never smoked. She does not have any smokeless tobacco history on file. Her alcohol and drug histories are not on file. family history includes Cancer in her maternal grandfather; Diabetes in her maternal grandfather. Allergies  Allergen Reactions  . Codeine     itching  . Tramadol Rash   Current Outpatient Prescriptions on File Prior to Visit  Medication Sig Dispense Refill  . indomethacin  (INDOCIN SR) 75 MG CR capsule TAKE 1 CAPSULE (75 MG TOTAL) BY MOUTH 2 (TWO) TIMES DAILY WITH A MEAL. AS NEEDED 60 capsule 11  . LYRICA 75 MG capsule TAKE ONE CAPSULE BY MOUTH AT BEDTIME AS NEEDED 90 capsule 1  . pantoprazole (PROTONIX) 40 MG tablet Take 1 tablet (40 mg total) by mouth daily. 90 tablet 3   No current facility-administered medications on file prior to visit.    Review of Systems  Constitutional: Negative for unusual diaphoresis or night sweats HENT: Negative for ear swelling or discharge Eyes: Negative for worsening visual haziness  Respiratory: Negative for choking and stridor.   Gastrointestinal: Negative for distension or worsening eructation Genitourinary: Negative for retention or change in urine volume.  Musculoskeletal: Negative for other MSK pain or swelling Skin: Negative for color change and worsening wound Neurological: Negative for tremors and numbness other than noted  Psychiatric/Behavioral: Negative for decreased concentration or agitation other than above       Objective:   Physical Exam BP 132/68   Pulse (!) 102   Temp 98.9 F (37.2 C) (Oral)   Resp 20   Wt (!) 311 lb (141.1 kg)   SpO2 97%   BMI 45.93 kg/m  VS noted,  Constitutional: Pt appears in no apparent distress HENT: Head: NCAT.  Right Ear: External ear normal.  Left Ear: External ear normal.  Bilat tm's  with mild erythema.  Max sinus areas non tender.  Pharynx with mild erythema, no exudate Eyes: . Pupils are equal, round, and reactive to light. Conjunctivae and EOM are normal Neck: Normal range of motion. Neck supple.  Cardiovascular: Normal rate and regular rhythm.   Pulmonary/Chest: Effort normal and breath sounds without rales or wheezing.  Abd:  Soft, NT, ND, + BS Neurological: Pt is alert. Not confused , motor grossly intact Skin: Skin is warm. No rash, no LE edema, left flank/lower back without significant skin lesion, ulcer or erythema, no swelling or other rash Psychiatric:  Pt behavior is normal. No agitation. 1+ nervous    Assessment & Plan:

## 2016-06-05 NOTE — Progress Notes (Signed)
Opened in error

## 2016-06-05 NOTE — Progress Notes (Signed)
Pre visit review using our clinic review tool, if applicable. No additional management support is needed unless otherwise documented below in the visit note. 

## 2016-06-09 ENCOUNTER — Other Ambulatory Visit: Payer: Self-pay | Admitting: Internal Medicine

## 2016-06-10 DIAGNOSIS — Z20818 Contact with and (suspected) exposure to other bacterial communicable diseases: Secondary | ICD-10-CM | POA: Insufficient documentation

## 2016-06-10 NOTE — Assessment & Plan Note (Signed)
mod, for rassurance, declines need for other counseling or tx, to f/u any worsening symptoms or concerns

## 2016-06-10 NOTE — Assessment & Plan Note (Signed)
Mild to mod, for otc zyrtec/nasacort asd,  to f/u any worsening symptoms or concerns 

## 2016-06-10 NOTE — Assessment & Plan Note (Signed)
Pt believes she was exposted to MRSA with recent exposure despite neg testing, no fever, apparently had skin lesions to left lower back now resolved in the past day, pt extremely concerned, did swab of area of concern for MRSA, pt to be notified if results abnormal or requires tx,  to f/u any worsening symptoms or concerns\

## 2016-06-12 ENCOUNTER — Telehealth: Payer: Self-pay | Admitting: Internal Medicine

## 2016-06-12 NOTE — Telephone Encounter (Signed)
Please call patient with test results.

## 2016-06-13 NOTE — Telephone Encounter (Signed)
Pt called back again about so test results. Please give her a call back thanks.

## 2016-06-13 NOTE — Telephone Encounter (Signed)
Letter sent to her on aug 6  All tests normal  Ok to re send letter if needed

## 2016-06-14 ENCOUNTER — Encounter: Payer: Self-pay | Admitting: Internal Medicine

## 2016-06-14 ENCOUNTER — Ambulatory Visit (INDEPENDENT_AMBULATORY_CARE_PROVIDER_SITE_OTHER): Payer: Commercial Managed Care - HMO | Admitting: Internal Medicine

## 2016-06-14 VITALS — BP 128/82 | HR 92 | Temp 98.3°F | Wt 327.0 lb

## 2016-06-14 DIAGNOSIS — R739 Hyperglycemia, unspecified: Secondary | ICD-10-CM

## 2016-06-14 DIAGNOSIS — J4520 Mild intermittent asthma, uncomplicated: Secondary | ICD-10-CM

## 2016-06-14 DIAGNOSIS — J309 Allergic rhinitis, unspecified: Secondary | ICD-10-CM | POA: Diagnosis not present

## 2016-06-14 DIAGNOSIS — R42 Dizziness and giddiness: Secondary | ICD-10-CM

## 2016-06-14 DIAGNOSIS — Z0001 Encounter for general adult medical examination with abnormal findings: Secondary | ICD-10-CM

## 2016-06-14 MED ORDER — MECLIZINE HCL 12.5 MG PO TABS
12.5000 mg | ORAL_TABLET | Freq: Three times a day (TID) | ORAL | 1 refills | Status: DC | PRN
Start: 2016-06-14 — End: 2017-02-22

## 2016-06-14 MED ORDER — METHYLPREDNISOLONE ACETATE 80 MG/ML IJ SUSP
80.0000 mg | Freq: Once | INTRAMUSCULAR | Status: AC
  Administered 2016-06-14: 80 mg via INTRAMUSCULAR

## 2016-06-14 MED ORDER — PREDNISONE 10 MG PO TABS
ORAL_TABLET | ORAL | 0 refills | Status: DC
Start: 2016-06-14 — End: 2016-06-29

## 2016-06-14 NOTE — Patient Instructions (Signed)
You had the steroid shot today  Please take all new medication as prescribed - the prednisone, and the meclizine for dizziness if needed  Please call in 1 week if not improved for ENT referral  Please continue all other medications as before, and refills have been done if requested.  Please have the pharmacy call with any other refills you may need.  Please continue your efforts at being more active, low cholesterol diet, and weight control.  You are otherwise up to date with prevention measures today.  Please keep your appointments with your specialists as you may have planned  Please go to the LAB in the Basement (turn left off the elevator) for the tests to be done today  You will be contacted by phone if any changes need to be made immediately.  Otherwise, you will receive a letter about your results with an explanation, but please check with MyChart first.  Please remember to sign up for MyChart if you have not done so, as this will be important to you in the future with finding out test results, communicating by private email, and scheduling acute appointments online when needed.  Please return in 1 year for your yearly visit, or sooner if needed, with Lab testing done 3-5 days before

## 2016-06-14 NOTE — Progress Notes (Addendum)
Subjective:    Patient ID: [name], female    DOB: [birth year], 55 y.o.   MRN: [id number]  HPI  Here for wellness and f/u;  Overall doing ok;  Pt denies Chest pain, worsening SOB, DOE, wheezing, orthopnea, PND, worsening LE edema, palpitations, dizziness or syncope.  Pt denies neurological change such as new headache, facial or extremity weakness.  Pt denies polydipsia, polyuria, or low sugar symptoms. Pt states overall good compliance with treatment and medications, good tolerability, and has been trying to follow appropriate diet.  Pt denies worsening depressive symptoms, suicidal ideation or panic. No fever, night sweats, wt loss, loss of appetite, or other constitutional symptoms.  Pt states good ability with ADL's, has low fall risk, home safety reviewed and adequate, no other significant changes in hearing or vision, and only occasionally active with exercise.  Also here to f/u after 3rd back surgury done July 11 overall doing ok except for mild residual pain, but also with several episodes of vertigo with n/v with HA after, o/w Pt denies new neurological symptoms such as new headache, or facial or extremity weakness or numbness  Pt denies chest pain, increased sob or doe, wheezing, orthopnea, PND, increased LE swelling, palpitations, dizziness or syncope.  Does have several wks ongoing nasal allergy symptoms with clearish congestion, itch and sneezing, without fever, pain, ST, cough, swelling or wheezing.  Also has persistent sense of throat swelling for many months, cont's to bother her, but remains mild, no adequate explanation, Denies worsening reflux, abd pain, dysphagia, n/v, bowel change or blood.   Past Medical History:  Diagnosis Date  . ALLERGIC RHINITIS 10/14/2008  . ANXIETY 10/14/2008  . ASTHMA 10/14/2008  . DEGENERATIVE JOINT DISEASE 10/14/2008  . GERD 10/14/2008  . HYPERLIPIDEMIA 10/14/2008  . INTERMITTENT VERTIGO 11/03/2008  . NEPHROLITHIASIS, HX OF 10/14/2008   Past Surgical  History:  Procedure Laterality Date  . APPENDECTOMY    . CESAREAN SECTION    . s/p left knee surgery    . s/p left ulnar nerve surgery     x's 2  . s/p nasal surgery     x's 2  . s/p right knee surgery     x's 4- Cartilage torn  . TONSILLECTOMY      reports that she has never smoked. She does not have any smokeless tobacco history on file. Her alcohol and drug histories are not on file. family history includes Cancer in her maternal grandfather; Diabetes in her maternal grandfather. Allergies  Allergen Reactions  . Codeine     itching  . Tramadol Rash   Current Outpatient Prescriptions on File Prior to Visit  Medication Sig Dispense Refill  . indomethacin (INDOCIN SR) 75 MG CR capsule TAKE 1 CAPSULE BY MOUTH 2 (TWO) TIMES DAILY WITH A MEAL. AS NEEDED 60 capsule 11  . LYRICA 75 MG capsule TAKE ONE CAPSULE BY MOUTH AT BEDTIME AS NEEDED 90 capsule 1  . pantoprazole (PROTONIX) 40 MG tablet Take 1 tablet (40 mg total) by mouth daily. 90 tablet 3   No current facility-administered medications on file prior to visit.    Review of Systems  Constitutional: Negative for increased diaphoresis, or other activity, appetite or siginficant weight change other than noted HENT: Negative for worsening hearing loss, ear pain, facial swelling, mouth sores and neck stiffness.   Eyes: Negative for other worsening pain, redness or visual disturbance.  Respiratory: Negative for choking or stridor Cardiovascular: Negative for other chest pain and palpitations.  Gastrointestinal:  Negative for worsening diarrhea, blood in stool, or abdominal distention Genitourinary: Negative for hematuria, flank pain or change in urine volume.  Musculoskeletal: Negative for myalgias or other joint complaints.  Skin: Negative for other color change and wound or drainage.  Neurological: Negative for syncope and numbness. other than noted Hematological: Negative for adenopathy. or other swelling Psychiatric/Behavioral:  Negative for hallucinations, SI, self-injury, decreased concentration or other worsening agitation.         Objective:   Physical Exam BP 128/82   Pulse 92   Temp 98.3 F (36.8 C)   Wt (!) 327 lb (148.3 kg)   SpO2 97%   BMI 48.29 kg/m  VS noted,  Constitutional: Pt appears in no apparent distress HENT: Head: NCAT.  Right Ear: External ear normal.  Left Ear: External ear normal.  Bilat tm's with mild erythema.  Max sinus areas non tender.  Pharynx with mild erythema, no exudate, no swelling or mass Eyes: . Pupils are equal, round, and reactive to light. Conjunctivae and EOM are normal Neck: Normal range of motion. Neck supple.  Cardiovascular: Normal rate and regular rhythm.   Pulmonary/Chest: Effort normal and breath sounds without rales or wheezing.  Abd:  Soft, NT, ND, + BS Neurological: Pt is alert. Not confused , motor 5/5 intact, sens intact to LT, gait normal, no nystagmus Skin: Skin is warm. No rash, no LE edema Psychiatric: Pt behavior is normal. No agitation.     Assessment & Plan:

## 2016-06-15 ENCOUNTER — Other Ambulatory Visit (INDEPENDENT_AMBULATORY_CARE_PROVIDER_SITE_OTHER): Payer: Commercial Managed Care - HMO

## 2016-06-15 ENCOUNTER — Encounter: Payer: Self-pay | Admitting: Internal Medicine

## 2016-06-15 DIAGNOSIS — R6889 Other general symptoms and signs: Secondary | ICD-10-CM | POA: Diagnosis not present

## 2016-06-15 DIAGNOSIS — Z0001 Encounter for general adult medical examination with abnormal findings: Secondary | ICD-10-CM

## 2016-06-15 DIAGNOSIS — R739 Hyperglycemia, unspecified: Secondary | ICD-10-CM | POA: Insufficient documentation

## 2016-06-15 LAB — LIPID PANEL
Cholesterol: 202 mg/dL — ABNORMAL HIGH (ref 0–200)
HDL: 71.1 mg/dL (ref >39.00)
LDL Cholesterol: 99 mg/dL (ref 0–99)
NONHDL: 131.33
Total CHOL/HDL Ratio: 3
Triglycerides: 161 mg/dL — ABNORMAL HIGH (ref 0.0–149.0)
VLDL: 32.2 mg/dL (ref 0.0–40.0)

## 2016-06-15 LAB — TSH: TSH: 0.74 u[IU]/mL (ref 0.35–4.50)

## 2016-06-15 LAB — URINALYSIS, ROUTINE W REFLEX MICROSCOPIC
Bilirubin Urine: NEGATIVE
KETONES UR: NEGATIVE
Leukocytes, UA: NEGATIVE
NITRITE: NEGATIVE
Total Protein, Urine: NEGATIVE
Urine Glucose: NEGATIVE
Urobilinogen, UA: 0.2 (ref 0.0–1.0)
pH: 5.5 (ref 5.0–8.0)

## 2016-06-15 LAB — BASIC METABOLIC PANEL
BUN: 11 mg/dL (ref 6–23)
CALCIUM: 9.4 mg/dL (ref 8.4–10.5)
CHLORIDE: 106 meq/L (ref 96–112)
CO2: 27 meq/L (ref 19–32)
Creatinine, Ser: 0.65 mg/dL (ref 0.40–1.20)
GFR: 101.13 mL/min (ref >60.00)
Glucose, Bld: 113 mg/dL — ABNORMAL HIGH (ref 70–99)
Potassium: 4.4 mEq/L (ref 3.5–5.1)
SODIUM: 140 meq/L (ref 135–145)

## 2016-06-15 LAB — HEPATIC FUNCTION PANEL
ALBUMIN: 3.9 g/dL (ref 3.5–5.2)
ALK PHOS: 85 U/L (ref 39–117)
ALT: 31 U/L (ref 0–35)
AST: 23 U/L (ref 0–37)
BILIRUBIN DIRECT: 0.1 mg/dL (ref 0.0–0.3)
TOTAL PROTEIN: 6.7 g/dL (ref 6.0–8.3)
Total Bilirubin: 0.6 mg/dL (ref 0.2–1.2)

## 2016-06-15 LAB — CBC WITH DIFFERENTIAL/PLATELET
BASOS ABS: 0.1 10*3/uL (ref 0.0–0.1)
Basophils Relative: 0.6 % (ref 0.0–3.0)
EOS ABS: 0.2 10*3/uL (ref 0.0–0.7)
Eosinophils Relative: 2.5 % (ref 0.0–5.0)
HCT: 43.1 % (ref 36.0–46.0)
Hemoglobin: 14.3 g/dL (ref 12.0–15.0)
LYMPHS ABS: 2.9 10*3/uL (ref 0.7–4.0)
Lymphocytes Relative: 32.4 % (ref 12.0–46.0)
MCHC: 33.1 g/dL (ref 30.0–36.0)
MCV: 85.1 fl (ref 78.0–100.0)
MONO ABS: 0.8 10*3/uL (ref 0.1–1.0)
MONOS PCT: 8.7 % (ref 3.0–12.0)
NEUTROS ABS: 4.9 10*3/uL (ref 1.4–7.7)
NEUTROS PCT: 55.8 % (ref 43.0–77.0)
PLATELETS: 390 10*3/uL (ref 150.0–400.0)
RBC: 5.06 Mil/uL (ref 3.87–5.11)
RDW: 13.1 % (ref 11.5–15.5)
WBC: 8.9 10*3/uL (ref 4.0–10.5)

## 2016-06-16 NOTE — Assessment & Plan Note (Signed)
Mild uncontrolled, for depomedrol IM, predpac asd,  to f/u any worsening symptoms or concerns

## 2016-06-16 NOTE — Assessment & Plan Note (Signed)
stable overall by history and exam, recent data reviewed with pt, and pt to continue medical treatment as before,  to f/u any worsening symptoms or concerns No results found for: HGBA1C  

## 2016-06-16 NOTE — Assessment & Plan Note (Signed)

## 2016-06-16 NOTE — Assessment & Plan Note (Addendum)
With n/v recurrent, c/w peripheral, for meclizine prn,  to f/u any worsening symptoms or concerns  In addition to the time spent performing CPE, I spent an additional 25 minutes face to face,in which greater than 50% of this time was spent in counseling and coordination of care for patient's acute illness as documented.

## 2016-06-16 NOTE — Assessment & Plan Note (Signed)
stable overall by history and exam, and pt to continue medical treatment as before,  to f/u any worsening symptoms or concerns 

## 2016-06-19 NOTE — Telephone Encounter (Signed)
Called patient, unable to reach, left message to give me a call back at [phone]

## 2016-06-21 ENCOUNTER — Ambulatory Visit: Payer: Commercial Managed Care - HMO | Admitting: Family Medicine

## 2016-06-27 ENCOUNTER — Telehealth: Payer: Self-pay

## 2016-06-27 NOTE — Telephone Encounter (Signed)
To be more specific, I stated to the staff to relay to eye center staff that I cannot order stat MRI even if I saw the patient that would likely get done in a timely manner.  Pt is instructed to go to ER for timely evaluation. If sh refuses I can see on Friday, but again still very unlikely to be able to get MRI in short order

## 2016-06-27 NOTE — Telephone Encounter (Signed)
Patient has called back in wanting to know about MRI.  DR. Jonny RuizJohn suggest she go to ER. Patient refused to go to ER.  I have scheduled patient with Dr. Jonny RuizJohn for next available on Friday.

## 2016-06-27 NOTE — Telephone Encounter (Signed)
Asher MuirJamie from [facility] called on patient and wanted her to be seen today for a MRI that Dr. Laural BenesJohnson at the eye care center wanted to be done. Per Dr. Jonny RuizJohn he is not her PCP and we have no openings today to her today she would have to go to the emergency room. For Dr. Jonny RuizJohn to order an MRI he stated that the patient would have to be seen. Eye center stated they would call patients PCP.

## 2016-06-28 ENCOUNTER — Encounter (INDEPENDENT_AMBULATORY_CARE_PROVIDER_SITE_OTHER): Payer: Self-pay | Admitting: Ophthalmology

## 2016-06-28 DIAGNOSIS — H43813 Vitreous degeneration, bilateral: Secondary | ICD-10-CM | POA: Diagnosis not present

## 2016-06-28 DIAGNOSIS — H353121 Nonexudative age-related macular degeneration, left eye, early dry stage: Secondary | ICD-10-CM

## 2016-06-28 DIAGNOSIS — H33303 Unspecified retinal break, bilateral: Secondary | ICD-10-CM | POA: Diagnosis not present

## 2016-06-29 ENCOUNTER — Ambulatory Visit (INDEPENDENT_AMBULATORY_CARE_PROVIDER_SITE_OTHER): Payer: Commercial Managed Care - HMO | Admitting: Internal Medicine

## 2016-06-29 ENCOUNTER — Encounter: Payer: Self-pay | Admitting: Internal Medicine

## 2016-06-29 VITALS — BP 128/82 | HR 88 | Temp 98.4°F | Resp 20 | Wt 312.0 lb

## 2016-06-29 DIAGNOSIS — R739 Hyperglycemia, unspecified: Secondary | ICD-10-CM | POA: Diagnosis not present

## 2016-06-29 DIAGNOSIS — R51 Headache: Secondary | ICD-10-CM | POA: Diagnosis not present

## 2016-06-29 DIAGNOSIS — H3530 Unspecified macular degeneration: Secondary | ICD-10-CM | POA: Diagnosis not present

## 2016-06-29 DIAGNOSIS — R42 Dizziness and giddiness: Secondary | ICD-10-CM | POA: Diagnosis not present

## 2016-06-29 DIAGNOSIS — R519 Headache, unspecified: Secondary | ICD-10-CM | POA: Insufficient documentation

## 2016-06-29 LAB — POCT GLYCOSYLATED HEMOGLOBIN (HGB A1C): HEMOGLOBIN A1C: 5.3

## 2016-06-29 NOTE — Assessment & Plan Note (Signed)
Has had ENT issues recent, ok for ENT referral per pt request

## 2016-06-29 NOTE — Patient Instructions (Signed)
Your A1c was OK today  Please continue all other medications as before, and refills have been done if requested.  Please have the pharmacy call with any other refills you may need.  Please continue your efforts at being more active, low cholesterol diet, and weight control.  You are otherwise up to date with prevention measures today.  Please keep your appointments with your specialists as you may have planned  You will be contacted regarding the referral for: Head MRI, and ENT

## 2016-06-29 NOTE — Progress Notes (Addendum)
Subjective:    Patient ID: [name], female    DOB: Mar 31, 1961, 55 y.o.   MRN: [id number]  HPI    Here to f/u ? Of papilledema by Dr Jan FiremanJohnson/optometry, called her for me to do simply what sounded like ordering a stat MRI which I cant do by phone request, and then saw Dr Matthews/opthomology who did complete exam and no papilledema by report or other significant abnormality.  Pt has see Dr Ashley Royaltymatthews in 2014 with bilateral retinal tears now improved.  Here as already made appt for ? Of need for MRI.  Pt still very concerned b/c Mother with brain tumor wit surgury but no XRT or CMT, pt suspects benign.  States having unusual HA's (noramlly has no HA's) quite frequent over the past 2-3 wks since last seen here, occurs at least every few days, seems to be whole head, usually mod to severe, intermittent, no photophobia except for once when sitting in her bedroom and light seemed suddenly severe to her with HA.  Can last 1/2 - 1 day, 2-3 tylenol can help, nothing other specifically seems to make worse.  This has only really started after the c-spine disc surgury July 11. Besides HA' s then, has recurring dizziness and nausea, with vomiting x 1 only.  No other unsuual neuro symptoms and Pt denies new neurological symptoms such as new facial or extremity weakness or numbness   Suggested by optho yest to see ENT regarding the dizziness and nausea.   Past Medical History:  Diagnosis Date  . ALLERGIC RHINITIS 10/14/2008  . ANXIETY 10/14/2008  . ASTHMA 10/14/2008  . DEGENERATIVE JOINT DISEASE 10/14/2008  . GERD 10/14/2008  . HYPERLIPIDEMIA 10/14/2008  . INTERMITTENT VERTIGO 11/03/2008  . NEPHROLITHIASIS, HX OF 10/14/2008   Past Surgical History:  Procedure Laterality Date  . APPENDECTOMY    . CESAREAN SECTION    . s/p left knee surgery    . s/p left ulnar nerve surgery     x's 2  . s/p nasal surgery     x's 2  . s/p right knee surgery     x's 4- Cartilage torn  . TONSILLECTOMY      reports that she  has never smoked. She does not have any smokeless tobacco history on file. Her alcohol and drug histories are not on file. family history includes Cancer in her maternal grandfather; Diabetes in her maternal grandfather. Allergies  Allergen Reactions  . Codeine     itching  . Tramadol Rash   Current Outpatient Prescriptions on File Prior to Visit  Medication Sig Dispense Refill  . indomethacin (INDOCIN SR) 75 MG CR capsule TAKE 1 CAPSULE BY MOUTH 2 (TWO) TIMES DAILY WITH A MEAL. AS NEEDED 60 capsule 11  . LYRICA 75 MG capsule TAKE ONE CAPSULE BY MOUTH AT BEDTIME AS NEEDED 90 capsule 1  . meclizine (ANTIVERT) 12.5 MG tablet Take 1 tablet (12.5 mg total) by mouth 3 (three) times daily as needed for dizziness. 30 tablet 1  . pantoprazole (PROTONIX) 40 MG tablet Take 1 tablet (40 mg total) by mouth daily. 90 tablet 3   No current facility-administered medications on file prior to visit.    Review of Systems  Constitutional: Negative for unusual diaphoresis or night sweats HENT: Negative for ear swelling or discharge Eyes: Negative for worsening visual haziness  Respiratory: Negative for choking and stridor.   Gastrointestinal: Negative for distension or worsening eructation Genitourinary: Negative for retention or change in urine volume.  Musculoskeletal: Negative for other MSK pain or swelling Skin: Negative for color change and worsening wound Neurological: Negative for tremors and numbness other than noted  Psychiatric/Behavioral: Negative for decreased concentration or agitation other than above       Objective:   Physical Exam BP 128/82   Pulse 88   Temp 98.4 F (36.9 C) (Oral)   Resp 20   Wt (!) 312 lb (141.5 kg)   SpO2 97%   BMI 46.07 kg/m  VS noted,  Constitutional: Pt appears in no apparent distress HENT: Head: NCAT.  Right Ear: External ear normal. , right TM clear Left Ear: External ear normal. left tm mild pink but no effusion or bulging, seems improved over last  visit Eyes: . Pupils are equal, round, and reactive to light. Conjunctivae and EOM are normal Neck: Normal range of motion. Neck supple.  Cardiovascular: Normal rate and regular rhythm.   Pulmonary/Chest: Effort normal and breath sounds without rales or wheezing.  Abd:  Soft, NT, ND, + BS Neurological: Pt is alert. Not confused , motor 5/5 intact, cn 2-12 intact, sens intact to LT, dtr symmetric, gait intact Skin: Skin is warm. No rash, no LE edema Psychiatric: Pt behavior is normal. No agitation.   Today A1c (POCT) -  Lab Results  Component Value Date   WBC 8.9 06/15/2016   HGB 14.3 06/15/2016   HCT 43.1 06/15/2016   PLT 390.0 06/15/2016   GLUCOSE 113 (H) 06/15/2016   CHOL 202 (H) 06/15/2016   TRIG 161.0 (H) 06/15/2016   HDL 71.10 06/15/2016   LDLDIRECT 109.3 09/06/2010   LDLCALC 99 06/15/2016   ALT 31 06/15/2016   AST 23 06/15/2016   NA 140 06/15/2016   K 4.4 06/15/2016   CL 106 06/15/2016   CREATININE 0.65 06/15/2016   BUN 11 06/15/2016   CO2 27 06/15/2016   TSH 0.74 06/15/2016   INR 0.90 11/14/2010   POCT HgB A1C  Order: [id number]  Status:  Final result Visible to patient:  No (Not Released) Dx:  Hyperglycemia   09:10  Hemoglobin A1C 5.3    Specimen Collected: 06/29/16 09:10 Last Resulted: 06/29/16 09:10             Assessment & Plan:

## 2016-06-29 NOTE — Assessment & Plan Note (Signed)
Minor, incidental on exam aug 17,2017 per Dr Alan MulderJohn Wallace, not functonally limiting, cont to follow with optho

## 2016-06-29 NOTE — Assessment & Plan Note (Signed)
Asympt, for a1c today in office, cont to work on diet, exercise, wt loss efforts

## 2016-06-29 NOTE — Assessment & Plan Note (Addendum)
No neuro changes, but new HA assoc with dizziness and n/v - cant r/o cns , for MRI head  Note:  Total time for pt hx, exam, review of record with pt in the room, determination of diagnoses and plan for further eval and tx is > 40 min, with over 50% spent in coordination and counseling of patient

## 2016-06-29 NOTE — Progress Notes (Signed)
Pre visit review using our clinic review tool, if applicable. No additional management support is needed unless otherwise documented below in the visit note. 

## 2016-07-09 DIAGNOSIS — G43109 Migraine with aura, not intractable, without status migrainosus: Secondary | ICD-10-CM | POA: Insufficient documentation

## 2016-07-10 ENCOUNTER — Ambulatory Visit
Admission: RE | Admit: 2016-07-10 | Discharge: 2016-07-10 | Disposition: A | Discharge status: Home / Self Care | Payer: Commercial Managed Care - HMO | Source: Ambulatory Visit | Attending: Internal Medicine | Admitting: Internal Medicine

## 2016-07-10 ENCOUNTER — Encounter: Payer: Self-pay | Admitting: Internal Medicine

## 2016-07-10 DIAGNOSIS — R42 Dizziness and giddiness: Secondary | ICD-10-CM

## 2016-07-10 DIAGNOSIS — R51 Headache: Principal | ICD-10-CM

## 2016-07-10 DIAGNOSIS — R519 Headache, unspecified: Secondary | ICD-10-CM

## 2016-07-25 ENCOUNTER — Other Ambulatory Visit: Payer: Self-pay | Admitting: Obstetrics and Gynecology

## 2016-07-25 DIAGNOSIS — Z1231 Encounter for screening mammogram for malignant neoplasm of breast: Secondary | ICD-10-CM

## 2016-08-08 ENCOUNTER — Ambulatory Visit: Payer: Commercial Managed Care - HMO

## 2016-08-16 ENCOUNTER — Encounter: Payer: Self-pay | Admitting: Radiology

## 2016-08-16 ENCOUNTER — Ambulatory Visit
Admission: RE | Admit: 2016-08-16 | Discharge: 2016-08-16 | Disposition: A | Discharge status: Home / Self Care | Payer: Commercial Managed Care - HMO | Source: Ambulatory Visit | Attending: Obstetrics and Gynecology | Admitting: Obstetrics and Gynecology

## 2016-08-16 DIAGNOSIS — Z1231 Encounter for screening mammogram for malignant neoplasm of breast: Secondary | ICD-10-CM

## 2016-10-12 ENCOUNTER — Encounter (HOSPITAL_COMMUNITY): Payer: Self-pay | Admitting: *Deleted

## 2016-10-12 ENCOUNTER — Emergency Department (HOSPITAL_COMMUNITY): Payer: Commercial Managed Care - HMO

## 2016-10-12 ENCOUNTER — Emergency Department (HOSPITAL_COMMUNITY)
Admission: EM | Admit: 2016-10-12 | Discharge: 2016-10-12 | Disposition: A | Discharge status: Home / Self Care | Payer: Commercial Managed Care - HMO | Attending: Emergency Medicine | Admitting: Emergency Medicine

## 2016-10-12 DIAGNOSIS — R079 Chest pain, unspecified: Secondary | ICD-10-CM | POA: Diagnosis not present

## 2016-10-12 DIAGNOSIS — J45909 Unspecified asthma, uncomplicated: Secondary | ICD-10-CM | POA: Diagnosis not present

## 2016-10-12 LAB — CBC WITH DIFFERENTIAL/PLATELET
Basophils Absolute: 0 10*3/uL (ref 0.0–0.1)
Basophils Relative: 1 %
EOS ABS: 0.2 10*3/uL (ref 0.0–0.7)
EOS PCT: 2 %
HCT: 43.5 % (ref 36.0–46.0)
Hemoglobin: 14.4 g/dL (ref 12.0–15.0)
LYMPHS ABS: 2.5 10*3/uL (ref 0.7–4.0)
Lymphocytes Relative: 32 %
MCH: 29.1 pg (ref 26.0–34.0)
MCHC: 33.1 g/dL (ref 30.0–36.0)
MCV: 87.9 fL (ref 78.0–100.0)
MONO ABS: 0.7 10*3/uL (ref 0.1–1.0)
MONOS PCT: 9 %
Neutro Abs: 4.4 10*3/uL (ref 1.7–7.7)
Neutrophils Relative %: 56 %
PLATELETS: 368 10*3/uL (ref 150–400)
RBC: 4.95 MIL/uL (ref 3.87–5.11)
RDW: 13.1 % (ref 11.5–15.5)
WBC: 7.8 10*3/uL (ref 4.0–10.5)

## 2016-10-12 LAB — COMPREHENSIVE METABOLIC PANEL
ALT: 36 U/L (ref 14–54)
ANION GAP: 7 (ref 5–15)
AST: 31 U/L (ref 15–41)
Albumin: 3.6 g/dL (ref 3.5–5.0)
Alkaline Phosphatase: 80 U/L (ref 38–126)
BUN: 10 mg/dL (ref 6–20)
CHLORIDE: 106 mmol/L (ref 101–111)
CO2: 25 mmol/L (ref 22–32)
Calcium: 9.5 mg/dL (ref 8.9–10.3)
Creatinine, Ser: 0.64 mg/dL (ref 0.44–1.00)
Glucose, Bld: 107 mg/dL — ABNORMAL HIGH (ref 65–99)
POTASSIUM: 4.2 mmol/L (ref 3.5–5.1)
SODIUM: 138 mmol/L (ref 135–145)
Total Bilirubin: 0.6 mg/dL (ref 0.3–1.2)
Total Protein: 6.2 g/dL — ABNORMAL LOW (ref 6.5–8.1)

## 2016-10-12 LAB — I-STAT TROPONIN, ED: Troponin i, poc: 0 ng/mL (ref 0.00–0.08)

## 2016-10-12 NOTE — ED Notes (Signed)
Patient transported to X-ray 

## 2016-10-12 NOTE — ED Notes (Signed)
Pt is in stable condition upon d/c and ambulates from ED. 

## 2016-10-12 NOTE — ED Triage Notes (Signed)
Pt arrives with c/o centralized CP that radiates to the left arm accompanied by nausea. Pt states this has been intermittent x2-3 days.

## 2016-10-12 NOTE — ED Provider Notes (Signed)
[facility]-EMERGENCY DEPT Provider Note   CSN: [id number] Arrival date & time: 10/12/16  0902     History   Chief Complaint Chief Complaint  Patient presents with  . Chest Pain    HPI [name] is a 56 y.o. female.  HPI Patient has had a dull mid chest pain that goes to the left arm. She's had on and off for last 2-3 days. Last few minutes. Comes on at rest and randomly. Not associated with exertion. She states it is a dull underlying pain. No fevers or chills. No cough. States she is on indomethacin for some chronic pains. Mild nausea without vomiting. No diaphoresis. No abdominal pain. No fevers. She has not had pains in this before. No previous cardiac history. She does not smoke. No early coronary disease in family members. She is obese. Pain is dull with some slight radiation to left arm.   Past Medical History:  Diagnosis Date  . ALLERGIC RHINITIS 10/14/2008  . ANXIETY 10/14/2008  . ASTHMA 10/14/2008  . DEGENERATIVE JOINT DISEASE 10/14/2008  . GERD 10/14/2008  . HYPERLIPIDEMIA 10/14/2008  . INTERMITTENT VERTIGO 11/03/2008  . NEPHROLITHIASIS, HX OF 10/14/2008    Patient Active Problem List   Diagnosis Date Noted  . Macular degeneration 06/29/2016  . Recurrent headache 06/29/2016  . Hyperglycemia 06/15/2016  . MRSA exposure 06/10/2016  . Acute bronchitis 11/03/2014  . Wheezing 11/03/2014  . Myalgia 10/06/2014  . Fever in adult 10/06/2014  . Muscle strain of right gluteal region 06/22/2014  . Greater trochanteric bursitis of right hip 05/17/2014  . Lateral epicondylitis of right elbow 06/24/2013  . Chronic pain 10/13/2012  . Vertigo 05/09/2012  . Encounter for well adult exam with abnormal findings 11/11/2011  . INTERMITTENT VERTIGO 11/03/2008  . HYPERLIPIDEMIA 10/14/2008  . Anxiety state 10/14/2008  . Allergic rhinitis 10/14/2008  . Asthma 10/14/2008  . GERD 10/14/2008  . Osteoarthritis 10/14/2008  . NEPHROLITHIASIS, HX OF 10/14/2008    Past Surgical History:    Procedure Laterality Date  . APPENDECTOMY    . CESAREAN SECTION    . s/p left knee surgery    . s/p left ulnar nerve surgery     x's 2  . s/p nasal surgery     x's 2  . s/p right knee surgery     x's 4- Cartilage torn  . TONSILLECTOMY      OB History    No data available       Home Medications    Prior to Admission medications   Medication Sig Start Date End Date Taking? Authorizing Provider  Collagen-Boron-Hyaluronic Acid ([employer] TRIPLE ACTION PO) Take 1 tablet by mouth daily.   Yes Historical Provider, MD  indomethacin (INDOCIN SR) 75 MG CR capsule TAKE 1 CAPSULE BY MOUTH 2 (TWO) TIMES DAILY WITH A MEAL. AS NEEDED 06/11/16  Yes Corwin Levins, MD  nortriptyline (PAMELOR) 10 MG capsule Take 10 mg by mouth daily. 08/29/16 08/29/17 Yes Historical Provider, MD  trolamine salicylate (ASPERCREME) 10 % cream Apply 1 application topically as needed for muscle pain.   Yes Historical Provider, MD  LYRICA 75 MG capsule TAKE ONE CAPSULE BY MOUTH AT BEDTIME AS NEEDED Patient not taking: Reported on 10/12/2016 08/30/15   Corwin Levins, MD  meclizine (ANTIVERT) 12.5 MG tablet Take 1 tablet (12.5 mg total) by mouth 3 (three) times daily as needed for dizziness. Patient not taking: Reported on 10/12/2016 06/14/16 06/14/17  Corwin Levins, MD  pantoprazole (PROTONIX) 40  MG tablet Take 1 tablet (40 mg total) by mouth daily. Patient not taking: Reported on 10/12/2016 06/14/15   Corwin LevinsJames W John, MD    Family History Family History  Problem Relation Age of Onset  . Diabetes Maternal Grandfather   . Cancer Maternal Grandfather     Social History Social History  Substance Use Topics  . Smoking status: Never Smoker  . Smokeless tobacco: Never Used  . Alcohol use No     Allergies   Codeine and Tramadol   Review of Systems Review of Systems  Constitutional: Negative for activity change.  HENT: Negative for congestion.   Eyes: Negative for pain.  Respiratory: Negative for chest tightness  and shortness of breath.   Cardiovascular: Positive for chest pain. Negative for leg swelling.  Gastrointestinal: Positive for nausea. Negative for abdominal pain, diarrhea and vomiting.  Genitourinary: Negative for flank pain.  Musculoskeletal: Negative for back pain and neck stiffness.  Skin: Negative for rash.  Neurological: Negative for weakness, numbness and headaches.  Psychiatric/Behavioral: Negative for behavioral problems.     Physical Exam Updated Vital Signs BP 166/73   Pulse 95   Temp 98.8 F (37.1 C) (Oral)   Resp 13   Ht 5\' 9"  (1.753 m)   Wt (!) 316 lb (143.3 kg)   LMP 06/10/2015   SpO2 97%   BMI 46.67 kg/m   Physical Exam  Constitutional: She appears well-developed.  HENT:  Head: Atraumatic.  Neck: Neck supple.  Cardiovascular: Normal rate.   Pulmonary/Chest: Effort normal. She exhibits no tenderness.  Abdominal: Soft. There is no tenderness.  Neurological: She is alert.  Skin: Skin is warm.  Psychiatric: She has a normal mood and affect.     ED Treatments / Results  Labs (all labs ordered are listed, but only abnormal results are displayed) Labs Reviewed  COMPREHENSIVE METABOLIC PANEL - Abnormal; Notable for the following:       Result Value   Glucose, Bld 107 (*)    Total Protein 6.2 (*)    All other components within normal limits  CBC WITH DIFFERENTIAL/PLATELET  Rosezena SensorI-STAT TROPOININ, ED  I-STAT TROPOININ, ED    EKG  EKG Interpretation  Date/Time:  Friday October 12 2016 09:08:15 EST Ventricular Rate:  91 PR Interval:  138 QRS Duration: 80 QT Interval:  330 QTC Calculation: 405 R Axis:   75 Text Interpretation:  Normal sinus rhythm No significant change since last tracing Confirmed by Rubin PayorPICKERING  MD, Harrold DonathNATHAN [phone]) on 10/12/2016 9:18:54 AM Also confirmed by Rubin PayorPICKERING  MD, Aeliana Spates [phone]), editor Martin LakeLOGAN, Cala BradfordKIMBERLY [phone])  on 10/12/2016 9:34:27 AM       Radiology Dg Chest 2 View  Result Date: 10/12/2016 CLINICAL DATA:  A chest pain and  heaviness with upper left arm cramping for the past 3 days. History of asthma. Nonsmoker. EXAM: CHEST  2 VIEW COMPARISON:  Report of a chest x-ray of April 20, 2003 FINDINGS: The lungs are adequately inflated. There is no focal infiltrate. There is no pleural effusion. The heart and pulmonary vascularity are normal. The mediastinum is normal in width. There is mild multilevel degenerative disc disease of the thoracic spine. IMPRESSION: There is no active cardiopulmonary disease. Electronically Signed   By: David  SwazilandJordan M.D.   On: 10/12/2016 10:05    Procedures Procedures (including critical care time)  Medications Ordered in ED Medications - No data to display   Initial Impression / Assessment and Plan / ED Course  I have reviewed the triage vital signs  and the nursing notes.  Pertinent labs & imaging results that were available during my care of the patient were reviewed by me and considered in my medical decision making (see chart for details).  Clinical Course     Patient with chest pain. EKG labs reassuring. Overall low risk besides her obesity. Doubt pulmonary embolism. Will discharge home follow-up with PCP as needed.  Final Clinical Impressions(s) / ED Diagnoses   Final diagnoses:  Nonspecific chest pain    New Prescriptions New Prescriptions   No medications on file     Benjiman CoreNathan Abenezer Odonell, MD 10/12/16 1110

## 2016-10-17 ENCOUNTER — Ambulatory Visit (INDEPENDENT_AMBULATORY_CARE_PROVIDER_SITE_OTHER): Payer: Commercial Managed Care - HMO | Admitting: Internal Medicine

## 2016-10-17 VITALS — BP 148/88 | HR 100 | Resp 20 | Wt 315.0 lb

## 2016-10-17 DIAGNOSIS — R739 Hyperglycemia, unspecified: Secondary | ICD-10-CM | POA: Diagnosis not present

## 2016-10-17 DIAGNOSIS — R079 Chest pain, unspecified: Secondary | ICD-10-CM | POA: Insufficient documentation

## 2016-10-17 DIAGNOSIS — Z0001 Encounter for general adult medical examination with abnormal findings: Secondary | ICD-10-CM

## 2016-10-17 DIAGNOSIS — K219 Gastro-esophageal reflux disease without esophagitis: Secondary | ICD-10-CM | POA: Diagnosis not present

## 2016-10-17 DIAGNOSIS — I10 Essential (primary) hypertension: Secondary | ICD-10-CM | POA: Insufficient documentation

## 2016-10-17 MED ORDER — VALSARTAN-HYDROCHLOROTHIAZIDE 80-12.5 MG PO TABS: 1.0000 | ORAL_TABLET | Freq: Every day | ORAL | 3 refills | Status: DC

## 2016-10-17 MED ORDER — PANTOPRAZOLE SODIUM 40 MG PO TBEC: 40.0000 mg | DELAYED_RELEASE_TABLET | Freq: Every day | ORAL | 3 refills | Status: DC

## 2016-10-17 NOTE — Progress Notes (Signed)
Subjective:    Patient ID: [name], female    DOB: 1961-02-25, 55 y.o.   MRN: [id number]  HPI  Here to f/u CP -   Patient has had a dull mid chest pain that goes to the left arm. She's had on and off for last 2-3 days. Last few minutes. Comes on at rest and randomly. Not associated with exertion. She states it is a dull underlying pain. No fevers or chills. No cough. States she is on indomethacin for some chronic pains. Mild nausea without vomiting. No diaphoresis. No abdominal pain but does have occas reflux after out of PPI.Marland Kitchen. No fevers. She has not had pains in this before. No previous cardiac history. She does not smoke. No early coronary disease in family members. She is obese. Pain is dull with some slight radiation to left arm. BP in ED elevted 150-170. depsite complete retirement and less stress, wt only up a few lbs.   Pt denies polydipsia, polyuria BP Readings from Last 3 Encounters:  10/17/16 (!) 148/88  10/12/16 165/81  06/29/16 128/82   Wt Readings from Last 3 Encounters:  10/17/16 (!) 315 lb (142.9 kg)  10/12/16 (!) 316 lb (143.3 kg)  07/10/16 (!) 312 lb (141.5 kg)  No furhter Cp since hosp   Needs left knee TKA but has deferred so far.  Has hx of atypical migraine where there is sudden onset dizziness with vomiting.   Past Medical History:  Diagnosis Date  . ALLERGIC RHINITIS 10/14/2008  . ANXIETY 10/14/2008  . ASTHMA 10/14/2008  . DEGENERATIVE JOINT DISEASE 10/14/2008  . GERD 10/14/2008  . HYPERLIPIDEMIA 10/14/2008  . INTERMITTENT VERTIGO 11/03/2008  . NEPHROLITHIASIS, HX OF 10/14/2008   Past Surgical History:  Procedure Laterality Date  . APPENDECTOMY    . CESAREAN SECTION    . s/p left knee surgery    . s/p left ulnar nerve surgery     x's 2  . s/p nasal surgery     x's 2  . s/p right knee surgery     x's 4- Cartilage torn  . TONSILLECTOMY      reports that she has never smoked. She has never used smokeless tobacco. She reports that she does not drink  alcohol. Her drug history is not on file. family history includes Cancer in her maternal grandfather; Diabetes in her maternal grandfather. Allergies  Allergen Reactions  . Codeine     itching  . Tramadol Rash   Current Outpatient Prescriptions on File Prior to Visit  Medication Sig Dispense Refill  . Collagen-Boron-Hyaluronic Acid ([employer] [facility] TRIPLE ACTION PO) Take 1 tablet by mouth daily.    . indomethacin (INDOCIN SR) 75 MG CR capsule TAKE 1 CAPSULE BY MOUTH 2 (TWO) TIMES DAILY WITH A MEAL. AS NEEDED 60 capsule 11  . nortriptyline (PAMELOR) 10 MG capsule Take 10 mg by mouth daily.    Marland Kitchen. trolamine salicylate (ASPERCREME) 10 % cream Apply 1 application topically as needed for muscle pain.    Marland Kitchen. LYRICA 75 MG capsule TAKE ONE CAPSULE BY MOUTH AT BEDTIME AS NEEDED (Patient not taking: Reported on 10/17/2016) 90 capsule 1  . meclizine (ANTIVERT) 12.5 MG tablet Take 1 tablet (12.5 mg total) by mouth 3 (three) times daily as needed for dizziness. (Patient not taking: Reported on 10/17/2016) 30 tablet 1   No current facility-administered medications on file prior to visit.     Review of Systems  Constitutional: Negative for unusual diaphoresis or night sweats HENT:  Negative for ear swelling or discharge Eyes: Negative for worsening visual haziness  Respiratory: Negative for choking and stridor.   Gastrointestinal: Negative for distension or worsening eructation Genitourinary: Negative for retention or change in urine volume.  Musculoskeletal: Negative for other MSK pain or swelling Skin: Negative for color change and worsening wound Neurological: Negative for tremors and numbness other than noted  Psychiatric/Behavioral: Negative for decreased concentration or agitation other than above   All other system neg per pt    Objective:   Physical Exam BP (!) 148/88   Pulse 100   Resp 20   Wt (!) 315 lb (142.9 kg)   LMP 06/10/2015   SpO2 96%   BMI 46.52 kg/m  VS noted,    Constitutional: Pt appears in no apparent distress HENT: Head: NCAT.  Right Ear: External ear normal.  Left Ear: External ear normal.  Eyes: . Pupils are equal, round, and reactive to light. Conjunctivae and EOM are normal Neck: Normal range of motion. Neck supple.  Cardiovascular: Normal rate and regular rhythm.   Pulmonary/Chest: Effort normal and breath sounds without rales or wheezing.  Abd:  Soft, NT, ND, + BS Neurological: Pt is alert. Not confused , motor grossly intact Skin: Skin is warm. No rash, no LE edema Psychiatric: Pt behavior is normal. No agitation.     Assessment & Plan:

## 2016-10-17 NOTE — Patient Instructions (Addendum)
Please take all new medication as prescribed - the new Blood Pressure medication  Please continue all other medications as before, and refills have been done if requested.  Please have the pharmacy call with any other refills you may need.  Please continue your efforts at being more active, low cholesterol diet, and weight control.  Please keep your appointments with your specialists as you may have planned  You will be contacted regarding the referral for: stress test  Please return in 6 months, or sooner if needed, with Lab testing done 3-5 days before

## 2016-10-17 NOTE — Progress Notes (Signed)
Pre visit review using our clinic review tool, if applicable. No additional management support is needed unless otherwise documented below in the visit note. 

## 2016-10-21 NOTE — Assessment & Plan Note (Signed)
Atypical , etiology unclear, for stress test referral

## 2016-10-21 NOTE — Assessment & Plan Note (Addendum)
Uncontrolled, to start diovan hct asd, o/w stable overall by history and exam, recent data reviewed with pt,  to f/u any worsening symptoms or concerns BP Readings from Last 3 Encounters:  10/17/16 (!) 148/88  10/12/16 165/81  06/29/16 128/82

## 2016-10-21 NOTE — Assessment & Plan Note (Signed)
Mild ot mod, for PPI restart,  to f/u any worsening symptoms or concerns

## 2016-10-21 NOTE — Assessment & Plan Note (Signed)
Asympt, stable overall by history and exam, recent data reviewed with pt, and pt to continue medical treatment as before,  to f/u any worsening symptoms or concerns Lab Results  Component Value Date   HGBA1C 5.3 06/29/2016

## 2016-11-01 ENCOUNTER — Telehealth (HOSPITAL_COMMUNITY): Payer: Self-pay | Admitting: Radiology

## 2016-11-01 ENCOUNTER — Telehealth (HOSPITAL_COMMUNITY): Payer: Self-pay | Admitting: *Deleted

## 2016-11-01 NOTE — Telephone Encounter (Signed)
Patient given detailed instructions per Myocardial Perfusion Study Information Sheet for the test on 11/07/2016 at 8:00. Patient notified to arrive 15 minutes early and that it is imperative to arrive on time for appointment to keep from having the test rescheduled.  If you need to cancel or reschedule your appointment, please call the office within 24 hours of your appointment. Failure to do so may result in a cancellation of your appointment, and a $50 no show fee. Patient verbalized understanding.EHK

## 2016-11-01 NOTE — Telephone Encounter (Signed)
Patient given detailed instructions per Myocardial Perfusion Study Information Sheet for the test on 11/08/16. Patient notified to arrive 15 minutes early and that it is imperative to arrive on time for appointment to keep from having the test rescheduled.  If you need to cancel or reschedule your appointment, please call the office within 24 hours of your appointment. Failure to do so may result in a cancellation of your appointment, and a $50 no show fee. Patient verbalized understanding. Ricky AlaSmith, Tiaira Arambula Jacqueline

## 2016-11-07 ENCOUNTER — Ambulatory Visit (HOSPITAL_COMMUNITY): Payer: Commercial Managed Care - HMO

## 2016-11-07 ENCOUNTER — Ambulatory Visit (HOSPITAL_COMMUNITY): Payer: Commercial Managed Care - HMO | Attending: Internal Medicine

## 2016-11-07 DIAGNOSIS — I10 Essential (primary) hypertension: Secondary | ICD-10-CM | POA: Diagnosis not present

## 2016-11-07 DIAGNOSIS — R079 Chest pain, unspecified: Secondary | ICD-10-CM | POA: Diagnosis not present

## 2016-11-07 DIAGNOSIS — R0609 Other forms of dyspnea: Secondary | ICD-10-CM | POA: Insufficient documentation

## 2016-11-07 MED ORDER — REGADENOSON 0.4 MG/5ML IV SOLN
0.4000 mg | Freq: Once | INTRAVENOUS | Status: AC
  Administered 2016-11-07: 0.4 mg via INTRAVENOUS

## 2016-11-07 MED ORDER — TECHNETIUM TC 99M TETROFOSMIN IV KIT
32.4000 | PACK | Freq: Once | INTRAVENOUS | Status: AC | PRN
  Administered 2016-11-07: 32.4 via INTRAVENOUS
  Filled 2016-11-07: qty 33

## 2016-11-08 ENCOUNTER — Ambulatory Visit (HOSPITAL_COMMUNITY): Payer: Commercial Managed Care - HMO | Attending: Cardiovascular Disease

## 2016-11-08 LAB — MYOCARDIAL PERFUSION IMAGING
CHL CUP NUCLEAR SDS: 5
LHR: 0.34
LV dias vol: 103 mL (ref 46–106)
LVSYSVOL: 46 mL
NUC STRESS TID: 0.98
Peak HR: 134 {beats}/min
Rest HR: 90 {beats}/min
SRS: 3
SSS: 8

## 2016-11-08 MED ORDER — TECHNETIUM TC 99M TETROFOSMIN IV KIT
32.5000 | PACK | Freq: Once | INTRAVENOUS | Status: AC | PRN
  Administered 2016-11-08: 32.5 via INTRAVENOUS
  Filled 2016-11-08: qty 33

## 2016-11-09 ENCOUNTER — Encounter: Payer: Self-pay | Admitting: Internal Medicine

## 2016-12-04 DIAGNOSIS — G43109 Migraine with aura, not intractable, without status migrainosus: Secondary | ICD-10-CM | POA: Diagnosis not present

## 2016-12-04 DIAGNOSIS — R42 Dizziness and giddiness: Secondary | ICD-10-CM | POA: Diagnosis not present

## 2017-02-22 ENCOUNTER — Encounter: Payer: Self-pay | Admitting: Internal Medicine

## 2017-02-22 ENCOUNTER — Ambulatory Visit (INDEPENDENT_AMBULATORY_CARE_PROVIDER_SITE_OTHER): Payer: Commercial Managed Care - HMO | Admitting: Internal Medicine

## 2017-02-22 ENCOUNTER — Other Ambulatory Visit (INDEPENDENT_AMBULATORY_CARE_PROVIDER_SITE_OTHER): Payer: Commercial Managed Care - HMO

## 2017-02-22 VITALS — BP 118/84 | HR 91 | Temp 98.5°F | Ht 69.0 in | Wt 309.0 lb

## 2017-02-22 DIAGNOSIS — Z0000 Encounter for general adult medical examination without abnormal findings: Secondary | ICD-10-CM | POA: Diagnosis not present

## 2017-02-22 DIAGNOSIS — R739 Hyperglycemia, unspecified: Secondary | ICD-10-CM

## 2017-02-22 DIAGNOSIS — Z1159 Encounter for screening for other viral diseases: Secondary | ICD-10-CM | POA: Diagnosis not present

## 2017-02-22 DIAGNOSIS — M170 Bilateral primary osteoarthritis of knee: Secondary | ICD-10-CM

## 2017-02-22 LAB — HEPATIC FUNCTION PANEL
ALBUMIN: 4.2 g/dL (ref 3.5–5.2)
ALK PHOS: 82 U/L (ref 39–117)
ALT: 54 U/L — ABNORMAL HIGH (ref 0–35)
AST: 36 U/L (ref 0–37)
Bilirubin, Direct: 0.1 mg/dL (ref 0.0–0.3)
Total Bilirubin: 0.6 mg/dL (ref 0.2–1.2)
Total Protein: 6.8 g/dL (ref 6.0–8.3)

## 2017-02-22 LAB — LIPID PANEL
CHOL/HDL RATIO: 3
Cholesterol: 201 mg/dL — ABNORMAL HIGH (ref 0–200)
HDL: 65 mg/dL (ref >39.00)
LDL Cholesterol: 96 mg/dL (ref 0–99)
NONHDL: 136.16
Triglycerides: 199 mg/dL — ABNORMAL HIGH (ref 0.0–149.0)
VLDL: 39.8 mg/dL (ref 0.0–40.0)

## 2017-02-22 LAB — BASIC METABOLIC PANEL
BUN: 16 mg/dL (ref 6–23)
CALCIUM: 9.8 mg/dL (ref 8.4–10.5)
CO2: 29 mEq/L (ref 19–32)
Chloride: 104 mEq/L (ref 96–112)
Creatinine, Ser: 0.67 mg/dL (ref 0.40–1.20)
GFR: 97.4 mL/min (ref >60.00)
Glucose, Bld: 127 mg/dL — ABNORMAL HIGH (ref 70–99)
Potassium: 4.4 mEq/L (ref 3.5–5.1)
SODIUM: 140 meq/L (ref 135–145)

## 2017-02-22 LAB — URINALYSIS, ROUTINE W REFLEX MICROSCOPIC
Bilirubin Urine: NEGATIVE
HGB URINE DIPSTICK: NEGATIVE
Leukocytes, UA: NEGATIVE
Nitrite: NEGATIVE
RBC / HPF: NONE SEEN (ref 0–?)
Specific Gravity, Urine: 1.025 (ref 1.000–1.030)
Total Protein, Urine: NEGATIVE
Urine Glucose: NEGATIVE
Urobilinogen, UA: 0.2 (ref 0.0–1.0)
pH: 5.5 (ref 5.0–8.0)

## 2017-02-22 LAB — CBC WITH DIFFERENTIAL/PLATELET
Basophils Absolute: 0.1 10*3/uL (ref 0.0–0.1)
Basophils Relative: 1.1 % (ref 0.0–3.0)
EOS PCT: 2.5 % (ref 0.0–5.0)
Eosinophils Absolute: 0.2 10*3/uL (ref 0.0–0.7)
HEMATOCRIT: 44.2 % (ref 36.0–46.0)
Hemoglobin: 14.7 g/dL (ref 12.0–15.0)
LYMPHS ABS: 2.9 10*3/uL (ref 0.7–4.0)
LYMPHS PCT: 35.4 % (ref 12.0–46.0)
MCHC: 33.3 g/dL (ref 30.0–36.0)
MCV: 86.8 fl (ref 78.0–100.0)
MONOS PCT: 9.3 % (ref 3.0–12.0)
Monocytes Absolute: 0.8 10*3/uL (ref 0.1–1.0)
NEUTROS ABS: 4.2 10*3/uL (ref 1.4–7.7)
Neutrophils Relative %: 51.7 % (ref 43.0–77.0)
PLATELETS: 443 10*3/uL — AB (ref 150.0–400.0)
RBC: 5.09 Mil/uL (ref 3.87–5.11)
RDW: 13.3 % (ref 11.5–15.5)
WBC: 8.1 10*3/uL (ref 4.0–10.5)

## 2017-02-22 LAB — TSH: TSH: 0.86 u[IU]/mL (ref 0.35–4.50)

## 2017-02-22 LAB — HEMOGLOBIN A1C: Hgb A1c MFr Bld: 5.5 % (ref 4.6–6.5)

## 2017-02-22 LAB — HEPATITIS C ANTIBODY: HCV Ab: NEGATIVE

## 2017-02-22 LAB — HIV ANTIBODY (ROUTINE TESTING W REFLEX): HIV: NONREACTIVE

## 2017-02-22 MED ORDER — PREGABALIN 75 MG PO CAPS: 75.0000 mg | ORAL_CAPSULE | Freq: Two times a day (BID) | ORAL | 1 refills | Status: DC

## 2017-02-22 NOTE — Patient Instructions (Addendum)
Please continue all other medications as before, and refills have been done if requested, including the lyrica  Please have the pharmacy call with any other refills you may need.  Please continue your efforts at being more active, low cholesterol diet, and weight control.  You are otherwise up to date with prevention measures today.  You will be contacted regarding the referral for: rheumatology  Please keep your appointments with your specialists as you may have planned  Please go to the LAB in the Basement (turn left off the elevator) for the tests to be done today  You will be contacted by phone if any changes need to be made immediately.  Otherwise, you will receive a letter about your results with an explanation, but please check with MyChart first.  Please remember to sign up for MyChart if you have not done so, as this will be important to you in the future with finding out test results, communicating by private email, and scheduling acute appointments online when needed.  Please return in 1 year for your yearly visit, or sooner if needed, with Lab testing done 3-5 days before

## 2017-02-22 NOTE — Assessment & Plan Note (Signed)
asympt, for a1c,  to f/u any worsening symptoms or concerns

## 2017-02-22 NOTE — Progress Notes (Signed)
Subjective:    Patient ID: [name], female    DOB: Sep 23, 1961, 56 y.o.   MRN: [id number]  HPI  Here for wellness and f/u;  Overall doing ok;  Pt denies Chest pain, worsening SOB, DOE, wheezing, orthopnea, PND, worsening LE edema, palpitations, dizziness or syncope.  Pt denies neurological change such as new headache, facial or extremity weakness.  Pt denies polydipsia, polyuria, or low sugar symptoms. Pt states overall good compliance with treatment and medications, good tolerability, and has been trying to follow appropriate diet.  Pt denies worsening depressive symptoms, suicidal ideation or panic. No fever, night sweats, wt loss, loss of appetite, or other constitutional symptoms.  Pt states good ability with ADL's, has low fall risk, home safety reviewed and adequate, no other significant changes in hearing or vision, and only occasionally active with exercise due to worsening orthopedic problems.  Hs been on indocin for several yrs for DJD and chronic neck pain after bextra taken off the market.  Now however even indocin is not working well, even taken with tylenol.  Primary pain is bilat shoulders, knees and hips. 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  Father died recently and mother with dementia so hands are full right now, but eager to get back to work asap.  Has seen ortho in past for left knee cartilage tears and arthritis.  Shoulders have FROm but hurt to raise overhead.  No fever, falling down, trauma.  Wt overall down several lbs though remains morbid obese Wt Readings from Last 3 Encounters:  02/22/17 (!) 309 lb (140.2 kg)  11/07/16 (!) 315 lb (142.9 kg)  10/17/16 (!) 315 lb (142.9 kg)   Past Medical History:  Diagnosis Date  . ALLERGIC RHINITIS 10/14/2008  . ANXIETY 10/14/2008  . ASTHMA 10/14/2008  . DEGENERATIVE JOINT DISEASE 10/14/2008  . GERD 10/14/2008  . HYPERLIPIDEMIA 10/14/2008  . INTERMITTENT VERTIGO 11/03/2008  . NEPHROLITHIASIS, HX  OF 10/14/2008   Past Surgical History:  Procedure Laterality Date  . APPENDECTOMY    . CESAREAN SECTION    . s/p left knee surgery    . s/p left ulnar nerve surgery     x's 2  . s/p nasal surgery     x's 2  . s/p right knee surgery     x's 4- Cartilage torn  . TONSILLECTOMY      reports that she has never smoked. She has never used smokeless tobacco. She reports that she does not drink alcohol. Her drug history is not on file. family history includes Cancer in her maternal grandfather; Diabetes in her maternal grandfather. Allergies  Allergen Reactions  . Codeine     itching  . Tramadol Rash   Current Outpatient Prescriptions on File Prior to Visit  Medication Sig Dispense Refill  . indomethacin (INDOCIN SR) 75 MG CR capsule TAKE 1 CAPSULE BY MOUTH 2 (TWO) TIMES DAILY WITH A MEAL. AS NEEDED 60 capsule 11  . nortriptyline (PAMELOR) 10 MG capsule Take 10 mg by mouth daily.    . pantoprazole (PROTONIX) 40 MG tablet Take 1 tablet (40 mg total) by mouth daily. 90 tablet 3  . trolamine salicylate (ASPERCREME) 10 % cream Apply 1 application topically as needed for muscle pain.    . valsartan-hydrochlorothiazide (DIOVAN HCT) 80-12.5 MG tablet Take 1 tablet by mouth daily. 90 tablet 3   No current facility-administered medications on file prior to visit.    Review of Systems Constitutional: Negative  for other unusual diaphoresis, sweats, appetite or weight changes HENT: Negative for other worsening hearing loss, ear pain, facial swelling, mouth sores or neck stiffness.   Eyes: Negative for other worsening pain, redness or other visual disturbance.  Respiratory: Negative for other stridor or swelling Cardiovascular: Negative for other palpitations or other chest pain  Gastrointestinal: Negative for worsening diarrhea or loose stools, blood in stool, distention or other pain Genitourinary: Negative for hematuria, flank pain or other change in urine volume.  Musculoskeletal: Negative for  myalgias or other joint swelling.  Skin: Negative for other color change, or other wound or worsening drainage.  Neurological: Negative for other syncope or numbness. Hematological: Negative for other adenopathy or swelling Psychiatric/Behavioral: Negative for hallucinations, other worsening agitation, SI, self-injury, or new decreased concentration All other system neg per pt    Objective:   Physical Exam BP 118/84   Pulse 91   Temp 98.5 F (36.9 C) (Oral)   Ht  (1.753 m)   Wt (!) 309 lb (140.2 kg)   LMP 06/10/2015   SpO2 99%   BMI 45.63 kg/m  VS noted, morbid obese Constitutional: Pt is oriented to person, place, and time. Appears well-developed and well-nourished, in no significant distress and comfortable Head: Normocephalic and atraumatic  Eyes: Conjunctivae and EOM are normal. Pupils are equal, round, and reactive to light Right Ear: External ear normal without discharge Left Ear: External ear normal without discharge Nose: Nose without discharge or deformity Mouth/Throat: Oropharynx is without other ulcerations and moist  Neck: Normal range of motion. Neck supple. No JVD present. No tracheal deviation present or significant neck LA or mass Cardiovascular: Normal rate, regular rhythm, normal heart sounds and intact distal pulses.   Pulmonary/Chest: WOB normal and breath sounds without rales or wheezing  Abdominal: Soft. Bowel sounds are normal. NT. No HSM  Musculoskeletal: Normal range of motion. Exhibits no edema but has bilat knee effusions left > right with crepitus and reduced ROM Lymphadenopathy: Has no other cervical adenopathy.  Neurological: Pt is alert and oriented to person, place, and time. Pt has normal reflexes. No cranial nerve deficit. Motor grossly intact, Gait intact Skin: Skin is warm and dry. No rash noted or new ulcerations Psychiatric:  Has normal mood and affect. Behavior is normal without agitation No other new exam findings    Assessment &  Plan:

## 2017-02-22 NOTE — Progress Notes (Signed)
Pre visit review using our clinic review tool, if applicable. No additional management support is needed unless otherwise documented below in the visit note. 

## 2017-02-22 NOTE — Assessment & Plan Note (Signed)

## 2017-02-22 NOTE — Assessment & Plan Note (Signed)
For med refills today including lyrica for chronic pain, also refer rheum per pt request

## 2017-04-23 DIAGNOSIS — M170 Bilateral primary osteoarthritis of knee: Secondary | ICD-10-CM | POA: Insufficient documentation

## 2017-04-23 NOTE — Progress Notes (Signed)
Office Visit Note  Patient: [name]             Date of Birth: 1961/01/12           MRN: [id number]             PCP: Corwin LevinsJohn, James W, MD Referring: Corwin LevinsJohn, James W, MD Visit Date: 04/26/2017 Occupation: Retired    Subjective:  Pain knee joints   History of Present Illness: [name] is a 56 y.o. female seen in consultation per request of her PCP. According to patient her symptoms started about 16 years ago. She states she had C-spine discectomy and in the post surgical. She was given Bextra. She states a week later she started experiencing generalized pain. She thought her pain was related to Bextra she came off Bextra and was switched to Indocin. She states she took Indocin for a while initially it helped and then it quit working. She tried Celebrex which was not effective. She still takes Indocin but does not get much relief from that. She has ongoing pain in her C-spine and lumbar spine. She also  reports discomfort in her bilateral shoulders, bilateral hip joints, bilateral knees and her ankles. She states her knee joints are very painful. She even hurts at rest. She also experiences some pain in the muscles of her lower extremities below her knee joint. She has occasional swelling in her knee joints. [facility] where she had MRI of her left knee joint and it revealed meniscal tear. She was given cortisone injection followed by Visco supplement injections which helped her. She recalls that the injections were about a year ago.   Activities of Daily Living:  Patient reports morning stiffness for 30 minutes.   Patient Reports nocturnal pain.  Difficulty dressing/grooming: Denies Difficulty climbing stairs: Reports Difficulty getting out of chair: Reports Difficulty using hands for taps, buttons, cutlery, and/or writing: Denies   Review of Systems  Constitutional: Positive for fatigue. Negative for night sweats, weight gain,  weight loss and weakness.  HENT: Negative for mouth sores, trouble swallowing, trouble swallowing, mouth dryness and nose dryness.   Eyes: Negative for pain, redness, visual disturbance and dryness.  Respiratory: Negative for cough, shortness of breath and difficulty breathing.   Cardiovascular: Negative for chest pain, palpitations, hypertension, irregular heartbeat and swelling in legs/feet.  Gastrointestinal: Negative for blood in stool, constipation and diarrhea.  Endocrine: Negative for increased urination.  Genitourinary: Negative for vaginal dryness.  Musculoskeletal: Positive for arthralgias, joint pain and morning stiffness. Negative for joint swelling, myalgias, muscle weakness, muscle tenderness and myalgias.  Skin: Negative for color change, rash, hair loss, skin tightness, ulcers and sensitivity to sunlight.  Allergic/Immunologic: Negative for susceptible to infections.  Neurological: Negative for dizziness, memory loss and night sweats.  Hematological: Negative for swollen glands.  Psychiatric/Behavioral: Negative for depressed mood and sleep disturbance. The patient is not nervous/anxious.     PMFS History:  Patient Active Problem List   Diagnosis Date Noted  . Primary osteoarthritis of both knees 04/23/2017  . Chest pain 10/17/2016  . Essential hypertension 10/17/2016  . Macular degeneration 06/29/2016  . Recurrent headache 06/29/2016  . Hyperglycemia 06/15/2016  . MRSA exposure 06/10/2016  . Acute bronchitis 11/03/2014  . Wheezing 11/03/2014  . Myalgia 10/06/2014  . Fever in adult 10/06/2014  . Muscle strain of right gluteal region 06/22/2014  . Greater trochanteric bursitis of right hip 05/17/2014  . Lateral epicondylitis  of right elbow 06/24/2013  . Chronic pain 10/13/2012  . Vertigo 05/09/2012  . Preventative health care 11/11/2011  . INTERMITTENT VERTIGO 11/03/2008  . HYPERLIPIDEMIA 10/14/2008  . Anxiety state 10/14/2008  . Allergic rhinitis 10/14/2008  .  Asthma 10/14/2008  . GERD 10/14/2008  . Osteoarthritis 10/14/2008  . NEPHROLITHIASIS, HX OF 10/14/2008    Past Medical History:  Diagnosis Date  . ALLERGIC RHINITIS 10/14/2008  . ANXIETY 10/14/2008  . ASTHMA 10/14/2008  . DEGENERATIVE JOINT DISEASE 10/14/2008  . GERD 10/14/2008  . HYPERLIPIDEMIA 10/14/2008  . INTERMITTENT VERTIGO 11/03/2008  . NEPHROLITHIASIS, HX OF 10/14/2008    Family History  Problem Relation Age of Onset  . Diabetes Maternal Grandfather   . Cancer Maternal Grandfather    Past Surgical History:  Procedure Laterality Date  . APPENDECTOMY    . CESAREAN SECTION    . s/p left knee surgery    . s/p left ulnar nerve surgery     x's 2  . s/p nasal surgery     x's 2  . s/p right knee surgery     x's 4- Cartilage torn  . TONSILLECTOMY     Social History   Social History Narrative  . No narrative on file     Objective: Vital Signs: BP 130/84   Pulse 84   Resp 16   Ht 5\' 9"  (1.753 m)   Wt (!) 310 lb (140.6 kg)   LMP 06/10/2015   BMI 45.78 kg/m    Physical Exam  Constitutional: She is oriented to person, place, and time. She appears well-developed and well-nourished.  HENT:  Head: Normocephalic and atraumatic.  Eyes: Conjunctivae and EOM are normal.  Neck: Normal range of motion.  Cardiovascular: Normal rate, regular rhythm, normal heart sounds and intact distal pulses.   Pulmonary/Chest: Effort normal and breath sounds normal.  Abdominal: Soft. Bowel sounds are normal.  Lymphadenopathy:    She has no cervical adenopathy.  Neurological: She is alert and oriented to person, place, and time.  Skin: Skin is warm and dry. Capillary refill takes less than 2 seconds.  Psychiatric: She has a normal mood and affect. Her behavior is normal.  Nursing note and vitals reviewed.    Musculoskeletal Exam: C-spine limited range of motion thoracic and lumbar spine fairly good range of motion. Shoulder joints, elbow joints, wrist joints, MCPs, PIPs, DIPs were all  good range of motion with no synovitis. She had discomfort with range of motion of her hip joints. She had discomfort with range of motion of bilateral knee joints with incomplete extension of her left knee joint. She is some warmth on palpation of her left knee joint. Ankle joints are good range of motion. She is some overcrowding of her toes. But no warmth swelling or effusion or tenderness was noted in her MTPs.  CDAI Exam: No CDAI exam completed.    Investigation: Findings:  02/22/2017 Hepatitis C Antibody negative,HIV antibody negative, TSH normal , LDL 96 Triglycerides 199    CBC Latest Ref Rng & Units 02/22/2017 10/12/2016 06/15/2016  WBC 4.0 - 10.5 K/uL 8.1 7.8 8.9  Hemoglobin 12.0 - 15.0 g/dL 16.1 09.6 04.5  Hematocrit 36.0 - 46.0 % 44.2 43.5 43.1  Platelets 150.0 - 400.0 K/uL 443.0(H) 368 390.0    CMP Latest Ref Rng & Units 02/22/2017 10/12/2016 06/15/2016  Glucose 70 - 99 mg/dL 409(W) 119(J) 478(G)  BUN 6 - 23 mg/dL 16 10 11   Creatinine 0.40 - 1.20 mg/dL 9.56 2.13 0.86  Sodium  135 - 145 mEq/L 140 138 140  Potassium 3.5 - 5.1 mEq/L 4.4 4.2 4.4  Chloride 96 - 112 mEq/L 104 106 106  CO2 19 - 32 mEq/L 29 25 27   Calcium 8.4 - 10.5 mg/dL 9.8 9.5 9.4  Total Protein 6.0 - 8.3 g/dL 6.8 1.6(X) 6.7  Total Bilirubin 0.2 - 1.2 mg/dL 0.6 0.6 0.6  Alkaline Phos 39 - 117 U/L 82 80 85  AST 0 - 37 U/L 36 31 23  ALT 0 - 35 U/L 54(H) 36 31   Imaging: Xr Hip Unilat W Or W/o Pelvis 2-3 Views Left  Result Date: 04/26/2017 No hip joint narrowing was noted. No SI joint narrowing was noted. X-ray of the hip joint was within normal limits  Xr Hip Unilat W Or W/o Pelvis 2-3 Views Right  Result Date: 04/26/2017 No joint space narrowing was noted. No SI joint narrowing was noted. Impression: X-ray of the hip joint was within normal limits  Xr Knee 3 View Left  Result Date: 04/26/2017 Severe medial compartment narrowing was noted. Median and lateral osteophytes were noted. Severe patellofemoral  narrowing was noted. Impression: Severe osteoarthritis of the knee joint and severe chondromalacia patella  Xr Knee 3 View Right  Result Date: 04/26/2017 Moderate to severe medial compartment narrowing with medial and lateral osteophytes. Severe, patellofemoral narrowing. Impression: Moderate to severe osteoarthritis of the knee joint and severe chondromalacia patella   Speciality Comments: No specialty comments available.    Procedures:  No procedures performed Allergies: Codeine and Tramadol   Assessment / Plan:     Visit Diagnoses: Primary osteoarthritis of both knees -Patient continues to have a lot of pain and discomfort in her knee joints. She states she has pain even at rest. She had good response to cortisone injection and Visco supplement injections about a year ago . Despite taking Indocin she's not getting much relief. She had history of left knee joint meniscal tear in the past. Plan: XR KNEE 3 VIEW RIGHT, XR KNEE 3 VIEW LEFT, right knee joint showed moderate to severe osteoarthritis and left knee joint showed severe osteoarthritis. She has bilateral severe chondromalacia patella. Sedimentation rate, Rheumatoid factor, Cyclic citrul peptide antibody, IgG, Uric acid  Pain of both hip joints. She does have discomfort coming out of car due to hip joint discomfort. - Plan: XR HIP UNILAT W OR W/O PELVIS 2-3 VIEWS RIGHT, XR HIP UNILAT W OR W/O PELVIS 2-3 VIEWS LEFT. The x-ray of the hip joints were unremarkable.  Greater trochanteric bursitis of right hip: Minimal tenderness  History of fusion of cervical spine - C4-C7 Dr. Noel Gerold: She has chronic pain  NEPHROLITHIASIS, HX OF: She has not had any recurrence and multiple years.  History of macular degeneration  Other chronic pain: She is on Indocin, nortriptyline, pregabalin  History of hypertension  History of hyperlipidemia  Other fatigue - Plan: CK    Orders: Orders Placed This Encounter  Procedures  . XR KNEE 3 VIEW  RIGHT  . XR KNEE 3 VIEW LEFT  . XR HIP UNILAT W OR W/O PELVIS 2-3 VIEWS RIGHT  . XR HIP UNILAT W OR W/O PELVIS 2-3 VIEWS LEFT  . Sedimentation rate  . CK  . Rheumatoid factor  . Cyclic citrul peptide antibody, IgG  . Uric acid   No orders of the defined types were placed in this encounter.   Face-to-face time spent with patient was 50 minutes. 50% of time was spent in counseling and coordination of care.  Follow-Up Instructions: Return for OA,DDD.   Pollyann Savoy, MD  Note - This record has been created using Animal nutritionist.  Chart creation errors have been sought, but may not always  have been located. Such creation errors do not reflect on  the standard of medical care.

## 2017-04-26 ENCOUNTER — Ambulatory Visit (INDEPENDENT_AMBULATORY_CARE_PROVIDER_SITE_OTHER): Payer: Commercial Managed Care - HMO | Admitting: Rheumatology

## 2017-04-26 ENCOUNTER — Encounter: Payer: Self-pay | Admitting: Rheumatology

## 2017-04-26 ENCOUNTER — Ambulatory Visit (INDEPENDENT_AMBULATORY_CARE_PROVIDER_SITE_OTHER): Payer: Self-pay

## 2017-04-26 ENCOUNTER — Ambulatory Visit (INDEPENDENT_AMBULATORY_CARE_PROVIDER_SITE_OTHER): Payer: Commercial Managed Care - HMO

## 2017-04-26 ENCOUNTER — Encounter (INDEPENDENT_AMBULATORY_CARE_PROVIDER_SITE_OTHER): Payer: Self-pay

## 2017-04-26 VITALS — BP 130/84 | HR 84 | Resp 16 | Ht 69.0 in | Wt 310.0 lb

## 2017-04-26 DIAGNOSIS — Z8669 Personal history of other diseases of the nervous system and sense organs: Secondary | ICD-10-CM | POA: Diagnosis not present

## 2017-04-26 DIAGNOSIS — M25552 Pain in left hip: Secondary | ICD-10-CM | POA: Diagnosis not present

## 2017-04-26 DIAGNOSIS — M7061 Trochanteric bursitis, right hip: Secondary | ICD-10-CM

## 2017-04-26 DIAGNOSIS — Z87442 Personal history of urinary calculi: Secondary | ICD-10-CM

## 2017-04-26 DIAGNOSIS — R5383 Other fatigue: Secondary | ICD-10-CM | POA: Diagnosis not present

## 2017-04-26 DIAGNOSIS — Z8679 Personal history of other diseases of the circulatory system: Secondary | ICD-10-CM | POA: Diagnosis not present

## 2017-04-26 DIAGNOSIS — G8929 Other chronic pain: Secondary | ICD-10-CM

## 2017-04-26 DIAGNOSIS — M170 Bilateral primary osteoarthritis of knee: Secondary | ICD-10-CM

## 2017-04-26 DIAGNOSIS — Z8639 Personal history of other endocrine, nutritional and metabolic disease: Secondary | ICD-10-CM | POA: Diagnosis not present

## 2017-04-26 DIAGNOSIS — M25551 Pain in right hip: Secondary | ICD-10-CM | POA: Diagnosis not present

## 2017-04-26 DIAGNOSIS — Z981 Arthrodesis status: Secondary | ICD-10-CM

## 2017-04-26 NOTE — Patient Instructions (Signed)
Natural anti-inflammatories  You can purchase these at [employer], [employer] or online.  . Turmeric (capsules)  . Ginger (ginger root or capsules)  . Omega 3 (Fish, flax seeds, chia seeds, walnuts, almonds)  . Tart cherry (dried or extract)   Patient should be under the care of a physician while taking these supplements. This may not be reproduced without the permission of Dr. Rayhana Slider.  

## 2017-04-27 LAB — URIC ACID: Uric Acid, Serum: 4.9 mg/dL (ref 2.5–7.0)

## 2017-04-27 LAB — CK: Total CK: 57 U/L (ref 29–143)

## 2017-04-27 LAB — SEDIMENTATION RATE: SED RATE: 9 mm/h (ref 0–30)

## 2017-04-27 LAB — RHEUMATOID FACTOR

## 2017-04-29 LAB — CYCLIC CITRUL PEPTIDE ANTIBODY, IGG

## 2017-04-29 NOTE — Progress Notes (Signed)
WNL

## 2017-05-21 DIAGNOSIS — M2241 Chondromalacia patellae, right knee: Secondary | ICD-10-CM | POA: Insufficient documentation

## 2017-05-21 DIAGNOSIS — M2242 Chondromalacia patellae, left knee: Secondary | ICD-10-CM

## 2017-05-21 NOTE — Progress Notes (Signed)
Office Visit Note  Patient: [name]             Date of Birth: [birth year]           MRN: [id number]             PCP: Corwin Levins, MD Referring: Corwin Levins, MD Visit Date: 05/23/2017 Occupation: @GUAROCC @    Subjective:  Pain in hips and knee joints   History of Present Illness: [name] is a 56 y.o. female with history of osteoarthritis. She continues to have pain and discomfort in her bilateral hips and bilateral knee joints. She has neck pain off and on. She's taking care of her mother who has dementia. She has occasional swelling in her left knee joint.  Activities of Daily Living:  Patient reports morning stiffness for 30 minutes.   Patient Denies nocturnal pain.  Difficulty dressing/grooming: Denies Difficulty climbing stairs: Reports Difficulty getting out of chair: Reports Difficulty using hands for taps, buttons, cutlery, and/or writing: Denies   Review of Systems  Constitutional: Positive for fatigue.  HENT: Negative for mouth sores and mouth dryness.   Eyes: Positive for dryness.  Respiratory: Negative.  Negative for cough, shortness of breath and difficulty breathing.   Cardiovascular: Negative.  Negative for palpitations, hypertension and irregular heartbeat.  Gastrointestinal: Negative.  Negative for blood in stool, constipation and diarrhea.  Endocrine: Negative.   Genitourinary: Negative.  Negative for nocturia.  Musculoskeletal: Positive for arthralgias, joint pain, joint swelling and morning stiffness. Negative for myalgias, muscle weakness and myalgias.  Skin: Negative.  Negative for color change, rash, Wallace loss, ulcers and sensitivity to sunlight.  Neurological: Negative.  Negative for headaches.  Hematological: Negative.  Negative for swollen glands.  Psychiatric/Behavioral: Positive for sleep disturbance. Negative for depressed mood. The patient is not nervous/anxious.     PMFS History:  Patient Active Problem List   Diagnosis Date  Noted  . 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 Primary osteoarthritis of both knees 04/23/2017  . Chest pain 10/17/2016  . Essential hypertension 10/17/2016  . Macular degeneration 06/29/2016  . Recurrent headache 06/29/2016  . Hyperglycemia 06/15/2016  . MRSA exposure 06/10/2016  . Acute bronchitis 11/03/2014  . Wheezing 11/03/2014  . Myalgia 10/06/2014  . Fever in adult 10/06/2014  . Muscle strain of right gluteal region 06/22/2014  . Greater trochanteric bursitis of right hip 05/17/2014  . Lateral epicondylitis of right elbow 06/24/2013  . Chronic pain 10/13/2012  . Vertigo 05/09/2012  . Preventative health care 11/11/2011  . INTERMITTENT VERTIGO 11/03/2008  . HYPERLIPIDEMIA 10/14/2008  . Anxiety state 10/14/2008  . Allergic rhinitis 10/14/2008  . Asthma 10/14/2008  . GERD 10/14/2008  . NEPHROLITHIASIS, HX OF 10/14/2008    Past Medical History:  Diagnosis Date  . ALLERGIC RHINITIS 10/14/2008  . ANXIETY 10/14/2008  . ASTHMA 10/14/2008  . DEGENERATIVE JOINT DISEASE 10/14/2008  . GERD 10/14/2008  . HYPERLIPIDEMIA 10/14/2008  . INTERMITTENT VERTIGO 11/03/2008  . NEPHROLITHIASIS, HX OF 10/14/2008    Family History  Problem Relation Age of Onset  . Diabetes Maternal Grandfather   . Cancer Maternal Grandfather    Past Surgical History:  Procedure Laterality Date  . APPENDECTOMY    . CESAREAN SECTION    . s/p left knee surgery    . s/p left ulnar nerve surgery     x's 2  . s/p nasal surgery     x's 2  . s/p right knee surgery  x's 4- Cartilage torn  . TONSILLECTOMY     Social History   Social History Narrative  . No narrative on file     Objective: Vital Signs: Resp 14   Ht 5\' 9"  (1.753 m)   Wt (!) 306 lb (138.8 kg)   LMP 06/10/2015   BMI 45.19 kg/m    Physical Exam  Constitutional: She is oriented to person, place, and time. She appears well-developed and well-nourished.  HENT:  Head: Normocephalic and atraumatic.  Eyes: Conjunctivae and EOM  are normal.  Neck: Normal range of motion.  Cardiovascular: Normal rate, regular rhythm, normal heart sounds and intact distal pulses.   Pulmonary/Chest: Effort normal and breath sounds normal.  Abdominal: Soft. Bowel sounds are normal.  Lymphadenopathy:    She has no cervical adenopathy.  Neurological: She is alert and oriented to person, place, and time.  Skin: Skin is warm and dry. Capillary refill takes less than 2 seconds.  Psychiatric: She has a normal mood and affect. Her behavior is normal.  Nursing note and vitals reviewed.    Musculoskeletal Exam: C-spine and thoracic lumbar spine good range of motion. She has some stiffness with range of motion of her C-spine. Shoulder joints elbow joints wrist joints are good range of motion. She some thickening of her DIP joints of her hands consistent with osteoarthritis. She is tenderness on palpation over right trochanteric bursa consistent with trochanteric bursitis. She has crepitus in her knee joints without any warmth swelling or effusion consistent with osteoarthritis.  CDAI Exam: No CDAI exam completed.    Investigation: Findings:  04/26/2017 CK ,CCP, Rheumatoid factor, Sedimentation rate, and Uric acid normal      Imaging: Xr Hip Unilat W Or W/o Pelvis 2-3 Views Left  Result Date: 04/26/2017 No hip joint narrowing was noted. No SI joint narrowing was noted. X-ray of the hip joint was within normal limits  Xr Hip Unilat W Or W/o Pelvis 2-3 Views Right  Result Date: 04/26/2017 No joint space narrowing was noted. No SI joint narrowing was noted. Impression: X-ray of the hip joint was within normal limits  Xr Knee 3 View Left  Result Date: 04/26/2017 Severe medial compartment narrowing was noted. Median and lateral osteophytes were noted. Severe patellofemoral narrowing was noted. Impression: Severe osteoarthritis of the knee joint and severe chondromalacia patella  Xr Knee 3 View Right  Result Date: 04/26/2017 Moderate to  severe medial compartment narrowing with medial and lateral osteophytes. Severe, patellofemoral narrowing. Impression: Moderate to severe osteoarthritis of the knee joint and severe chondromalacia patella   Speciality Comments: No specialty comments available.    Procedures:  Large Joint Inj Date/Time: 05/23/2017 12:43 PM Performed by: Pollyann Savoy Authorized by: Pollyann Savoy   Consent Given by:  Patient Site marked: the procedure site was marked   Timeout: prior to procedure the correct patient, procedure, and site was verified   Indications:  Pain Location:  Hip Site:  R greater trochanter Prep: patient was prepped and draped in usual sterile fashion   Needle Size:  27 G Needle Length:  1.5 inches Approach:  Lateral Ultrasound Guidance: No   Fluoroscopic Guidance: No   Arthrogram: No   Medications:  40 mg triamcinolone acetonide 40 MG/ML; 1.5 mL lidocaine 1 % Aspiration Attempted: No   Aspirate amount (mL):  0 Patient tolerance:  Patient tolerated the procedure well with no immediate complications   Allergies: Codeine and Tramadol   Assessment / Plan:     Visit Diagnoses: Trochanteric bursitis  of right hip -she has persistent pain in her right trochanteric area. After different treatment options were discussed the right trochanteric bursa was spent prepped and injected with cortisone as a described above. Plan: Ambulatory referral to Physical Therapy  Chondromalacia of both patellae chronic pain  Primary osteoarthritis of both knees: Chronic pain. Weight loss diet and exercise was discussed. All autoimmune workup was negative.  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 Noel Geroldohen  History of anxiety  History of asthma  History of hyperlipidemia  History of kidney stones  History of gastroesophageal reflux (GERD)    Orders: Orders Placed This Encounter  Procedures  . Large Joint Injection/Arthrocentesis  . Ambulatory  referral to Physical Therapy   No orders of the defined types were placed in this encounter.   Face-to-face time spent with patient was 20 minutes. 50% of time was spent in counseling and coordination of care.  Follow-Up Instructions: Return if symptoms worsen or fail to improve, for Osteoarthritis.   Pollyann SavoyShaili Wealthy Danielski, MD  Note - This record has been created using Animal [employer] software.  Chart creation errors have been sought, but may not always  have been located. Such creation errors do not reflect on  the standard of medical care.

## 2017-05-23 ENCOUNTER — Ambulatory Visit (INDEPENDENT_AMBULATORY_CARE_PROVIDER_SITE_OTHER): Payer: Commercial Managed Care - HMO | Admitting: Rheumatology

## 2017-05-23 ENCOUNTER — Encounter: Payer: Self-pay | Admitting: Rheumatology

## 2017-05-23 VITALS — BP 130/84 | HR 94 | Resp 14 | Ht 69.0 in | Wt 306.0 lb

## 2017-05-23 DIAGNOSIS — M2241 Chondromalacia patellae, right knee: Secondary | ICD-10-CM | POA: Diagnosis not present

## 2017-05-23 DIAGNOSIS — M170 Bilateral primary osteoarthritis of knee: Secondary | ICD-10-CM

## 2017-05-23 DIAGNOSIS — M5030 Other cervical disc degeneration, unspecified cervical region: Secondary | ICD-10-CM

## 2017-05-23 DIAGNOSIS — Z87442 Personal history of urinary calculi: Secondary | ICD-10-CM

## 2017-05-23 DIAGNOSIS — Z8719 Personal history of other diseases of the digestive system: Secondary | ICD-10-CM | POA: Diagnosis not present

## 2017-05-23 DIAGNOSIS — M47812 Spondylosis without myelopathy or radiculopathy, cervical region: Secondary | ICD-10-CM

## 2017-05-23 DIAGNOSIS — M7061 Trochanteric bursitis, right hip: Secondary | ICD-10-CM | POA: Diagnosis not present

## 2017-05-23 DIAGNOSIS — M2242 Chondromalacia patellae, left knee: Secondary | ICD-10-CM | POA: Diagnosis not present

## 2017-05-23 DIAGNOSIS — Z8639 Personal history of other endocrine, nutritional and metabolic disease: Secondary | ICD-10-CM

## 2017-05-23 DIAGNOSIS — Z8659 Personal history of other mental and behavioral disorders: Secondary | ICD-10-CM

## 2017-05-23 DIAGNOSIS — Z8709 Personal history of other diseases of the respiratory system: Secondary | ICD-10-CM | POA: Diagnosis not present

## 2017-05-23 MED ORDER — TRIAMCINOLONE ACETONIDE 40 MG/ML IJ SUSP
40.0000 mg | INTRAMUSCULAR | Status: AC | PRN
  Administered 2017-05-23: 40 mg via INTRA_ARTICULAR

## 2017-05-23 MED ORDER — LIDOCAINE HCL 1 % IJ SOLN
1.5000 mL | INTRAMUSCULAR | Status: AC | PRN
  Administered 2017-05-23: 1.5 mL

## 2017-05-23 NOTE — Patient Instructions (Addendum)
Knee Exercises Ask your health care provider which exercises are safe for you. Do exercises exactly as told by your health care provider and adjust them as directed. It is normal to feel mild stretching, pulling, tightness, or discomfort as you do these exercises, but you should stop right away if you feel sudden pain or your pain gets worse.Do not begin these exercises until told by your health care provider. STRETCHING AND RANGE OF MOTION EXERCISES These exercises warm up your muscles and joints and improve the movement and flexibility of your knee. These exercises also help to relieve pain, numbness, and tingling. Exercise A: Knee Extension, Prone 1. Lie on your abdomen on a bed. 2. Place your left / right knee just beyond the edge of the surface so your knee is not on the bed. You can put a towel under your left / right thigh just above your knee for comfort. 3. Relax your leg muscles and allow gravity to straighten your knee. You should feel a stretch behind your left / right knee. 4. Hold this position for __________ seconds. 5. Scoot up so your knee is supported between repetitions. Repeat __________ times. Complete this stretch __________ times a day. Exercise B: Knee Flexion, Active  1. Lie on your back with both knees straight. If this causes back discomfort, bend your left / right knee so your foot is flat on the floor. 2. Slowly slide your left / right heel back toward your buttocks until you feel a gentle stretch in the front of your knee or thigh. 3. Hold this position for __________ seconds. 4. Slowly slide your left / right heel back to the starting position. Repeat __________ times. Complete this exercise __________ times a day. Exercise C: Quadriceps, Prone  1. Lie on your abdomen on a firm surface, such as a bed or padded floor. 2. Bend your left / right knee and hold your ankle. If you cannot reach your ankle or pant leg, loop a belt around your foot and grab the belt  instead. 3. Gently pull your heel toward your buttocks. Your knee should not slide out to the side. You should feel a stretch in the front of your thigh and knee. 4. Hold this position for __________ seconds. Repeat __________ times. Complete this stretch __________ times a day. Exercise D: Hamstring, Supine 1. Lie on your back. 2. Loop a belt or towel over the ball of your left / right foot. The ball of your foot is on the walking surface, right under your toes. 3. Straighten your left / right knee and slowly pull on the belt to raise your leg until you feel a gentle stretch behind your knee. ? Do not let your left / right knee bend while you do this. ? Keep your other leg flat on the floor. 4. Hold this position for __________ seconds. Repeat __________ times. Complete this stretch __________ times a day. STRENGTHENING EXERCISES These exercises build strength and endurance in your knee. Endurance is the ability to use your muscles for a long time, even after they get tired. Exercise E: Quadriceps, Isometric  1. Lie on your back with your left / right leg extended and your other knee bent. Put a rolled towel or small pillow under your knee if told by your health care provider. 2. Slowly tense the muscles in the front of your left / right thigh. You should see your kneecap slide up toward your hip or see increased dimpling just above the knee. This   motion will push the back of the knee toward the floor. 3. For __________ seconds, keep the muscle as tight as you can without increasing your pain. 4. Relax the muscles slowly and completely. Repeat __________ times. Complete this exercise __________ times a day. Exercise F: Straight Leg Raises - Quadriceps 1. Lie on your back with your left / right leg extended and your other knee bent. 2. Tense the muscles in the front of your left / right thigh. You should see your kneecap slide up or see increased dimpling just above the knee. Your thigh may  even shake a bit. 3. Keep these muscles tight as you raise your leg 4-6 inches (10-15 cm) off the floor. Do not let your knee bend. 4. Hold this position for __________ seconds. 5. Keep these muscles tense as you lower your leg. 6. Relax your muscles slowly and completely after each repetition. Repeat __________ times. Complete this exercise __________ times a day. Exercise G: Hamstring, Isometric 1. Lie on your back on a firm surface. 2. Bend your left / right knee approximately __________ degrees. 3. Dig your left / right heel into the surface as if you are trying to pull it toward your buttocks. Tighten the muscles in the back of your thighs to dig as hard as you can without increasing any pain. 4. Hold this position for __________ seconds. 5. Release the tension gradually and allow your muscles to relax completely for __________ seconds after each repetition. Repeat __________ times. Complete this exercise __________ times a day. Exercise H: Hamstring Curls  If told by your health care provider, do this exercise while wearing ankle weights. Begin with __________ weights. Then increase the weight by 1 lb (0.5 kg) increments. Do not wear ankle weights that are more than __________. 1. Lie on your abdomen with your legs straight. 2. Bend your left / right knee as far as you can without feeling pain. Keep your hips flat against the floor. 3. Hold this position for __________ seconds. 4. Slowly lower your leg to the starting position.  Repeat __________ times. Complete this exercise __________ times a day. Exercise I: Squats (Quadriceps) 1. Stand in front of a table, with your feet and knees pointing straight ahead. You may rest your hands on the table for balance but not for support. 2. Slowly bend your knees and lower your hips like you are going to sit in a chair. ? Keep your weight over your heels, not over your toes. ? Keep your lower legs upright so they are parallel with the table  legs. ? Do not let your hips go lower than your knees. ? Do not bend lower than told by your health care provider. ? If your knee pain increases, do not bend as low. 3. Hold the squat position for __________ seconds. 4. Slowly push with your legs to return to standing. Do not use your hands to pull yourself to standing. Repeat __________ times. Complete this exercise __________ times a day. Exercise J: Wall Slides (Quadriceps)  1. Lean your back against a smooth wall or door while you walk your feet out 18-24 inches (46-61 cm) from it. 2. Place your feet hip-width apart. 3. Slowly slide down the wall or door until your knees bend __________ degrees. Keep your knees over your heels, not over your toes. Keep your knees in line with your hips. 4. Hold for __________ seconds. Repeat __________ times. Complete this exercise __________ times a day. Exercise K: Straight Leg Raises -   Hip Abductors 1. Lie on your side with your left / right leg in the top position. Lie so your head, shoulder, knee, and hip line up. You may bend your bottom knee to help you keep your balance. 2. Roll your hips slightly forward so your hips are stacked directly over each other and your left / right knee is facing forward. 3. Leading with your heel, lift your top leg 4-6 inches (10-15 cm). You should feel the muscles in your outer hip lifting. ? Do not let your foot drift forward. ? Do not let your knee roll toward the ceiling. 4. Hold this position for __________ seconds. 5. Slowly return your leg to the starting position. 6. Let your muscles relax completely after each repetition. Repeat __________ times. Complete this exercise __________ times a day. Exercise L: Straight Leg Raises - Hip Extensors 1. Lie on your abdomen on a firm surface. You can put a pillow under your hips if that is more comfortable. 2. Tense the muscles in your buttocks and lift your left / right leg about 4-6 inches (10-15 cm). Keep your knee  straight as you lift your leg. 3. Hold this position for __________ seconds. 4. Slowly lower your leg to the starting position. 5. Let your leg relax completely after each repetition. Repeat __________ times. Complete this exercise __________ times a day. This information is not intended to replace advice given to you by your health care provider. Make sure you discuss any questions you have with your health care provider. Document Released: 09/12/2005 Document Revised: 07/23/2016 Document Reviewed: 09/04/2015 Elsevier Interactive Patient Education  2018 [employer] Iliotibial Bursitis Rehab Ask your health care provider which exercises are safe for you. Do exercises exactly as told by your health care provider and adjust them as directed. It is normal to feel mild stretching, pulling, tightness, or discomfort as you do these exercises, but you should stop right away if you feel sudden pain or your pain gets worse.Do not begin these exercises until told by your health care provider. Stretching and range of motion exercises These exercises warm up your muscles and joints and improve the movement and flexibility of your leg. These exercises also help to relieve pain and stiffness. Exercise A: Quadriceps stretch, prone  1. Lie on your abdomen on a firm surface, such as a bed or padded floor. 2. Bend your left / right knee and hold your ankle. If you cannot reach your ankle or pant leg, loop a belt around your foot and grab the belt instead. 3. Gently pull your heel toward your buttocks. Your knee should not slide out to the side. You should feel a stretch in the front of your thigh and knee. 4. Hold this position for __________ seconds. Repeat __________ times. Complete this exercise __________ times a day. Exercise B: Lunge ( adductor stretch) 1. Stand and spread your legs about 3 feet (about 1 m) apart. Put your left / right leg slightly back for balance. 2. Lean away from your left / right  leg by bending your other knee and shifting your weight toward your bent knee. You may rest your hands on your thigh for balance. You should feel a stretch in your left / right inner thigh. 3. Hold for __________ seconds. Repeat __________ times. Complete this exercise __________ times a day. Exercise C: Hamstring stretch, supine  1. Lie on your back. 2. Hold both ends of a belt or towel as you loop it over the ball of   your left / right foot. The ball of your foot is on the walking surface, right under your toes. 3. Straighten your left / right knee and slowly pull on the belt to raise your leg. Stop when you feel a gentle stretch in the back of your left / right knee or thigh. ? Do not let your left / right knee bend. ? Keep your other leg flat on the floor. 4. Hold this position for __________ seconds. Repeat __________ times. Complete this exercise __________ times a day. Strengthening exercises These exercises build strength and endurance in your leg. Endurance is the ability to use your muscles for a long time, even after they get tired. Exercise D: Quadriceps wall slides  1. Lean your back against a smooth wall or door while you walk your feet out 18-24 inches (46-61 cm) from it. 2. Place your feet hip-width apart. 3. Slowly slide down the wall or door until your knees bend as far as told by your health care provider. Keep your knees over your heels, not your toes. Keep your knees in line with your hips. 4. Hold for __________ seconds. 5. Push through your heels to stand up to rest for __________ seconds after each repetition. Repeat __________ times. Complete this exercise __________ times a day. Exercise E: Straight leg raises ( hip abductors) 1. Lie on your side, with your left / right leg in the top position. Lie so your head, shoulder, knee, and hip line up with each other. You may bend your bottom knee to help you balance. 2. Lift your top leg 4-6 inches (10-15 cm) while keeping  your toes pointed straight ahead. 3. Hold this position for __________ seconds. 4. Slowly lower your leg to the starting position. Allow your muscles to relax completely after each repetition. Repeat __________ times. Complete this exercise __________ times a day. Exercise F: Straight leg raises ( hip extensors) 1. Lie on your abdomen on a firm surface. You can put a pillow under your hips if that is more comfortable. 2. Tense the muscles in your buttocks and lift your left / right leg about 4-6 inches (10-15 cm). Keep your knee straight as you lift your leg. 3. Hold this position for __________ seconds. 4. Slowly lower your leg to the starting position. 5. Let your leg relax completely after each repetition. Repeat __________ times. Complete this exercise __________ times a day. Exercise G: Bridge ( hip extensors) 1. Lie on your back on a firm surface with your knees bent and your feet flat on the floor. 2. Tighten your buttocks muscles and lift your bottom off the floor until your trunk is level with your thighs. ? Do not arch your back. ? You should feel the muscles working in your buttocks and the back of your thighs. If you do not feel these muscles, slide your feet 1-2 inches (2.5-5 cm) farther away from your buttocks. 3. Hold this position for __________ seconds. 4. Slowly lower your hips to the starting position. 5. Let your buttocks muscles relax completely between repetitions. 6. If this exercise is too easy, try doing it with your arms crossed over your chest. Repeat __________ times. Complete this exercise __________ times a day. This information is not intended to replace advice given to you by your health care provider. Make sure you discuss any questions you have with your health care provider. Document Released: 10/29/2005 Document Revised: 07/05/2016 Document Reviewed: 10/11/2015 Elsevier Interactive Patient Education  2018 [employer]  

## 2017-06-06 ENCOUNTER — Other Ambulatory Visit: Payer: Self-pay | Admitting: Internal Medicine

## 2017-06-19 ENCOUNTER — Telehealth: Payer: Self-pay | Admitting: Internal Medicine

## 2017-06-19 MED ORDER — LOSARTAN POTASSIUM-HCTZ 50-12.5 MG PO TABS: 1.0000 | ORAL_TABLET | Freq: Every day | ORAL | 2 refills | Status: DC

## 2017-06-19 NOTE — Telephone Encounter (Signed)
Ok to change to low dose hyzaar - done erx

## 2017-06-19 NOTE — Telephone Encounter (Signed)
valsartan-hydrochlorothiazide (DIOVAN HCT) 80-12.5 MG tablet   Patient is calling about the recall on this medication. Is this one of them that is recalled?  If so she would like something else called in. Please advise. Thank you.

## 2017-06-20 NOTE — Telephone Encounter (Signed)
Called pt no answer LMOM MD sent new BP med to [facility]...Raechel Chute/lmb

## 2017-08-19 ENCOUNTER — Other Ambulatory Visit: Payer: Self-pay | Admitting: Obstetrics & Gynecology

## 2017-08-19 DIAGNOSIS — Z1231 Encounter for screening mammogram for malignant neoplasm of breast: Secondary | ICD-10-CM

## 2017-08-19 DIAGNOSIS — M1712 Unilateral primary osteoarthritis, left knee: Secondary | ICD-10-CM | POA: Diagnosis not present

## 2017-08-20 DIAGNOSIS — Z01419 Encounter for gynecological examination (general) (routine) without abnormal findings: Secondary | ICD-10-CM | POA: Diagnosis not present

## 2017-08-29 DIAGNOSIS — M170 Bilateral primary osteoarthritis of knee: Secondary | ICD-10-CM | POA: Diagnosis not present

## 2017-09-02 ENCOUNTER — Ambulatory Visit
Admission: RE | Admit: 2017-09-02 | Discharge: 2017-09-02 | Disposition: A | Discharge status: Home / Self Care | Payer: Commercial Managed Care - HMO | Source: Ambulatory Visit | Attending: Obstetrics & Gynecology | Admitting: Obstetrics & Gynecology

## 2017-09-02 DIAGNOSIS — Z1231 Encounter for screening mammogram for malignant neoplasm of breast: Secondary | ICD-10-CM

## 2017-09-05 DIAGNOSIS — M170 Bilateral primary osteoarthritis of knee: Secondary | ICD-10-CM | POA: Diagnosis not present

## 2017-09-12 DIAGNOSIS — Z23 Encounter for immunization: Secondary | ICD-10-CM | POA: Diagnosis not present

## 2017-09-12 DIAGNOSIS — M170 Bilateral primary osteoarthritis of knee: Secondary | ICD-10-CM | POA: Diagnosis not present

## 2017-12-09 ENCOUNTER — Other Ambulatory Visit: Payer: Self-pay | Admitting: Internal Medicine

## 2018-01-08 DIAGNOSIS — R82998 Other abnormal findings in urine: Secondary | ICD-10-CM | POA: Diagnosis not present

## 2018-01-08 DIAGNOSIS — R319 Hematuria, unspecified: Secondary | ICD-10-CM | POA: Diagnosis not present

## 2018-01-10 DIAGNOSIS — N200 Calculus of kidney: Secondary | ICD-10-CM | POA: Diagnosis not present

## 2018-01-10 DIAGNOSIS — R310 Gross hematuria: Secondary | ICD-10-CM | POA: Diagnosis not present

## 2018-03-22 ENCOUNTER — Other Ambulatory Visit: Payer: Self-pay | Admitting: Internal Medicine

## 2018-04-01 ENCOUNTER — Other Ambulatory Visit: Payer: Self-pay | Admitting: Internal Medicine

## 2018-04-25 DIAGNOSIS — G43109 Migraine with aura, not intractable, without status migrainosus: Secondary | ICD-10-CM | POA: Diagnosis not present

## 2018-05-01 ENCOUNTER — Ambulatory Visit (INDEPENDENT_AMBULATORY_CARE_PROVIDER_SITE_OTHER): Payer: 59 | Admitting: Internal Medicine

## 2018-05-01 ENCOUNTER — Other Ambulatory Visit (INDEPENDENT_AMBULATORY_CARE_PROVIDER_SITE_OTHER): Payer: 59

## 2018-05-01 ENCOUNTER — Encounter: Payer: Self-pay | Admitting: Internal Medicine

## 2018-05-01 VITALS — BP 128/86 | HR 115 | Temp 98.0°F | Ht 69.0 in | Wt 288.0 lb

## 2018-05-01 DIAGNOSIS — M79604 Pain in right leg: Secondary | ICD-10-CM | POA: Diagnosis not present

## 2018-05-01 DIAGNOSIS — R739 Hyperglycemia, unspecified: Secondary | ICD-10-CM

## 2018-05-01 DIAGNOSIS — Z0000 Encounter for general adult medical examination without abnormal findings: Secondary | ICD-10-CM

## 2018-05-01 DIAGNOSIS — I10 Essential (primary) hypertension: Secondary | ICD-10-CM | POA: Diagnosis not present

## 2018-05-01 DIAGNOSIS — M79605 Pain in left leg: Secondary | ICD-10-CM | POA: Diagnosis not present

## 2018-05-01 LAB — BASIC METABOLIC PANEL
BUN: 13 mg/dL (ref 6–23)
CHLORIDE: 104 meq/L (ref 96–112)
CO2: 26 mEq/L (ref 19–32)
CREATININE: 0.6 mg/dL (ref 0.40–1.20)
Calcium: 9.9 mg/dL (ref 8.4–10.5)
GFR: 110.14 mL/min (ref >60.00)
Glucose, Bld: 100 mg/dL — ABNORMAL HIGH (ref 70–99)
POTASSIUM: 3.9 meq/L (ref 3.5–5.1)
Sodium: 139 mEq/L (ref 135–145)

## 2018-05-01 LAB — URINALYSIS, ROUTINE W REFLEX MICROSCOPIC
Bilirubin Urine: NEGATIVE
Ketones, ur: NEGATIVE
Leukocytes, UA: NEGATIVE
NITRITE: NEGATIVE
PH: 5.5 (ref 5.0–8.0)
TOTAL PROTEIN, URINE-UPE24: NEGATIVE
Urine Glucose: NEGATIVE
Urobilinogen, UA: 0.2 (ref 0.0–1.0)

## 2018-05-01 LAB — CBC WITH DIFFERENTIAL/PLATELET
BASOS PCT: 0.9 % (ref 0.0–3.0)
Basophils Absolute: 0.1 10*3/uL (ref 0.0–0.1)
EOS PCT: 2.5 % (ref 0.0–5.0)
Eosinophils Absolute: 0.3 10*3/uL (ref 0.0–0.7)
HEMATOCRIT: 43.8 % (ref 36.0–46.0)
Hemoglobin: 15 g/dL (ref 12.0–15.0)
LYMPHS PCT: 30.2 % (ref 12.0–46.0)
Lymphs Abs: 3.4 10*3/uL (ref 0.7–4.0)
MCHC: 34.2 g/dL (ref 30.0–36.0)
MCV: 85.6 fl (ref 78.0–100.0)
Monocytes Absolute: 1 10*3/uL (ref 0.1–1.0)
Monocytes Relative: 8.8 % (ref 3.0–12.0)
NEUTROS ABS: 6.4 10*3/uL (ref 1.4–7.7)
Neutrophils Relative %: 57.6 % (ref 43.0–77.0)
PLATELETS: 468 10*3/uL — AB (ref 150.0–400.0)
RBC: 5.11 Mil/uL (ref 3.87–5.11)
RDW: 13.6 % (ref 11.5–15.5)
WBC: 11.1 10*3/uL — ABNORMAL HIGH (ref 4.0–10.5)

## 2018-05-01 LAB — HEPATIC FUNCTION PANEL
ALBUMIN: 4.5 g/dL (ref 3.5–5.2)
ALT: 30 U/L (ref 0–35)
AST: 22 U/L (ref 0–37)
Alkaline Phosphatase: 81 U/L (ref 39–117)
Bilirubin, Direct: 0.2 mg/dL (ref 0.0–0.3)
TOTAL PROTEIN: 7.4 g/dL (ref 6.0–8.3)
Total Bilirubin: 0.8 mg/dL (ref 0.2–1.2)

## 2018-05-01 LAB — LIPID PANEL
CHOLESTEROL: 218 mg/dL — AB (ref 0–200)
HDL: 84 mg/dL (ref >39.00)
LDL CALC: 114 mg/dL — AB (ref 0–99)
NonHDL: 133.7
TRIGLYCERIDES: 99 mg/dL (ref 0.0–149.0)
Total CHOL/HDL Ratio: 3
VLDL: 19.8 mg/dL (ref 0.0–40.0)

## 2018-05-01 LAB — TSH: TSH: 0.75 u[IU]/mL (ref 0.35–4.50)

## 2018-05-01 LAB — HEMOGLOBIN A1C: Hgb A1c MFr Bld: 5.3 % (ref 4.6–6.5)

## 2018-05-01 MED ORDER — LOSARTAN POTASSIUM-HCTZ 50-12.5 MG PO TABS: 1.0000 | ORAL_TABLET | Freq: Every day | ORAL | 3 refills | Status: DC

## 2018-05-01 MED ORDER — INDOMETHACIN ER 75 MG PO CPCR: ORAL_CAPSULE | ORAL | 5 refills | Status: DC

## 2018-05-01 NOTE — Patient Instructions (Signed)
Please continue all other medications as before, and refills have been done if requested.  Please have the pharmacy call with any other refills you may need.  Please continue your efforts at being more active, low cholesterol diet, and weight control.  You are otherwise up to date with prevention measures today.  Please keep your appointments with your specialists as you may have planned  You will be contacted regarding the referral for: leg circulation test  Please go to the LAB in the Basement (turn left off the elevator) for the tests to be done today  You will be contacted by phone if any changes need to be made immediately.  Otherwise, you will receive a letter about your results with an explanation, but please check with MyChart first.  Please remember to sign up for MyChart if you have not done so, as this will be important to you in the future with finding out test results, communicating by private email, and scheduling acute appointments online when needed.  Please return in 1 year for your yearly visit, or sooner if needed, with Lab testing done 3-5 days before

## 2018-05-01 NOTE — Progress Notes (Signed)
Subjective:    Patient ID: [name], female    DOB: 17-Mar-1961, 57 y.o.   MRN: [id number]  HPI  Here for wellness and f/u;  Overall doing ok;  Pt denies Chest pain, worsening SOB, DOE, wheezing, orthopnea, PND, worsening LE edema, palpitations, dizziness or syncope.  Pt denies neurological change such as new headache, facial or extremity weakness.  Pt denies polydipsia, polyuria, or low sugar symptoms. Pt states overall good compliance with treatment and medications, good tolerability, and has been trying to follow appropriate diet.  Pt denies worsening depressive symptoms, suicidal ideation or panic. No fever, night sweats, wt loss, loss of appetite, or other constitutional symptoms.  Pt states good ability with ADL's, has low fall risk, home safety reviewed and adequate, no other significant changes in hearing or vision, and only occasionally active with exercise. More stress recently with mother dementia.   Has lost wt with less calories despite the knee pain, sees murphy wainer, also taking what sounds like volt gel. Does have bilat leg pain below the knees debilitating with more walking, not sure if all knee pain related Wt Readings from Last 3 Encounters:  05/01/18 288 lb (130.6 kg)  05/23/17 (!) 306 lb (138.8 kg)  04/26/17 (!) 310 lb (140.6 kg)   Past Medical History:  Diagnosis Date  . ALLERGIC RHINITIS 10/14/2008  . ANXIETY 10/14/2008  . ASTHMA 10/14/2008  . DEGENERATIVE JOINT DISEASE 10/14/2008  . GERD 10/14/2008  . HYPERLIPIDEMIA 10/14/2008  . INTERMITTENT VERTIGO 11/03/2008  . NEPHROLITHIASIS, HX OF 10/14/2008   Past Surgical History:  Procedure Laterality Date  . APPENDECTOMY    . CESAREAN SECTION    . s/p left knee surgery    . s/p left ulnar nerve surgery     x's 2  . s/p nasal surgery     x's 2  . s/p right knee surgery     x's 4- Cartilage torn  . TONSILLECTOMY      reports that she has never smoked. She has never used smokeless tobacco. She reports that she does  not drink alcohol. Her drug history is not on file. family history includes Cancer in her maternal grandfather; Diabetes in her maternal grandfather. Allergies  Allergen Reactions  . Codeine     itching  . Tramadol Rash   Current Outpatient Medications on File Prior to Visit  Medication Sig Dispense Refill  . acetaminophen (TYLENOL 8 HOUR) 650 MG CR tablet Take 650 mg by mouth every 8 (eight) hours as needed for pain.    . nortriptyline (PAMELOR) 10 MG capsule Take 10 mg by mouth at bedtime.     No current facility-administered medications on file prior to visit.    Review of Systems Constitutional: Negative for other unusual diaphoresis, sweats, appetite or weight changes HENT: Negative for other worsening hearing loss, ear pain, facial swelling, mouth sores or neck stiffness.   Eyes: Negative for other worsening pain, redness or other visual disturbance.  Respiratory: Negative for other stridor or swelling Cardiovascular: Negative for other palpitations or other chest pain  Gastrointestinal: Negative for worsening diarrhea or loose stools, blood in stool, distention or other pain Genitourinary: Negative for hematuria, flank pain or other change in urine volume.  Musculoskeletal: Negative for myalgias or other joint swelling.  Skin: Negative for other color change, or other wound or worsening drainage.  Neurological: Negative for other syncope or numbness. Hematological: Negative for other adenopathy or swelling Psychiatric/Behavioral: Negative for hallucinations, other worsening agitation, SI, self-injury,  or new decreased concentration All other system neg per pt    Objective:   Physical Exam BP 128/86   Pulse (!) 115   Temp 98 F (36.7 C) (Oral)   Ht 5\' 9"  (1.753 m)   Wt 288 lb (130.6 kg)   LMP 06/10/2015   SpO2 96%   BMI 42.53 kg/m  VS noted,  Constitutional: Pt is oriented to person, place, and time. Appears well-developed and well-nourished, in no significant  distress and comfortable Head: Normocephalic and atraumatic  Eyes: Conjunctivae and EOM are normal. Pupils are equal, round, and reactive to light Right Ear: External ear normal without discharge Left Ear: External ear normal without discharge Nose: Nose without discharge or deformity Mouth/Throat: Oropharynx is without other ulcerations and moist  Neck: Normal range of motion. Neck supple. No JVD present. No tracheal deviation present or significant neck LA or mass Cardiovascular: Normal rate, regular rhythm, normal heart sounds and intact distal pulses.   Pulmonary/Chest: WOB normal and breath sounds without rales or wheezing  Abdominal: Soft. Bowel sounds are normal. NT. No HSM  Musculoskeletal: Normal range of motion. Exhibits no edema Lymphadenopathy: Has no other cervical adenopathy.  Neurological: Pt is alert and oriented to person, place, and time. Pt has normal reflexes. No cranial nerve deficit. Motor grossly intact, Gait intact Skin: Skin is warm and dry. No rash noted or new ulcerations Psychiatric:  Has normal mood and affect. Behavior is normal without agitation No other exam findings    Assessment & Plan:

## 2018-05-01 NOTE — Assessment & Plan Note (Signed)
stable overall by history and exam, recent data reviewed with pt, and pt to continue medical treatment as before,  to f/u any worsening symptoms or concerns BP Readings from Last 3 Encounters:  05/01/18 128/86  05/23/17 130/84  04/26/17 130/84

## 2018-05-01 NOTE — Assessment & Plan Note (Signed)
stable overall by history and exam, recent data reviewed with pt, and pt to continue medical treatment as before,  to f/u any worsening symptoms or concerns Lab Results  Component Value Date   HGBA1C 5.3 05/01/2018   

## 2018-05-01 NOTE — Assessment & Plan Note (Signed)
Ok for LE arterial dopplers,  to f/u any worsening symptoms or concerns  

## 2018-05-01 NOTE — Assessment & Plan Note (Signed)

## 2018-05-02 ENCOUNTER — Encounter: Payer: Self-pay | Admitting: Internal Medicine

## 2018-05-14 ENCOUNTER — Ambulatory Visit (HOSPITAL_COMMUNITY)
Admission: RE | Admit: 2018-05-14 | Discharge: 2018-05-14 | Disposition: A | Discharge status: Home / Self Care | Payer: 59 | Source: Ambulatory Visit | Attending: Cardiology | Admitting: Cardiology

## 2018-05-14 DIAGNOSIS — M79605 Pain in left leg: Secondary | ICD-10-CM

## 2018-05-14 DIAGNOSIS — M79604 Pain in right leg: Secondary | ICD-10-CM | POA: Insufficient documentation

## 2018-05-16 ENCOUNTER — Encounter: Payer: Self-pay | Admitting: Internal Medicine

## 2018-07-03 ENCOUNTER — Ambulatory Visit: Payer: 59 | Admitting: Internal Medicine

## 2018-07-03 ENCOUNTER — Encounter: Payer: Self-pay | Admitting: Internal Medicine

## 2018-07-03 VITALS — BP 132/84 | HR 100 | Temp 97.6°F | Ht 69.0 in | Wt 290.6 lb

## 2018-07-03 DIAGNOSIS — N61 Mastitis without abscess: Secondary | ICD-10-CM | POA: Insufficient documentation

## 2018-07-03 DIAGNOSIS — R739 Hyperglycemia, unspecified: Secondary | ICD-10-CM

## 2018-07-03 DIAGNOSIS — I10 Essential (primary) hypertension: Secondary | ICD-10-CM

## 2018-07-03 MED ORDER — DOXYCYCLINE HYCLATE 100 MG PO TABS
100.0000 mg | ORAL_TABLET | Freq: Two times a day (BID) | ORAL | 0 refills | Status: DC
Start: 2018-07-03 — End: 2018-12-17

## 2018-07-03 NOTE — Assessment & Plan Note (Signed)
stable overall by history and exam, recent data reviewed with pt, and pt to continue medical treatment as before,  to f/u any worsening symptoms or concerns BP Readings from Last 3 Encounters:  07/03/18 132/84  05/01/18 128/86  05/23/17 130/84

## 2018-07-03 NOTE — Assessment & Plan Note (Signed)
stable overall by history and exam, recent data reviewed with pt, and pt to continue medical treatment as before,  to f/u any worsening symptoms or concerns Lab Results  Component Value Date   HGBA1C 5.3 05/01/2018

## 2018-07-03 NOTE — Progress Notes (Signed)
Subjective:    Patient ID: [name], female    DOB: 11-15-1960, 57 y.o.   MRN: [id number]  HPI  Here with c/o 3 days onset red, tender, firm spot to mid aspect right lower breast and last night with slight blood, no pus.  No fever or overt drainage. Dies have unusual erythem rash with numerous < 5 mm nontender spots to ruq and right side as well.  No shingles like grouped vesicle.  Pt denies chest pain, increased sob or doe, wheezing, orthopnea, PND, increased LE swelling, palpitations, dizziness or syncope.   Pt denies polydipsia, polyuria.  Has had the shingrix last yr.   Past Medical History:  Diagnosis Date  . ALLERGIC RHINITIS 10/14/2008  . ANXIETY 10/14/2008  . ASTHMA 10/14/2008  . DEGENERATIVE JOINT DISEASE 10/14/2008  . GERD 10/14/2008  . HYPERLIPIDEMIA 10/14/2008  . INTERMITTENT VERTIGO 11/03/2008  . NEPHROLITHIASIS, HX OF 10/14/2008   Past Surgical History:  Procedure Laterality Date  . APPENDECTOMY    . CESAREAN SECTION    . s/p left knee surgery    . s/p left ulnar nerve surgery     x's 2  . s/p nasal surgery     x's 2  . s/p right knee surgery     x's 4- Cartilage torn  . TONSILLECTOMY      reports that she has never smoked. She has never used smokeless tobacco. She reports that she does not drink alcohol. Her drug history is not on file. family history includes Cancer in her maternal grandfather; Diabetes in her maternal grandfather. Allergies  Allergen Reactions  . Codeine     itching  . Tramadol Rash   Current Outpatient Medications on File Prior to Visit  Medication Sig Dispense Refill  . acetaminophen (TYLENOL 8 HOUR) 650 MG CR tablet Take 650 mg by mouth every 8 (eight) hours as needed for pain.    . indomethacin (INDOCIN SR) 75 MG CR capsule TAKE ONE CAPSULE BY MOUTH TWICE A DAY WITH MEALS AS NEEDED 60 capsule 5  . losartan-hydrochlorothiazide (HYZAAR) 50-12.5 MG tablet Take 1 tablet by mouth daily. 90 tablet 3  . nortriptyline (PAMELOR) 10 MG capsule  Take 10 mg by mouth at bedtime.     No current facility-administered medications on file prior to visit.    Review of Systems  Constitutional: Negative for other unusual diaphoresis or sweats HENT: Negative for ear discharge or swelling Eyes: Negative for other worsening visual disturbances Respiratory: Negative for stridor or other swelling  Gastrointestinal: Negative for worsening distension or other blood Genitourinary: Negative for retention or other urinary change Musculoskeletal: Negative for other MSK pain or swelling Skin: Negative for color change or other new lesions Neurological: Negative for worsening tremors and other numbness  Psychiatric/Behavioral: Negative for worsening agitation or other fatigue All other system neg per pt    Objective:   Physical Exam BP 132/84 (BP Location: Left Arm, Patient Position: Sitting, Cuff Size: Large)   Pulse 100   Temp 97.6 F (36.4 C) (Oral)   Ht 5\' 9"  (1.753 m)   Wt 290 lb 9.6 oz (131.8 kg)   LMP 06/10/2015   SpO2 97%   BMI 42.91 kg/m  VS noted, non toxic Constitutional: Pt appears in NAD HENT: Head: NCAT.  Right Ear: External ear normal.  Left Ear: External ear normal.  Eyes: . Pupils are equal, round, and reactive to light. Conjunctivae and EOM are normal Nose: without d/c or deformity Neck: Neck supple. Michaell Cowing  normal ROM Right breast mid lower aspect with 2 cm slightly raised tender indurated erythema with central 3 mm ulceration without overt drainage, no fluctuance Cardiovascular: Normal rate and regular rhythm.   Pulmonary/Chest: Effort normal and breath sounds without rales or wheezing.  Abd:  Soft, NT, ND, + BS, no organomegaly Neurological: Pt is alert. At baseline orientation, motor grossly intact Skin: Skin is warm. + rashes as described in HPI, no other new lesions, no LE edema Psychiatric: Pt behavior is normal without agitation  No other exam findings Lab Results  Component Value Date   WBC 11.1 (H)  05/01/2018   HGB 15.0 05/01/2018   HCT 43.8 05/01/2018   PLT 468.0 (H) 05/01/2018   GLUCOSE 100 (H) 05/01/2018   CHOL 218 (H) 05/01/2018   TRIG 99.0 05/01/2018   HDL 84.00 05/01/2018   LDLDIRECT 109.3 09/06/2010   LDLCALC 114 (H) 05/01/2018   ALT 30 05/01/2018   AST 22 05/01/2018   NA 139 05/01/2018   K 3.9 05/01/2018   CL 104 05/01/2018   CREATININE 0.60 05/01/2018   BUN 13 05/01/2018   CO2 26 05/01/2018   TSH 0.75 05/01/2018   INR 0.90 11/14/2010   HGBA1C 5.3 05/01/2018       Assessment & Plan:

## 2018-07-03 NOTE — Patient Instructions (Signed)
Please take all new medication as prescribed - the antibiotic  Please call or returin in 1 week if not resolved  Please continue all other medications as before, and refills have been done if requested.  Please have the pharmacy call with any other refills you may need.  Please continue your efforts at being more active, low cholesterol diet, and weight control.  Please keep your appointments with your specialists as you may have planned

## 2018-07-03 NOTE — Assessment & Plan Note (Signed)
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

## 2018-07-24 ENCOUNTER — Other Ambulatory Visit: Payer: Self-pay | Admitting: Obstetrics and Gynecology

## 2018-07-24 DIAGNOSIS — Z1231 Encounter for screening mammogram for malignant neoplasm of breast: Secondary | ICD-10-CM

## 2018-08-22 DIAGNOSIS — Z23 Encounter for immunization: Secondary | ICD-10-CM | POA: Diagnosis not present

## 2018-08-25 DIAGNOSIS — Z01419 Encounter for gynecological examination (general) (routine) without abnormal findings: Secondary | ICD-10-CM | POA: Diagnosis not present

## 2018-08-25 DIAGNOSIS — Z6841 Body Mass Index (BMI) 40.0 and over, adult: Secondary | ICD-10-CM | POA: Diagnosis not present

## 2018-09-03 ENCOUNTER — Ambulatory Visit
Admission: RE | Admit: 2018-09-03 | Discharge: 2018-09-03 | Disposition: A | Discharge status: Home / Self Care | Payer: 59 | Source: Ambulatory Visit | Attending: Obstetrics and Gynecology | Admitting: Obstetrics and Gynecology

## 2018-09-03 DIAGNOSIS — Z1231 Encounter for screening mammogram for malignant neoplasm of breast: Secondary | ICD-10-CM | POA: Diagnosis not present

## 2018-09-11 IMAGING — NM NM MISC PROCEDURE
6 series · 36 of 36 positions shown · non-contrast
Comparison: none

[Series 1: rest · 6.40mm/px · 6 of 64 frames shown]
[frame 6/64]
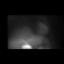
[frame 16/64]
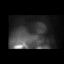
[frame 27/64]
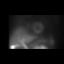
[frame 38/64]
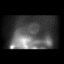
[frame 48/64]
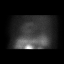
[frame 59/64]
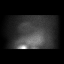

[Series 1: wbr_s-proj_st stress-sum-em · 6.40mm/px · 6 of 64 frames shown]
[frame 6/64]
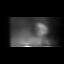
[frame 16/64]
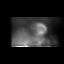
[frame 27/64]
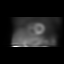
[frame 38/64]
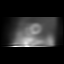
[frame 48/64]
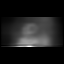
[frame 59/64]
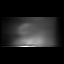

[Series 1: wbr_r-proj_st rest · 6.40mm/px · 6 of 64 frames shown]
[frame 6/64]
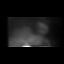
[frame 16/64]
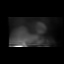
[frame 27/64]
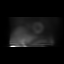
[frame 38/64]
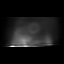
[frame 48/64]
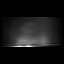
[frame 59/64]
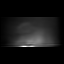

[Series 1: wbr_s-proj_st stress-gsp · 6.40mm/px · 6 of 512 frames shown]
[frame 43/512]
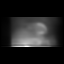
[frame 128/512]
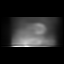
[frame 214/512]
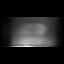
[frame 299/512]
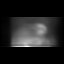
[frame 384/512]
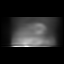
[frame 470/512]
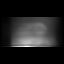

[Series 1: stress-sum-em · 6.40mm/px · 6 of 64 frames shown]
[frame 6/64]
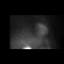
[frame 16/64]
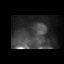
[frame 27/64]
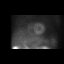
[frame 38/64]
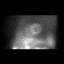
[frame 48/64]
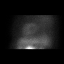
[frame 59/64]
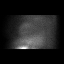

[Series 1: stress-gsp · 6.40mm/px · 6 of 502 frames shown]
[frame 42/502]
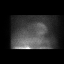
[frame 126/502]
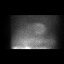
[frame 210/502]
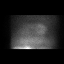
[frame 293/502]
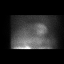
[frame 377/502]
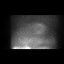
[frame 461/502]
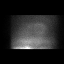

[36 of 36 positions shown; findings below may reference images not displayed]

Canned report from images found in remote index.

Refer to host system for actual result text.

## 2018-11-20 ENCOUNTER — Other Ambulatory Visit: Payer: Self-pay | Admitting: Internal Medicine

## 2018-12-17 ENCOUNTER — Ambulatory Visit: Payer: 59 | Admitting: Internal Medicine

## 2018-12-17 ENCOUNTER — Encounter: Payer: Self-pay | Admitting: Internal Medicine

## 2018-12-17 VITALS — BP 118/82 | HR 115 | Temp 99.1°F | Ht 69.0 in | Wt 285.0 lb

## 2018-12-17 DIAGNOSIS — I10 Essential (primary) hypertension: Secondary | ICD-10-CM | POA: Diagnosis not present

## 2018-12-17 DIAGNOSIS — R062 Wheezing: Secondary | ICD-10-CM

## 2018-12-17 DIAGNOSIS — R739 Hyperglycemia, unspecified: Secondary | ICD-10-CM | POA: Diagnosis not present

## 2018-12-17 DIAGNOSIS — J209 Acute bronchitis, unspecified: Secondary | ICD-10-CM | POA: Diagnosis not present

## 2018-12-17 MED ORDER — HYDROCODONE-HOMATROPINE 5-1.5 MG/5ML PO SYRP: 5.0000 mL | ORAL_SOLUTION | Freq: Four times a day (QID) | ORAL | 0 refills | Status: AC | PRN

## 2018-12-17 MED ORDER — PREDNISONE 10 MG PO TABS: ORAL_TABLET | ORAL | 0 refills | Status: DC

## 2018-12-17 MED ORDER — AZITHROMYCIN 250 MG PO TABS: ORAL_TABLET | ORAL | 1 refills | Status: DC

## 2018-12-17 NOTE — Assessment & Plan Note (Signed)
Mild to mod, for antibx course,  to f/u any worsening symptoms or concerns 

## 2018-12-17 NOTE — Patient Instructions (Signed)
Please take all new medication as prescribed - the antibiotic, cough medicine, and prednisone  Please continue all other medications as before, and refills have been done if requested.  Please have the pharmacy call with any other refills you may need.  Please continue your efforts at being more active, low cholesterol diet, and weight control.  Please keep your appointments with your specialists as you may have planned     

## 2018-12-17 NOTE — Assessment & Plan Note (Signed)
stable overall by history and exam, recent data reviewed with pt, and pt to continue medical treatment as before,  to f/u any worsening symptoms or concerns  

## 2018-12-17 NOTE — Progress Notes (Signed)
Subjective:    Patient ID: [name], female    DOB: [birth year], 58 y.o.   MRN: [id number]  HPI  Here with acute onset mild to mod 2-3 days ST, HA, general weakness and malaise, with prod cough greenish sputum, but Pt denies chest pain, increased sob or doe, wheezing, orthopnea, PND, increased LE swelling, palpitations, dizziness or syncope, except for onset mild wheezing and sob last PM.  Pt denies new neurological symptoms such as new headache, or facial or extremity weakness or numbness   Pt denies polydipsia, polyuria  Denies worsening depressive symptoms, suicidal ideation, or panic Past Medical History:  Diagnosis Date  . ALLERGIC RHINITIS 10/14/2008  . ANXIETY 10/14/2008  . ASTHMA 10/14/2008  . DEGENERATIVE JOINT DISEASE 10/14/2008  . GERD 10/14/2008  . HYPERLIPIDEMIA 10/14/2008  . INTERMITTENT VERTIGO 11/03/2008  . NEPHROLITHIASIS, HX OF 10/14/2008   Past Surgical History:  Procedure Laterality Date  . APPENDECTOMY    . CESAREAN SECTION    . s/p left knee surgery    . s/p left ulnar nerve surgery     x's 2  . s/p nasal surgery     x's 2  . s/p right knee surgery     x's 4- Cartilage torn  . TONSILLECTOMY      reports that she has never smoked. She has never used smokeless tobacco. She reports that she does not drink alcohol. No history on file for drug. family history includes Cancer in her maternal grandfather; Diabetes in her maternal grandfather. Allergies  Allergen Reactions  . Codeine     itching  . Tramadol Rash   Current Outpatient Medications on File Prior to Visit  Medication Sig Dispense Refill  . acetaminophen (TYLENOL 8 HOUR) 650 MG CR tablet Take 650 mg by mouth every 8 (eight) hours as needed for pain.    . indomethacin (INDOCIN SR) 75 MG CR capsule TAKE ONE CAPSULE BY MOUTH TWICE A DAY WITH MEALS AS NEEDED 180 capsule 1  . losartan-hydrochlorothiazide (HYZAAR) 50-12.5 MG tablet Take 1 tablet by mouth daily. 90 tablet 3  . nortriptyline (PAMELOR) 10 MG  capsule Take 10 mg by mouth at bedtime.     No current facility-administered medications on file prior to visit.    Review of Systems  Constitutional: Negative for other unusual diaphoresis or sweats HENT: Negative for ear discharge or swelling Eyes: Negative for other worsening visual disturbances Respiratory: Negative for stridor or other swelling  Gastrointestinal: Negative for worsening distension or other blood Genitourinary: Negative for retention or other urinary change Musculoskeletal: Negative for other MSK pain or swelling Skin: Negative for color change or other new lesions Neurological: Negative for worsening tremors and other numbness  Psychiatric/Behavioral: Negative for worsening agitation or other fatigue All other system neg per pt    Objective:   Physical Exam BP 118/82   Pulse (!) 115   Temp 99.1 F (37.3 C) (Oral)   Ht 5\' 9"  (1.753 m)   Wt 285 lb (129.3 kg)   LMP 06/10/2015   SpO2 95%   BMI 42.09 kg/m  VS noted, mild ill Constitutional: Pt appears in NAD HENT: Head: NCAT.  Right Ear: External ear normal.  Left Ear: External ear normal.  Bilat tm's with mild erythema.  Max sinus areas mild tender.  Pharynx with mild erythema, no exudate Eyes: . Pupils are equal, round, and reactive to light. Conjunctivae and EOM are normal Nose: without d/c or deformity Neck: Neck supple. Gross normal ROM Cardiovascular:  Normal rate and regular rhythm.   Pulmonary/Chest: Effort normal and breath sounds without rales but with few bilat wheezing.  Neurological: Pt is alert. At baseline orientation, motor grossly intact Skin: Skin is warm. No rashes, other new lesions, no LE edema Psychiatric: Pt behavior is normal without agitation  No other exam findings Lab Results  Component Value Date   WBC 11.1 (H) 05/01/2018   HGB 15.0 05/01/2018   HCT 43.8 05/01/2018   PLT 468.0 (H) 05/01/2018   GLUCOSE 100 (H) 05/01/2018   CHOL 218 (H) 05/01/2018   TRIG 99.0 05/01/2018    HDL 84.00 05/01/2018   LDLDIRECT 109.3 09/06/2010   LDLCALC 114 (H) 05/01/2018   ALT 30 05/01/2018   AST 22 05/01/2018   NA 139 05/01/2018   K 3.9 05/01/2018   CL 104 05/01/2018   CREATININE 0.60 05/01/2018   BUN 13 05/01/2018   CO2 26 05/01/2018   TSH 0.75 05/01/2018   INR 0.90 11/14/2010   HGBA1C 5.3 05/01/2018       Assessment & Plan:

## 2018-12-17 NOTE — Assessment & Plan Note (Signed)
/  Mild to mod, for predpac asd,  to f/u any worsening symptoms or concerns 

## 2019-01-07 DIAGNOSIS — G43109 Migraine with aura, not intractable, without status migrainosus: Secondary | ICD-10-CM | POA: Diagnosis not present

## 2019-01-19 DIAGNOSIS — M1711 Unilateral primary osteoarthritis, right knee: Secondary | ICD-10-CM | POA: Diagnosis not present

## 2019-04-19 ENCOUNTER — Other Ambulatory Visit: Payer: Self-pay | Admitting: Internal Medicine

## 2019-05-20 ENCOUNTER — Other Ambulatory Visit: Payer: Self-pay | Admitting: Internal Medicine

## 2019-07-27 ENCOUNTER — Other Ambulatory Visit: Payer: Self-pay | Admitting: Obstetrics and Gynecology

## 2019-07-27 DIAGNOSIS — Z1231 Encounter for screening mammogram for malignant neoplasm of breast: Secondary | ICD-10-CM

## 2019-08-27 ENCOUNTER — Encounter: Payer: Self-pay | Admitting: Internal Medicine

## 2019-08-27 ENCOUNTER — Ambulatory Visit (INDEPENDENT_AMBULATORY_CARE_PROVIDER_SITE_OTHER): Payer: 59 | Admitting: Internal Medicine

## 2019-08-27 ENCOUNTER — Other Ambulatory Visit: Payer: Self-pay

## 2019-08-27 ENCOUNTER — Other Ambulatory Visit: Payer: Self-pay | Admitting: Internal Medicine

## 2019-08-27 ENCOUNTER — Other Ambulatory Visit (INDEPENDENT_AMBULATORY_CARE_PROVIDER_SITE_OTHER): Payer: 59

## 2019-08-27 VITALS — BP 126/82 | HR 102 | Temp 97.9°F | Ht 69.0 in | Wt 286.0 lb

## 2019-08-27 DIAGNOSIS — E559 Vitamin D deficiency, unspecified: Secondary | ICD-10-CM

## 2019-08-27 DIAGNOSIS — E538 Deficiency of other specified B group vitamins: Secondary | ICD-10-CM

## 2019-08-27 DIAGNOSIS — I10 Essential (primary) hypertension: Secondary | ICD-10-CM | POA: Diagnosis not present

## 2019-08-27 DIAGNOSIS — S4382XA Sprain of other specified parts of left shoulder girdle, initial encounter: Secondary | ICD-10-CM

## 2019-08-27 DIAGNOSIS — Z0000 Encounter for general adult medical examination without abnormal findings: Secondary | ICD-10-CM

## 2019-08-27 DIAGNOSIS — R739 Hyperglycemia, unspecified: Secondary | ICD-10-CM

## 2019-08-27 DIAGNOSIS — E611 Iron deficiency: Secondary | ICD-10-CM

## 2019-08-27 DIAGNOSIS — Z0001 Encounter for general adult medical examination with abnormal findings: Secondary | ICD-10-CM | POA: Diagnosis not present

## 2019-08-27 DIAGNOSIS — E785 Hyperlipidemia, unspecified: Secondary | ICD-10-CM

## 2019-08-27 DIAGNOSIS — F411 Generalized anxiety disorder: Secondary | ICD-10-CM

## 2019-08-27 LAB — CBC WITH DIFFERENTIAL/PLATELET
Basophils Absolute: 0.1 10*3/uL (ref 0.0–0.1)
Basophils Relative: 0.6 % (ref 0.0–3.0)
Eosinophils Absolute: 0.2 10*3/uL (ref 0.0–0.7)
Eosinophils Relative: 2.3 % (ref 0.0–5.0)
HCT: 44.5 % (ref 36.0–46.0)
Hemoglobin: 14.9 g/dL (ref 12.0–15.0)
Lymphocytes Relative: 43.9 % (ref 12.0–46.0)
Lymphs Abs: 3.7 10*3/uL (ref 0.7–4.0)
MCHC: 33.5 g/dL (ref 30.0–36.0)
MCV: 89.4 fl (ref 78.0–100.0)
Monocytes Absolute: 0.7 10*3/uL (ref 0.1–1.0)
Monocytes Relative: 7.8 % (ref 3.0–12.0)
Neutro Abs: 3.9 10*3/uL (ref 1.4–7.7)
Neutrophils Relative %: 45.4 % (ref 43.0–77.0)
Platelets: 517 10*3/uL — ABNORMAL HIGH (ref 150.0–400.0)
RBC: 4.97 Mil/uL (ref 3.87–5.11)
RDW: 12.9 % (ref 11.5–15.5)
WBC: 8.5 10*3/uL (ref 4.0–10.5)

## 2019-08-27 LAB — LIPID PANEL
Cholesterol: 257 mg/dL — ABNORMAL HIGH (ref 0–200)
HDL: 70.9 mg/dL (ref >39.00)
LDL Cholesterol: 151 mg/dL — ABNORMAL HIGH (ref 0–99)
NonHDL: 186.5
Total CHOL/HDL Ratio: 4
Triglycerides: 180 mg/dL — ABNORMAL HIGH (ref 0.0–149.0)
VLDL: 36 mg/dL (ref 0.0–40.0)

## 2019-08-27 LAB — URINALYSIS, ROUTINE W REFLEX MICROSCOPIC
Ketones, ur: NEGATIVE
Leukocytes,Ua: NEGATIVE
Nitrite: NEGATIVE
Specific Gravity, Urine: >=1.03 — AB (ref 1.000–1.030)
Urine Glucose: NEGATIVE
Urobilinogen, UA: 0.2 (ref 0.0–1.0)
pH: 5 (ref 5.0–8.0)

## 2019-08-27 LAB — TSH: TSH: 0.64 u[IU]/mL (ref 0.35–4.50)

## 2019-08-27 LAB — BASIC METABOLIC PANEL
BUN: 21 mg/dL (ref 6–23)
CO2: 23 mEq/L (ref 19–32)
Calcium: 10 mg/dL (ref 8.4–10.5)
Chloride: 103 mEq/L (ref 96–112)
Creatinine, Ser: 0.75 mg/dL (ref 0.40–1.20)
GFR: 79.72 mL/min (ref >60.00)
Glucose, Bld: 126 mg/dL — ABNORMAL HIGH (ref 70–99)
Potassium: 3.9 mEq/L (ref 3.5–5.1)
Sodium: 136 mEq/L (ref 135–145)

## 2019-08-27 LAB — IBC PANEL
Iron: 115 ug/dL (ref 42–145)
Saturation Ratios: 31.6 % (ref 20.0–50.0)
Transferrin: 260 mg/dL (ref 212.0–360.0)

## 2019-08-27 LAB — HEMOGLOBIN A1C: Hgb A1c MFr Bld: 5.5 % (ref 4.6–6.5)

## 2019-08-27 LAB — VITAMIN D 25 HYDROXY (VIT D DEFICIENCY, FRACTURES): VITD: 16.09 ng/mL — ABNORMAL LOW (ref 30.00–100.00)

## 2019-08-27 LAB — HEPATIC FUNCTION PANEL
ALT: 28 U/L (ref 0–35)
AST: 19 U/L (ref 0–37)
Albumin: 4.5 g/dL (ref 3.5–5.2)
Alkaline Phosphatase: 70 U/L (ref 39–117)
Bilirubin, Direct: 0.1 mg/dL (ref 0.0–0.3)
Total Bilirubin: 0.7 mg/dL (ref 0.2–1.2)
Total Protein: 7.2 g/dL (ref 6.0–8.3)

## 2019-08-27 LAB — VITAMIN B12: Vitamin B-12: 362 pg/mL (ref 211–911)

## 2019-08-27 MED ORDER — NORTRIPTYLINE HCL 10 MG PO CAPS: 10.0000 mg | ORAL_CAPSULE | Freq: Every day | ORAL | 3 refills | Status: AC

## 2019-08-27 MED ORDER — VITAMIN D (ERGOCALCIFEROL) 1.25 MG (50000 UNIT) PO CAPS: 50000.0000 [IU] | ORAL_CAPSULE | ORAL | 0 refills | Status: DC

## 2019-08-27 MED ORDER — LOSARTAN POTASSIUM-HCTZ 50-12.5 MG PO TABS: 1.0000 | ORAL_TABLET | Freq: Every day | ORAL | 3 refills | Status: DC

## 2019-08-27 MED ORDER — INDOMETHACIN ER 75 MG PO CPCR: ORAL_CAPSULE | ORAL | 1 refills | Status: DC

## 2019-08-27 NOTE — Progress Notes (Signed)
Subjective:    Patient ID: [name], female    DOB: 12/26/1960, 58 y.o.   MRN: [id number]  HPI  Here for wellness and f/u;  Overall doing ok;  Pt denies Chest pain, worsening SOB, DOE, wheezing, orthopnea, PND, worsening LE edema, palpitations, dizziness or syncope.  Pt denies neurological change such as new headache, facial or extremity weakness.  Pt denies polydipsia, polyuria, or low sugar symptoms. Pt states overall good compliance with treatment and medications, good tolerability, and has been trying to follow appropriate diet.  Pt denies worsening depressive symptoms, suicidal ideation or panic. No fever, night sweats, wt loss, loss of appetite, or other constitutional symptoms.  Pt states good ability with ADL's, has low fall risk, home safety reviewed and adequate, no other significant changes in hearing or vision, and only occasionally active with exercise. Wt Readings from Last 3 Encounters:  08/27/19 286 lb (129.7 kg)  12/17/18 285 lb (129.3 kg)  07/03/18 290 lb 9.6 oz (131.8 kg)   BP Readings from Last 3 Encounters:  08/27/19 126/82  12/17/18 118/82  07/03/18 132/84  Also c/o left neck and upper back pain for 3 days, sharp and dull, constant, worse to turn the head to the left and raise the left arm, and has an unusual swelling non discrete to the area extending to the supraclavicular, nothing else seems to make better or worsse Past Medical History:  Diagnosis Date  . ALLERGIC RHINITIS 10/14/2008  . ANXIETY 10/14/2008  . ASTHMA 10/14/2008  . DEGENERATIVE JOINT DISEASE 10/14/2008  . GERD 10/14/2008  . HYPERLIPIDEMIA 10/14/2008  . INTERMITTENT VERTIGO 11/03/2008  . NEPHROLITHIASIS, HX OF 10/14/2008   Past Surgical History:  Procedure Laterality Date  . APPENDECTOMY    . CESAREAN SECTION    . s/p left knee surgery    . s/p left ulnar nerve surgery     x's 2  . s/p nasal surgery     x's 2  . s/p right knee surgery     x's 4- Cartilage torn  . TONSILLECTOMY      reports that she has never smoked. She has never used smokeless tobacco. She reports that she does not drink alcohol. No history on file for drug. family history includes Cancer in her maternal grandfather; Diabetes in her maternal grandfather. Allergies  Allergen Reactions  . Codeine     itching  . Tramadol Rash   Current Outpatient Medications on File Prior to Visit  Medication Sig Dispense Refill  . acetaminophen (TYLENOL 8 HOUR) 650 MG CR tablet Take 650 mg by mouth every 8 (eight) hours as needed for pain.     No current facility-administered medications on file prior to visit.    Review of Systems VS noted,  Constitutional: Pt is oriented to person, place, and time. Appears well-developed and well-nourished, in no significant distress and comfortable Head: Normocephalic and atraumatic  Eyes: Conjunctivae and EOM are normal. Pupils are equal, round, and reactive to light Right Ear: External ear normal without discharge Left Ear: External ear normal without discharge Nose: Nose without discharge or deformity Mouth/Throat: Oropharynx is without other ulcerations and moist  Neck: Normal range of motion. Neck supple. No JVD present. No tracheal deviation present or significant neck LA or mass Cardiovascular: Normal rate, regular rhythm, normal heart sounds and intact distal pulses.   Pulmonary/Chest: WOB normal and breath sounds without rales or wheezing  Abdominal: Soft. Bowel sounds are normal. NT. No HSM  Musculoskeletal: Normal range of motion.  Exhibits no edema Lymphadenopathy: Has no other cervical adenopathy.  Neurological: Pt is alert and oriented to person, place, and time. Pt has normal reflexes. No cranial nerve deficit. Motor grossly intact, Gait intact Skin: Skin is warm and dry. No rash noted or new ulcerations Psychiatric:  Has normal mood and affect. Behavior is normal without agitation All otherwise neg per pt     Objective:   Physical Exam BP 126/82   Pulse  (!) 102   Temp 97.9 F (36.6 C) (Oral)   Ht 5\' 9"  (1.753 m)   Wt 286 lb (129.7 kg)   LMP 06/10/2015   SpO2 97%   BMI 42.23 kg/m  VS noted,  Constitutional: Pt is oriented to person, place, and time. Appears well-developed and well-nourished, in no significant distress and comfortable Head: Normocephalic and atraumatic  Eyes: Conjunctivae and EOM are normal. Pupils are equal, round, and reactive to light Right Ear: External ear normal without discharge Left Ear: External ear normal without discharge Nose: Nose without discharge or deformity Mouth/Throat: Oropharynx is without other ulcerations and moist  Neck: Normal range of motion. Neck supple. No JVD present. No tracheal deviation present or significant neck LA or mass Cardiovascular: Normal rate, regular rhythm, normal heart sounds and intact distal pulses.   Pulmonary/Chest: WOB normal and breath sounds without rales or wheezing  Abdominal: Soft. Bowel sounds are normal. NT. No HSM  Musculoskeletal: Normal range of motion. Exhibits no edema, has large area of tender swelling to the left trapezoid ridge with some extension to the left supraclavicular area without mass or tenderness Lymphadenopathy: Has no other cervical adenopathy.  Neurological: Pt is alert and oriented to person, place, and time. Pt has normal reflexes. No cranial nerve deficit. Motor grossly intact, Gait intact Skin: Skin is warm and dry. No rash noted or new ulcerations Psychiatric:  Has normal mood and affect. Behavior is normal without agitation All otherwise neg per pt  Lab Results  Component Value Date   WBC 11.1 (H) 05/01/2018   HGB 15.0 05/01/2018   HCT 43.8 05/01/2018   PLT 468.0 (H) 05/01/2018   GLUCOSE 100 (H) 05/01/2018   CHOL 218 (H) 05/01/2018   TRIG 99.0 05/01/2018   HDL 84.00 05/01/2018   LDLDIRECT 109.3 09/06/2010   LDLCALC 114 (H) 05/01/2018   ALT 30 05/01/2018   AST 22 05/01/2018   NA 139 05/01/2018   K 3.9 05/01/2018   CL 104  05/01/2018   CREATININE 0.60 05/01/2018   BUN 13 05/01/2018   CO2 26 05/01/2018   TSH 0.75 05/01/2018   INR 0.90 11/14/2010   HGBA1C 5.3 05/01/2018       Assessment & Plan:

## 2019-08-27 NOTE — Patient Instructions (Signed)

## 2019-08-30 ENCOUNTER — Encounter: Payer: Self-pay | Admitting: Internal Medicine

## 2019-08-30 DIAGNOSIS — S4380XA Sprain of other specified parts of unspecified shoulder girdle, initial encounter: Secondary | ICD-10-CM | POA: Insufficient documentation

## 2019-08-30 NOTE — Assessment & Plan Note (Signed)
stable overall by history and exam, recent data reviewed with pt, and pt to continue medical treatment as before,  to f/u any worsening symptoms or concerns  

## 2019-08-30 NOTE — Assessment & Plan Note (Signed)

## 2019-08-30 NOTE — Assessment & Plan Note (Addendum)
C/w msk strain, no known inciting event, for nsaid prn, f/u any persistent or worsening, declines ct neck/chest  In addition to the time spent performing CPE, I spent an additional 20 minutes face to face,in which greater than 50% of this time was spent in counseling and coordination of care for patient's illness as documented, including the differential dx, treatment, further evaluation and other management of trapezoid strain, hyperglycmia, HTN, HLD, anxiety

## 2019-09-02 DIAGNOSIS — N200 Calculus of kidney: Secondary | ICD-10-CM | POA: Insufficient documentation

## 2019-09-02 HISTORY — DX: Calculus of kidney: N20.0

## 2019-09-10 ENCOUNTER — Other Ambulatory Visit: Payer: Self-pay

## 2019-09-10 ENCOUNTER — Ambulatory Visit
Admission: RE | Admit: 2019-09-10 | Discharge: 2019-09-10 | Disposition: A | Discharge status: Home / Self Care | Payer: 59 | Source: Ambulatory Visit | Attending: Obstetrics and Gynecology | Admitting: Obstetrics and Gynecology

## 2019-09-10 DIAGNOSIS — Z1231 Encounter for screening mammogram for malignant neoplasm of breast: Secondary | ICD-10-CM

## 2019-09-17 ENCOUNTER — Other Ambulatory Visit: Payer: Self-pay

## 2019-09-17 DIAGNOSIS — Z20822 Contact with and (suspected) exposure to covid-19: Secondary | ICD-10-CM

## 2019-09-19 LAB — NOVEL CORONAVIRUS, NAA: SARS-CoV-2, NAA: NOT DETECTED

## 2019-09-25 ENCOUNTER — Telehealth: Payer: Self-pay | Admitting: Hematology

## 2019-09-25 NOTE — Telephone Encounter (Signed)
Pt is aware covid 19 test is ng. Pt had test on 09/17/2019

## 2019-11-17 ENCOUNTER — Other Ambulatory Visit: Payer: Self-pay | Admitting: Internal Medicine

## 2020-03-10 ENCOUNTER — Other Ambulatory Visit: Payer: Self-pay

## 2020-03-10 ENCOUNTER — Ambulatory Visit: Payer: 59 | Admitting: Internal Medicine

## 2020-03-10 ENCOUNTER — Encounter: Payer: Self-pay | Admitting: Internal Medicine

## 2020-03-10 VITALS — BP 140/96 | Temp 99.0°F | Ht 69.0 in | Wt 286.0 lb

## 2020-03-10 DIAGNOSIS — J309 Allergic rhinitis, unspecified: Secondary | ICD-10-CM | POA: Diagnosis not present

## 2020-03-10 DIAGNOSIS — J019 Acute sinusitis, unspecified: Secondary | ICD-10-CM | POA: Diagnosis not present

## 2020-03-10 DIAGNOSIS — J4520 Mild intermittent asthma, uncomplicated: Secondary | ICD-10-CM

## 2020-03-10 DIAGNOSIS — I10 Essential (primary) hypertension: Secondary | ICD-10-CM

## 2020-03-10 DIAGNOSIS — R739 Hyperglycemia, unspecified: Secondary | ICD-10-CM

## 2020-03-10 MED ORDER — LEVOFLOXACIN 500 MG PO TABS: 500.0000 mg | ORAL_TABLET | Freq: Every day | ORAL | 0 refills | Status: AC

## 2020-03-10 NOTE — Patient Instructions (Signed)
Please take all new medication as prescribed - the antibiotic  You can also take Delsym OTC for cough, and/or Mucinex (or it's generic off brand) for congestion, and tylenol as needed for pain.  Please continue all other medications as before, and refills have been done if requested.  Please have the pharmacy call with any other refills you may need.  Please continue your efforts at being more active, low cholesterol diet, and weight control.  Please keep your appointments with your specialists as you may have planned     

## 2020-03-10 NOTE — Progress Notes (Signed)
Subjective:    Patient ID: [name], female    DOB: 1961-07-22, 59 y.o.   MRN: [id number]  HPI   Here with 2-3 days acute onset fever, right facial and nasal pain and swelilng, pressure, headache, general weakness and malaise, and greenish d/c, with mild ST and cough, but pt denies chest pain, wheezing, increased sob or doe, orthopnea, PND, increased LE swelling, palpitations, dizziness or syncope. Does have several wks ongoing nasal allergy symptoms with clearish congestion, itch and sneezing, without fever, pain, ST, cough, swelling or wheezing.   Pt denies polydipsia, polyuria, Past Medical History:  Diagnosis Date  . ALLERGIC RHINITIS 10/14/2008  . ANXIETY 10/14/2008  . ASTHMA 10/14/2008  . DEGENERATIVE JOINT DISEASE 10/14/2008  . GERD 10/14/2008  . HYPERLIPIDEMIA 10/14/2008  . INTERMITTENT VERTIGO 11/03/2008  . NEPHROLITHIASIS, HX OF 10/14/2008   Past Surgical History:  Procedure Laterality Date  . APPENDECTOMY    . CESAREAN SECTION    . s/p left knee surgery    . s/p left ulnar nerve surgery     x's 2  . s/p nasal surgery     x's 2  . s/p right knee surgery     x's 4- Cartilage torn  . TONSILLECTOMY      reports that she has never smoked. She has never used smokeless tobacco. She reports that she does not drink alcohol. No history on file for drug. family history includes Cancer in her maternal grandfather; Diabetes in her maternal grandfather. Allergies  Allergen Reactions  . Codeine     itching  . Tramadol Rash   Current Outpatient Medications on File Prior to Visit  Medication Sig Dispense Refill  . acetaminophen (TYLENOL 8 HOUR) 650 MG CR tablet Take 650 mg by mouth every 8 (eight) hours as needed for pain.    . indomethacin (INDOCIN SR) 75 MG CR capsule TAKE ONE CAPSULE BY MOUTH TWICE A DAY WITH MEALS AS NEEDED 180 capsule 1  . influenza vaccine (FLUCELVAX QUADRIVALENT) 0.5 ML injection Flucelvax Quad 2020-2021 (PF) 60 mcg (15 mcg x 4)/0.5 mL IM syringe  PHARMACY  ADMINISTERED    . losartan-hydrochlorothiazide (HYZAAR) 50-12.5 MG tablet Take 1 tablet by mouth daily. 90 tablet 3  . nortriptyline (PAMELOR) 10 MG capsule Take 1 capsule (10 mg total) by mouth at bedtime. Take 10 mg by mouth at bedtime. 90 capsule 3   No current facility-administered medications on file prior to visit.   Review of Systems All otherwise neg per pt     Objective:   Physical Exam BP (!) 140/96 (BP Location: Left Arm, Patient Position: Sitting, Cuff Size: Large)   Temp 99 F (37.2 C) (Oral)   Ht 5\' 9"  (1.753 m)   Wt 286 lb (129.7 kg)   LMP 06/10/2015   BMI 42.23 kg/m  VS noted,  Constitutional: Pt appears in NAD HENT: Head: NCAT.  Right Ear: External ear normal.  Left Ear: External ear normal.  Eyes: . Pupils are equal, round, and reactive to light. Conjunctivae and EOM are normal Nose: without d/c or deformity Neck: Neck supple. Gross normal ROM Cardiovascular: Normal rate and regular rhythm.   Pulmonary/Chest: Effort normal and breath sounds without rales or wheezing.  Abd:  Soft, NT, ND, + BS, no organomegaly Neurological: Pt is alert. At baseline orientation, motor grossly intact Skin: Skin is warm. No rashes, other new lesions, no LE edema Psychiatric: Pt behavior is normal without agitation  All otherwise neg per pt Lab Results  Component Value Date   WBC 8.5 08/27/2019   HGB 14.9 08/27/2019   HCT 44.5 08/27/2019   PLT 517.0 (H) 08/27/2019   GLUCOSE 126 (H) 08/27/2019   CHOL 257 (H) 08/27/2019   TRIG 180.0 (H) 08/27/2019   HDL 70.90 08/27/2019   LDLDIRECT 109.3 09/06/2010   LDLCALC 151 (H) 08/27/2019   ALT 28 08/27/2019   AST 19 08/27/2019   NA 136 08/27/2019   K 3.9 08/27/2019   CL 103 08/27/2019   CREATININE 0.75 08/27/2019   BUN 21 08/27/2019   CO2 23 08/27/2019   TSH 0.64 08/27/2019   INR 0.90 11/14/2010   HGBA1C 5.5 08/27/2019      Assessment & Plan:

## 2020-03-12 ENCOUNTER — Encounter: Payer: Self-pay | Admitting: Internal Medicine

## 2020-03-12 NOTE — Assessment & Plan Note (Signed)
stable overall by history and exam, recent data reviewed with pt, and pt to continue medical treatment as before,  to f/u any worsening symptoms or concerns  

## 2020-03-12 NOTE — Assessment & Plan Note (Addendum)
Mild to mod, for antibx course,  to f/u any worsening symptoms or concerns  I spent 31 minutes in preparing to see the patient by review of recent labs, imaging and procedures, obtaining and reviewing separately obtained history, communicating with the patient and family or caregiver, ordering medications, tests or procedures, and documenting clinical information in the EHR including the differential Dx, treatment, and any further evaluation and other management of acute sinus infection, allergies, asthma, hyperglycemia, HTN

## 2020-03-12 NOTE — Assessment & Plan Note (Signed)
Mild to mod, for antihist and nasal steroid restart,  to f/u any worsening symptoms or concerns

## 2020-04-23 ENCOUNTER — Other Ambulatory Visit: Payer: Self-pay | Admitting: Internal Medicine

## 2020-08-10 ENCOUNTER — Other Ambulatory Visit: Payer: Self-pay | Admitting: Obstetrics and Gynecology

## 2020-08-10 DIAGNOSIS — Z0000 Encounter for general adult medical examination without abnormal findings: Secondary | ICD-10-CM

## 2020-09-12 ENCOUNTER — Other Ambulatory Visit: Payer: Self-pay

## 2020-09-12 ENCOUNTER — Ambulatory Visit
Admission: RE | Admit: 2020-09-12 | Discharge: 2020-09-12 | Disposition: A | Discharge status: Home / Self Care | Payer: 59 | Source: Ambulatory Visit | Attending: Obstetrics and Gynecology | Admitting: Obstetrics and Gynecology

## 2020-09-12 DIAGNOSIS — Z0000 Encounter for general adult medical examination without abnormal findings: Secondary | ICD-10-CM

## 2020-10-13 ENCOUNTER — Other Ambulatory Visit: Payer: Self-pay | Admitting: Internal Medicine

## 2020-10-22 ENCOUNTER — Other Ambulatory Visit: Payer: Self-pay | Admitting: Internal Medicine

## 2020-10-22 NOTE — Telephone Encounter (Signed)
Please refill as per office routine med refill policy (all routine meds refilled for 3 mo or monthly per pt preference up to one year from last visit, then month to month grace period for 3 mo, then further med refills will have to be denied)  

## 2021-03-12 ENCOUNTER — Other Ambulatory Visit: Payer: Self-pay | Admitting: Internal Medicine

## 2021-03-12 NOTE — Telephone Encounter (Signed)
Ok to contact pt  1 mo only indocin done erx  Please to make rov for further refills

## 2021-03-22 ENCOUNTER — Ambulatory Visit: Payer: 59 | Admitting: Internal Medicine

## 2021-03-22 ENCOUNTER — Other Ambulatory Visit: Payer: Self-pay

## 2021-03-22 ENCOUNTER — Encounter: Payer: Self-pay | Admitting: Internal Medicine

## 2021-03-22 VITALS — BP 120/70 | HR 103 | Temp 99.2°F | Ht 68.5 in | Wt 296.0 lb

## 2021-03-22 DIAGNOSIS — L089 Local infection of the skin and subcutaneous tissue, unspecified: Secondary | ICD-10-CM | POA: Diagnosis not present

## 2021-03-22 DIAGNOSIS — W57XXXA Bitten or stung by nonvenomous insect and other nonvenomous arthropods, initial encounter: Secondary | ICD-10-CM

## 2021-03-22 DIAGNOSIS — I10 Essential (primary) hypertension: Secondary | ICD-10-CM

## 2021-03-22 DIAGNOSIS — R739 Hyperglycemia, unspecified: Secondary | ICD-10-CM | POA: Diagnosis not present

## 2021-03-22 DIAGNOSIS — S30861A Insect bite (nonvenomous) of abdominal wall, initial encounter: Secondary | ICD-10-CM

## 2021-03-22 MED ORDER — DOXYCYCLINE HYCLATE 100 MG PO TABS: 100.0000 mg | ORAL_TABLET | Freq: Two times a day (BID) | ORAL | 0 refills | Status: DC

## 2021-03-22 NOTE — Progress Notes (Signed)
Patient ID: [name], female   DOB: Nov 15, 1960, 60 y.o.   MRN: [id number]        Chief Complaint: LLQ tick bite worsening       HPI:  [name] is a 60 y.o. female here with c/o 3 days onset LLQ tick bite noticed, but yesterday now with sudden worsening red, tender, swelling but no fever, chills or drainage.  Pt denies chest pain, increased sob or doe, wheezing, orthopnea, PND, increased LE swelling, palpitations, dizziness or syncope.   Pt denies polydipsia, polyuria, or new focal neuro s/s.   Pt denies fever, wt loss, night sweats, loss of appetite, or other constitutional symptoms       Wt Readings from Last 3 Encounters:  03/22/21 296 lb (134.3 kg)  03/10/20 286 lb (129.7 kg)  08/27/19 286 lb (129.7 kg)   BP Readings from Last 3 Encounters:  03/22/21 120/70  03/10/20 (!) 140/96  08/27/19 126/82         Past Medical History:  Diagnosis Date  . ALLERGIC RHINITIS 10/14/2008  . ANXIETY 10/14/2008  . ASTHMA 10/14/2008  . DEGENERATIVE JOINT DISEASE 10/14/2008  . GERD 10/14/2008  . HYPERLIPIDEMIA 10/14/2008  . INTERMITTENT VERTIGO 11/03/2008  . NEPHROLITHIASIS, HX OF 10/14/2008   Past Surgical History:  Procedure Laterality Date  . APPENDECTOMY    . CESAREAN SECTION    . s/p left knee surgery    . s/p left ulnar nerve surgery     x's 2  . s/p nasal surgery     x's 2  . s/p right knee surgery     x's 4- Cartilage torn  . TONSILLECTOMY      reports that she has never smoked. She has never used smokeless tobacco. She reports that she does not drink alcohol. No history on file for drug use. family history includes Cancer in her maternal grandfather; Diabetes in her maternal grandfather. Allergies  Allergen Reactions  . Codeine     itching  . Tramadol Rash   Current Outpatient Medications on File Prior to Visit  Medication Sig Dispense Refill  . acetaminophen (TYLENOL) 650 MG CR tablet Take 650 mg by mouth every 8 (eight) hours as needed for pain.    . indomethacin  (INDOCIN SR) 75 MG CR capsule TAKE ONE CAPSULE BY MOUTH TWICE A DAY WITH MEALS AS NEEDED 60 capsule 0  . losartan-hydrochlorothiazide (HYZAAR) 50-12.5 MG tablet TAKE 1 TABLET BY MOUTH EVERY DAY 90 tablet 3  . Multiple Vitamins-Minerals (VITAMIN D3 COMPLETE PO) Take 500 Units by mouth daily.    . nortriptyline (PAMELOR) 10 MG capsule Take 1 capsule (10 mg total) by mouth at bedtime. Take 10 mg by mouth at bedtime. 90 capsule 3   No current facility-administered medications on file prior to visit.        ROS:  All others reviewed and negative.  Objective        PE:  BP 120/70 (BP Location: Left Arm, Patient Position: Sitting, Cuff Size: Large)   Pulse (!) 103   Temp 99.2 F (37.3 C) (Oral)   Ht 5' 8.5" (1.74 m)   Wt 296 lb (134.3 kg)   LMP 06/10/2015   SpO2 97%   BMI 44.35 kg/m                 Constitutional: Pt appears in NAD               HENT: Head: NCAT.  Right Ear: External ear normal.                 Left Ear: External ear normal.                Eyes: . Pupils are equal, round, and reactive to light. Conjunctivae and EOM are normal               Nose: without d/c or deformity               Neck: Neck supple. Gross normal ROM               Cardiovascular: Normal rate and regular rhythm.                 Pulmonary/Chest: Effort normal and breath sounds without rales or wheezing.                Abd:  Soft, NT, ND, + BS, no organomegaly but with LLQ 2 cm area red, tender, swelling with central bite but no fluctuance or drainage               Neurological: Pt is alert. At baseline orientation, motor grossly intact               Skin:, LE edema - none               Psychiatric: Pt behavior is normal without agitation   Micro: none  Cardiac tracings I have personally interpreted today:  none  Pertinent Radiological findings (summarize): none   Lab Results  Component Value Date   WBC 8.5 08/27/2019   HGB 14.9 08/27/2019   HCT 44.5 08/27/2019   PLT 517.0 (H)  08/27/2019   GLUCOSE 126 (H) 08/27/2019   CHOL 257 (H) 08/27/2019   TRIG 180.0 (H) 08/27/2019   HDL 70.90 08/27/2019   LDLDIRECT 109.3 09/06/2010   LDLCALC 151 (H) 08/27/2019   ALT 28 08/27/2019   AST 19 08/27/2019   NA 136 08/27/2019   K 3.9 08/27/2019   CL 103 08/27/2019   CREATININE 0.75 08/27/2019   BUN 21 08/27/2019   CO2 23 08/27/2019   TSH 0.64 08/27/2019   INR 0.90 11/14/2010   HGBA1C 5.5 08/27/2019   Assessment/Plan:  [name] is a 60 y.o. White or Caucasian [1] female with  has a past medical history of ALLERGIC RHINITIS (10/14/2008), ANXIETY (10/14/2008), ASTHMA (10/14/2008), DEGENERATIVE JOINT DISEASE (10/14/2008), GERD (10/14/2008), HYPERLIPIDEMIA (10/14/2008), INTERMITTENT VERTIGO (11/03/2008), and NEPHROLITHIASIS, HX OF (10/14/2008).  Infected tick bite of abdominal wall Mild to mod, for antibx course,  to f/u any worsening symptoms or concerns  Hyperglycemia Lab Results  Component Value Date   HGBA1C 5.5 08/27/2019   Stable, pt to continue current medical treatment  - diet   Essential hypertension BP Readings from Last 3 Encounters:  03/22/21 120/70  03/10/20 (!) 140/96  08/27/19 126/82   Stable, pt to continue medical treatment  - hyzaar   Followup: Return if symptoms worsen or fail to improve.  Oliver Barre, MD 03/29/2021 5:50 AM [facility]  Primary Care - [facility]

## 2021-03-22 NOTE — Patient Instructions (Signed)
Please take all new medication as prescribed - the antibiotic  Please continue all other medications as before, and refills have been done if requested.  Please have the pharmacy call with any other refills you may need.  Please keep your appointments with your specialists as you may have planned   

## 2021-03-29 ENCOUNTER — Encounter: Payer: Self-pay | Admitting: Internal Medicine

## 2021-03-29 NOTE — Assessment & Plan Note (Signed)
Lab Results  Component Value Date   HGBA1C 5.5 08/27/2019   Stable, pt to continue current medical treatment  - diet

## 2021-03-29 NOTE — Assessment & Plan Note (Signed)
BP Readings from Last 3 Encounters:  03/22/21 120/70  03/10/20 (!) 140/96  08/27/19 126/82   Stable, pt to continue medical treatment  - hyzaar

## 2021-03-29 NOTE — Assessment & Plan Note (Signed)
Mild to mod, for antibx course,  to f/u any worsening symptoms or concerns 

## 2021-05-06 ENCOUNTER — Other Ambulatory Visit: Payer: Self-pay | Admitting: Internal Medicine

## 2021-08-07 ENCOUNTER — Other Ambulatory Visit: Payer: Self-pay | Admitting: Internal Medicine

## 2021-08-24 ENCOUNTER — Other Ambulatory Visit: Payer: Self-pay | Admitting: Obstetrics and Gynecology

## 2021-08-24 DIAGNOSIS — Z1231 Encounter for screening mammogram for malignant neoplasm of breast: Secondary | ICD-10-CM

## 2021-09-26 ENCOUNTER — Ambulatory Visit
Admission: RE | Admit: 2021-09-26 | Discharge: 2021-09-26 | Disposition: A | Discharge status: Home / Self Care | Payer: 59 | Source: Ambulatory Visit | Attending: Obstetrics and Gynecology | Admitting: Obstetrics and Gynecology

## 2021-09-26 DIAGNOSIS — Z1231 Encounter for screening mammogram for malignant neoplasm of breast: Secondary | ICD-10-CM

## 2021-11-03 ENCOUNTER — Other Ambulatory Visit: Payer: Self-pay | Admitting: Internal Medicine

## 2021-11-03 NOTE — Telephone Encounter (Signed)
Please refill as per office routine med refill policy (all routine meds to be refilled for 3 mo or monthly (per pt preference) up to one year from last visit, then month to month grace period for 3 mo, then further med refills will have to be denied) ? ?

## 2021-11-07 ENCOUNTER — Other Ambulatory Visit: Payer: Self-pay | Admitting: Internal Medicine

## 2021-12-06 ENCOUNTER — Other Ambulatory Visit: Payer: Self-pay | Admitting: Internal Medicine

## 2021-12-06 NOTE — Telephone Encounter (Signed)
Please refill as per office routine med refill policy (all routine meds to be refilled for 3 mo or monthly (per pt preference) up to one year from last visit, then month to month grace period for 3 mo, then further med refills will have to be denied) ? ?

## 2022-01-13 ENCOUNTER — Encounter: Payer: Self-pay | Admitting: Emergency Medicine

## 2022-01-13 ENCOUNTER — Ambulatory Visit
Admission: EM | Admit: 2022-01-13 | Discharge: 2022-01-13 | Disposition: A | Discharge status: Home / Self Care | Payer: 59 | Attending: Physician Assistant | Admitting: Physician Assistant

## 2022-01-13 ENCOUNTER — Other Ambulatory Visit: Payer: Self-pay

## 2022-01-13 DIAGNOSIS — J019 Acute sinusitis, unspecified: Secondary | ICD-10-CM | POA: Diagnosis not present

## 2022-01-13 DIAGNOSIS — J209 Acute bronchitis, unspecified: Secondary | ICD-10-CM | POA: Diagnosis not present

## 2022-01-13 MED ORDER — BENZONATATE 100 MG PO CAPS: 100.0000 mg | ORAL_CAPSULE | Freq: Three times a day (TID) | ORAL | 0 refills | Status: DC

## 2022-01-13 MED ORDER — AMOXICILLIN-POT CLAVULANATE 875-125 MG PO TABS: 1.0000 | ORAL_TABLET | Freq: Two times a day (BID) | ORAL | 0 refills | Status: DC

## 2022-01-13 MED ORDER — PREDNISONE 20 MG PO TABS: 40.0000 mg | ORAL_TABLET | Freq: Every day | ORAL | 0 refills | Status: AC

## 2022-01-13 NOTE — ED Provider Notes (Signed)
?[facility] ? ? ? ?CSN: [id number] ?Arrival date & time: 01/13/22  0802 ? ? ?  ? ?History   ?Chief Complaint ?Chief Complaint  ?Patient presents with  ? Cough  ? ? ?HPI ?[name] is a 61 y.o. female.  ? ?Patient here today for evaluation of cough, chest congestion and sinus congestion and pressure that started 2 weeks ago.  She states she has had some fever at times.  She reports that she has tried [employer] with some relief.  She reports that coughing episodes will come out of nowhere and she has had episodes of vomiting due to cough. ? ?The history is provided by the patient.  ? ?Past Medical History:  ?Diagnosis Date  ? ALLERGIC RHINITIS 10/14/2008  ? ANXIETY 10/14/2008  ? ASTHMA 10/14/2008  ? DEGENERATIVE JOINT DISEASE 10/14/2008  ? GERD 10/14/2008  ? HYPERLIPIDEMIA 10/14/2008  ? INTERMITTENT VERTIGO 11/03/2008  ? NEPHROLITHIASIS, HX OF 10/14/2008  ? ? ?Patient Active Problem List  ? Diagnosis Date Noted  ? Infected tick bite of abdominal wall 03/22/2021  ? Acute sinusitis 03/10/2020  ? Trapezoid ligament sprain 08/30/2019  ? Cellulitis of right breast 07/03/2018  ? Bilateral leg pain 05/01/2018  ? 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 Primary osteoarthritis of both knees 04/23/2017  ? Chest pain 10/17/2016  ? Essential hypertension 10/17/2016  ? Macular degeneration 06/29/2016  ? Recurrent headache 06/29/2016  ? Hyperglycemia 06/15/2016  ? MRSA exposure 06/10/2016  ? Acute bronchitis 11/03/2014  ? Wheezing 11/03/2014  ? Myalgia 10/06/2014  ? Fever in adult 10/06/2014  ? Muscle strain of right gluteal region 06/22/2014  ? Greater trochanteric bursitis of right hip 05/17/2014  ? Lateral epicondylitis of right elbow 06/24/2013  ? Chronic pain 10/13/2012  ? Vertigo 05/09/2012  ? Encounter for well adult exam with abnormal findings 11/11/2011  ? INTERMITTENT VERTIGO 11/03/2008  ? HLD (hyperlipidemia) 10/14/2008  ? Anxiety state 10/14/2008  ? Allergic rhinitis 10/14/2008  ? Asthma 10/14/2008   ? GERD 10/14/2008  ? NEPHROLITHIASIS, HX OF 10/14/2008  ? ? ?Past Surgical History:  ?Procedure Laterality Date  ? APPENDECTOMY    ? CESAREAN SECTION    ? s/p left knee surgery    ? s/p left ulnar nerve surgery    ? x's 2  ? s/p nasal surgery    ? x's 2  ? s/p right knee surgery    ? x's 4- Cartilage torn  ? TONSILLECTOMY    ? ? ?OB History   ?No obstetric history on file. ?  ? ? ? ?Home Medications   ? ?Prior to Admission medications   ?Medication Sig Start Date End Date Taking? Authorizing Provider  ?amoxicillin-clavulanate (AUGMENTIN) 875-125 MG tablet Take 1 tablet by mouth every 12 (twelve) hours. 01/13/22  Yes Tomi Bamberger, PA-C  ?benzonatate (TESSALON) 100 MG capsule Take 1 capsule (100 mg total) by mouth every 8 (eight) hours. 01/13/22  Yes Tomi Bamberger, PA-C  ?predniSONE (DELTASONE) 20 MG tablet Take 2 tablets (40 mg total) by mouth daily with breakfast for 5 days. 01/13/22 01/18/22 Yes Tomi Bamberger, PA-C  ?acetaminophen (TYLENOL) 650 MG CR tablet Take 650 mg by mouth every 8 (eight) hours as needed for pain.    [provider]  ?indomethacin (INDOCIN SR) 75 MG CR capsule TAKE ONE CAPSULE BY MOUTH TWICE A DAY WITH MEALS AS NEEDED 11/07/21   Corwin Levins, MD  ?losartan-hydrochlorothiazide (HYZAAR) 50-12.5 MG tablet TAKE 1 TABLET BY MOUTH  EVERY DAY 11/03/21   Corwin Levins, MD  ?Multiple Vitamins-Minerals (VITAMIN D3 COMPLETE PO) Take 500 Units by mouth daily.    [provider]  ?nortriptyline (PAMELOR) 10 MG capsule Take 1 capsule (10 mg total) by mouth at bedtime. Take 10 mg by mouth at bedtime. 08/27/19   Corwin Levins, MD  ? ? ?Family History ?Family History  ?Problem Relation Age of Onset  ? Diabetes Maternal Grandfather   ? Cancer Maternal Grandfather   ? ? ?Social History ?Social History  ? ?Tobacco Use  ? Smoking status: Never  ? Smokeless tobacco: Never  ?Vaping Use  ? Vaping Use: Never used  ?Substance Use Topics  ? Alcohol use: No  ? ? ? ?Allergies   ?Codeine and  Tramadol ? ? ?Review of Systems ?Review of Systems  ?Constitutional:  Positive for fever.  ?HENT:  Positive for congestion and sinus pressure. Negative for ear pain.   ?Eyes:  Negative for discharge and redness.  ?Respiratory:  Positive for cough. Negative for shortness of breath and wheezing.   ?Gastrointestinal:  Negative for abdominal pain, diarrhea, nausea and vomiting.  ? ? ?Physical Exam ?Triage Vital Signs ?ED Triage Vitals  ?Enc Vitals Group  ?   BP   ?   Pulse   ?   Resp   ?   Temp   ?   Temp src   ?   SpO2   ?   Weight   ?   Height   ?   Head Circumference   ?   Peak Flow   ?   Pain Score   ?   Pain Loc   ?   Pain Edu?   ?   Excl. in GC?   ? ?No data found. ? ?Updated Vital Signs ?BP (!) 158/93 (BP Location: Left Arm)   Pulse (!) 102   Temp 98 ?F (36.7 ?C) (Oral)   Resp 18   LMP 06/10/2015   SpO2 96%  ?   ? ?Physical Exam ?Vitals and nursing note reviewed.  ?Constitutional:   ?   General: She is not in acute distress. ?   Appearance: Normal appearance. She is not ill-appearing.  ?HENT:  ?   Head: Normocephalic and atraumatic.  ?   Nose: Congestion present.  ?Eyes:  ?   Conjunctiva/sclera: Conjunctivae normal.  ?Cardiovascular:  ?   Rate and Rhythm: Normal rate and regular rhythm.  ?   Heart sounds: Normal heart sounds. No murmur heard. ?Pulmonary:  ?   Effort: Pulmonary effort is normal. No respiratory distress.  ?   Breath sounds: Normal breath sounds. No wheezing, rhonchi or rales.  ?   Comments: Deep breathing produces cough ?Skin: ?   General: Skin is warm and dry.  ?Neurological:  ?   Mental Status: She is alert.  ?Psychiatric:     ?   Mood and Affect: Mood normal.     ?   Thought Content: Thought content normal.  ? ? ? ?UC Treatments / Results  ?Labs ?(all labs ordered are listed, but only abnormal results are displayed) ?Labs Reviewed - No data to display ? ?EKG ? ? ?Radiology ?No results found. ? ?Procedures ?Procedures (including critical care time) ? ?Medications Ordered in UC ?Medications -  No data to display ? ?Initial Impression / Assessment and Plan / UC Course  ?I have reviewed the triage vital signs and the nursing notes. ? ?Pertinent labs & imaging results that were available during  my care of the patient were reviewed by me and considered in my medical decision making (see chart for details). ? ?  ?Augmentin prescribed to cover sinusitis.  Prednisone prescribed for suspected bronchitis as well as Tessalon Perles to help with cough.  Encouraged follow-up if symptoms fail to improve or worsen. ? ?Final Clinical Impressions(s) / UC Diagnoses  ? ?Final diagnoses:  ?Acute bronchitis, unspecified organism  ?Acute sinusitis, recurrence not specified, unspecified location  ? ?Discharge Instructions   ?None ?  ? ?ED Prescriptions   ? ? Medication Sig Dispense Auth. Provider  ? amoxicillin-clavulanate (AUGMENTIN) 875-125 MG tablet Take 1 tablet by mouth every 12 (twelve) hours. 14 tablet Erma Pinto F, PA-C  ? predniSONE (DELTASONE) 20 MG tablet Take 2 tablets (40 mg total) by mouth daily with breakfast for 5 days. 10 tablet Tomi Bamberger, PA-C  ? benzonatate (TESSALON) 100 MG capsule Take 1 capsule (100 mg total) by mouth every 8 (eight) hours. 21 capsule Tomi Bamberger, PA-C  ? ?  ? ?PDMP not reviewed this encounter. ?  ?Tomi Bamberger, PA-C ?01/13/22 [phone] ? ?

## 2022-01-13 NOTE — ED Triage Notes (Signed)
Pt here for cough and congestion with sinus pressure x 2 weeks; pt sts some fever ?

## 2022-01-15 ENCOUNTER — Ambulatory Visit: Payer: 59 | Admitting: Internal Medicine

## 2022-02-15 ENCOUNTER — Other Ambulatory Visit: Payer: Self-pay | Admitting: Internal Medicine

## 2022-05-17 ENCOUNTER — Other Ambulatory Visit: Payer: Self-pay | Admitting: Internal Medicine

## 2022-06-15 ENCOUNTER — Other Ambulatory Visit: Payer: Self-pay | Admitting: Internal Medicine

## 2022-06-20 ENCOUNTER — Encounter: Payer: Self-pay | Admitting: Internal Medicine

## 2022-06-20 ENCOUNTER — Ambulatory Visit (INDEPENDENT_AMBULATORY_CARE_PROVIDER_SITE_OTHER): Payer: 59 | Admitting: Internal Medicine

## 2022-06-20 VITALS — BP 134/82 | HR 98 | Temp 98.5°F | Ht 68.5 in | Wt 294.0 lb

## 2022-06-20 DIAGNOSIS — I10 Essential (primary) hypertension: Secondary | ICD-10-CM

## 2022-06-20 DIAGNOSIS — R739 Hyperglycemia, unspecified: Secondary | ICD-10-CM | POA: Diagnosis not present

## 2022-06-20 DIAGNOSIS — E538 Deficiency of other specified B group vitamins: Secondary | ICD-10-CM

## 2022-06-20 DIAGNOSIS — E559 Vitamin D deficiency, unspecified: Secondary | ICD-10-CM

## 2022-06-20 DIAGNOSIS — E7800 Pure hypercholesterolemia, unspecified: Secondary | ICD-10-CM

## 2022-06-20 DIAGNOSIS — G8929 Other chronic pain: Secondary | ICD-10-CM

## 2022-06-20 DIAGNOSIS — Z0001 Encounter for general adult medical examination with abnormal findings: Secondary | ICD-10-CM

## 2022-06-20 LAB — BASIC METABOLIC PANEL
BUN: 20 mg/dL (ref 6–23)
CO2: 23 mEq/L (ref 19–32)
Calcium: 10.1 mg/dL (ref 8.4–10.5)
Chloride: 104 mEq/L (ref 96–112)
Creatinine, Ser: 0.61 mg/dL (ref 0.40–1.20)
GFR: 97.76 mL/min (ref >60.00)
Glucose, Bld: 107 mg/dL — ABNORMAL HIGH (ref 70–99)
Potassium: 4.2 mEq/L (ref 3.5–5.1)
Sodium: 136 mEq/L (ref 135–145)

## 2022-06-20 LAB — CBC WITH DIFFERENTIAL/PLATELET
Basophils Absolute: 0.1 10*3/uL (ref 0.0–0.1)
Basophils Relative: 0.8 % (ref 0.0–3.0)
Eosinophils Absolute: 0.2 10*3/uL (ref 0.0–0.7)
Eosinophils Relative: 1.9 % (ref 0.0–5.0)
HCT: 43.4 % (ref 36.0–46.0)
Hemoglobin: 14.1 g/dL (ref 12.0–15.0)
Lymphocytes Relative: 22.9 % (ref 12.0–46.0)
Lymphs Abs: 3 10*3/uL (ref 0.7–4.0)
MCHC: 32.5 g/dL (ref 30.0–36.0)
MCV: 88.2 fl (ref 78.0–100.0)
Monocytes Absolute: 1.2 10*3/uL — ABNORMAL HIGH (ref 0.1–1.0)
Monocytes Relative: 9.2 % (ref 3.0–12.0)
Neutro Abs: 8.5 10*3/uL — ABNORMAL HIGH (ref 1.4–7.7)
Neutrophils Relative %: 65.2 % (ref 43.0–77.0)
Platelets: 475 10*3/uL — ABNORMAL HIGH (ref 150.0–400.0)
RBC: 4.92 Mil/uL (ref 3.87–5.11)
RDW: 13.9 % (ref 11.5–15.5)
WBC: 13.1 10*3/uL — ABNORMAL HIGH (ref 4.0–10.5)

## 2022-06-20 LAB — LIPID PANEL
Cholesterol: 192 mg/dL (ref 0–200)
HDL: 68 mg/dL (ref >39.00)
LDL Cholesterol: 98 mg/dL (ref 0–99)
NonHDL: 124.29
Total CHOL/HDL Ratio: 3
Triglycerides: 131 mg/dL (ref 0.0–149.0)
VLDL: 26.2 mg/dL (ref 0.0–40.0)

## 2022-06-20 LAB — HEPATIC FUNCTION PANEL
ALT: 20 U/L (ref 0–35)
AST: 18 U/L (ref 0–37)
Albumin: 4.1 g/dL (ref 3.5–5.2)
Alkaline Phosphatase: 84 U/L (ref 39–117)
Bilirubin, Direct: 0.1 mg/dL (ref 0.0–0.3)
Total Bilirubin: 0.4 mg/dL (ref 0.2–1.2)
Total Protein: 7.3 g/dL (ref 6.0–8.3)

## 2022-06-20 LAB — HEMOGLOBIN A1C: Hgb A1c MFr Bld: 5.5 % (ref 4.6–6.5)

## 2022-06-20 LAB — MICROALBUMIN / CREATININE URINE RATIO
Creatinine,U: 167.2 mg/dL
Microalb Creat Ratio: 2.2 mg/g (ref 0.0–30.0)
Microalb, Ur: 3.7 mg/dL — ABNORMAL HIGH (ref 0.0–1.9)

## 2022-06-20 LAB — TSH: TSH: 0.68 u[IU]/mL (ref 0.35–5.50)

## 2022-06-20 LAB — VITAMIN D 25 HYDROXY (VIT D DEFICIENCY, FRACTURES): VITD: 20.95 ng/mL — ABNORMAL LOW (ref 30.00–100.00)

## 2022-06-20 LAB — VITAMIN B12: Vitamin B-12: 263 pg/mL (ref 211–911)

## 2022-06-20 MED ORDER — LOSARTAN POTASSIUM-HCTZ 50-12.5 MG PO TABS: 1.0000 | ORAL_TABLET | Freq: Every day | ORAL | 1 refills | Status: DC

## 2022-06-20 NOTE — Progress Notes (Signed)
Patient ID: [name], female   DOB: [birth year], 61 y.o.   MRN: [id number]         Chief Complaint:: wellness exam and low vit d, htn, chronic left lower back pain and recent sciatica, chronic bilateral knee pain       HPI:  [name] is a 61 y.o. female here for wellness exam; plans to see GYN soon for pap, o/w up to date                        Also not taking Vit d.  BP has been elevated mild at home but has been out of BP med for several weeks.  Pt continues to have recurring left LBP with recent left sciatica, but no bowel or bladder change, fever, wt loss,  worsening LE numbness/weakness, gait change or falls.  Seen at [facility] recently with prednisone taper and pain now resolved.  Does also have persistent daily mod to severe bilateral knee pain due to DJD, needs bilateral TKR but needs to get wt down to at least 250 prior per pt per Dr Lequita Halt.     Wt Readings from Last 3 Encounters:  06/20/22 294 lb (133.4 kg)  03/22/21 296 lb (134.3 kg)  03/10/20 286 lb (129.7 kg)   BP Readings from Last 3 Encounters:  06/20/22 134/82  01/13/22 (!) 158/93  03/22/21 120/70   Immunization History  Administered Date(s) Administered   Influenza Inj Mdck Quad Pf 08/19/2019   Influenza Whole 09/14/2008, 08/29/2010   Influenza-Unspecified 08/11/2016   Janssen (J&J) SARS-COV-2 Vaccination 02/11/2020   Tdap 10/06/2014   Zoster Recombinat (Shingrix) 09/12/2017, 01/01/2018   There are no preventive care reminders to display for this patient.     Past Medical History:  Diagnosis Date   ALLERGIC RHINITIS 10/14/2008   ANXIETY 10/14/2008   ASTHMA 10/14/2008   DEGENERATIVE JOINT DISEASE 10/14/2008   GERD 10/14/2008   HYPERLIPIDEMIA 10/14/2008   INTERMITTENT VERTIGO 11/03/2008   NEPHROLITHIASIS, HX OF 10/14/2008   Past Surgical History:  Procedure Laterality Date   APPENDECTOMY     CESAREAN SECTION     s/p left knee surgery     s/p left ulnar nerve surgery     x's 2   s/p nasal surgery     x's  2   s/p right knee surgery     x's 4- Cartilage torn   TONSILLECTOMY      reports that she has never smoked. She has never used smokeless tobacco. She reports that she does not drink alcohol. No history on file for drug use. family history includes Cancer in her maternal grandfather; Diabetes in her maternal grandfather. Allergies  Allergen Reactions   Codeine     itching   Tramadol Rash   Current Outpatient Medications on File Prior to Visit  Medication Sig Dispense Refill   acetaminophen (TYLENOL) 650 MG CR tablet Take 650 mg by mouth every 8 (eight) hours as needed for pain.     indomethacin (INDOCIN SR) 75 MG CR capsule TAKE ONE CAPSULE BY MOUTH TWICE A DAY WITH MEALS AS NEEDED 60 capsule 0   Multiple Vitamins-Minerals (VITAMIN D3 COMPLETE PO) Take 500 Units by mouth daily.     nortriptyline (PAMELOR) 10 MG capsule Take 1 capsule (10 mg total) by mouth at bedtime. Take 10 mg by mouth at bedtime. (Patient taking differently: Take 10 mg by mouth 3 (three) times daily. Take 3 capsules by mouth at every  morning.) 90 capsule 3   No current facility-administered medications on file prior to visit.        ROS:  All others reviewed and negative.  Objective        PE:  BP 134/82 (BP Location: Right Arm, Patient Position: Sitting, Cuff Size: Large)   Pulse 98   Temp 98.5 F (36.9 C) (Oral)   Ht 5' 8.5" (1.74 m)   Wt 294 lb (133.4 kg)   LMP 06/10/2015   SpO2 97%   BMI 44.05 kg/m                 Constitutional: Pt appears in NAD               HENT: Head: NCAT.                Right Ear: External ear normal.                 Left Ear: External ear normal.                Eyes: . Pupils are equal, round, and reactive to light. Conjunctivae and EOM are normal               Nose: without d/c or deformity               Neck: Neck supple. Gross normal ROM               Cardiovascular: Normal rate and regular rhythm.                 Pulmonary/Chest: Effort normal and breath sounds without  rales or wheezing.                Abd:  Soft, NT, ND, + BS, no organomegaly               Neurological: Pt is alert. At baseline orientation, motor grossly intact               Skin: Skin is warm. No rashes, no other new lesions, LE edema - none               Psychiatric: Pt behavior is normal without agitation   Micro: none  Cardiac tracings I have personally interpreted today:  none  Pertinent Radiological findings (summarize): none   Lab Results  Component Value Date   WBC 8.5 08/27/2019   HGB 14.9 08/27/2019   HCT 44.5 08/27/2019   PLT 517.0 (H) 08/27/2019   GLUCOSE 126 (H) 08/27/2019   CHOL 257 (H) 08/27/2019   TRIG 180.0 (H) 08/27/2019   HDL 70.90 08/27/2019   LDLDIRECT 109.3 09/06/2010   LDLCALC 151 (H) 08/27/2019   ALT 28 08/27/2019   AST 19 08/27/2019   NA 136 08/27/2019   K 3.9 08/27/2019   CL 103 08/27/2019   CREATININE 0.75 08/27/2019   BUN 21 08/27/2019   CO2 23 08/27/2019   TSH 0.64 08/27/2019   INR 0.90 11/14/2010   HGBA1C 5.5 08/27/2019   Assessment/Plan:  [name] is a 61 y.o. White or Caucasian [1] female with  has a past medical history of ALLERGIC RHINITIS (10/14/2008), ANXIETY (10/14/2008), ASTHMA (10/14/2008), DEGENERATIVE JOINT DISEASE (10/14/2008), GERD (10/14/2008), HYPERLIPIDEMIA (10/14/2008), INTERMITTENT VERTIGO (11/03/2008), and NEPHROLITHIASIS, HX OF (10/14/2008).  Vitamin D deficiency Last vitamin D Lab Results  Component Value Date   VD25OH 16.09 (L) 08/27/2019   Low, to start oral replacement   Chronic pain With bilateral  knees needing TKR per Dr Lequita Halt, but has to lose to about 250 lbs to qualify  Encounter for well adult exam with abnormal findings Age and sex appropriate education and counseling updated with regular exercise and diet Referrals for preventative services - to see GYN soon per pt Immunizations addressed - none needed Smoking counseling  - none needed Evidence for depression or other mood disorder - none  significant Most recent labs reviewed. I have personally reviewed and have noted: 1) the patient's medical and social history 2) The patient's current medications and supplements 3) The patient's height, weight, and BMI have been recorded in the chart   HLD (hyperlipidemia) Lab Results  Component Value Date   LDLCALC 151 (H) 08/27/2019   Uncontrolled,  pt to continue current low chol diet, delcines statin for now   Hyperglycemia Lab Results  Component Value Date   HGBA1C 5.5 08/27/2019   Stable, pt to continue current medical treatment  - diet, wt control, activity   Essential hypertension BP Readings from Last 3 Encounters:  06/20/22 134/82  01/13/22 (!) 158/93  03/22/21 120/70   Stable, pt to continue medical treatment hyzaa 50 - 12.5 mg qd  Followup: No follow-ups on file.  Oliver Barre, MD 06/20/2022 9:20 PM [facility] - [facility]

## 2022-06-20 NOTE — Patient Instructions (Signed)

## 2022-06-20 NOTE — Assessment & Plan Note (Signed)
Last vitamin D Lab Results  Component Value Date   VD25OH 16.09 (L) 08/27/2019   Low, to start oral replacement

## 2022-06-20 NOTE — Assessment & Plan Note (Signed)
With bilateral knees needing TKR per Dr Lequita Halt, but has to lose to about 250 lbs to qualify

## 2022-06-20 NOTE — Assessment & Plan Note (Signed)
Lab Results  Component Value Date   HGBA1C 5.5 08/27/2019   Stable, pt to continue current medical treatment  - diet, wt control, activity

## 2022-06-20 NOTE — Assessment & Plan Note (Signed)
Age and sex appropriate education and counseling updated with regular exercise and diet Referrals for preventative services - to see GYN soon per pt Immunizations addressed - none needed Smoking counseling  - none needed Evidence for depression or other mood disorder - none significant Most recent labs reviewed. I have personally reviewed and have noted: 1) the patient's medical and social history 2) The patient's current medications and supplements 3) The patient's height, weight, and BMI have been recorded in the chart

## 2022-06-20 NOTE — Assessment & Plan Note (Signed)
Lab Results  Component Value Date   LDLCALC 151 (H) 08/27/2019   Uncontrolled,  pt to continue current low chol diet, delcines statin for now

## 2022-06-20 NOTE — Assessment & Plan Note (Signed)
BP Readings from Last 3 Encounters:  06/20/22 134/82  01/13/22 (!) 158/93  03/22/21 120/70   Stable, pt to continue medical treatment hyzaa 50 - 12.5 mg qd

## 2022-06-21 LAB — URINALYSIS, ROUTINE W REFLEX MICROSCOPIC
Bilirubin Urine: NEGATIVE
Ketones, ur: NEGATIVE
Leukocytes,Ua: NEGATIVE
Nitrite: NEGATIVE
Specific Gravity, Urine: >=1.03 — AB (ref 1.000–1.030)
Urine Glucose: NEGATIVE
Urobilinogen, UA: 0.2 (ref 0.0–1.0)
pH: 5.5 (ref 5.0–8.0)

## 2022-06-25 ENCOUNTER — Encounter: Payer: Self-pay | Admitting: Internal Medicine

## 2022-06-26 NOTE — Progress Notes (Signed)
Printed & Mailed lett er to pt address../lmb 

## 2022-07-14 ENCOUNTER — Other Ambulatory Visit: Payer: Self-pay | Admitting: Internal Medicine

## 2022-10-18 ENCOUNTER — Other Ambulatory Visit: Payer: Self-pay | Admitting: Orthopaedic Surgery

## 2022-10-18 DIAGNOSIS — M48062 Spinal stenosis, lumbar region with neurogenic claudication: Secondary | ICD-10-CM

## 2022-10-18 DIAGNOSIS — M5416 Radiculopathy, lumbar region: Secondary | ICD-10-CM

## 2022-10-27 ENCOUNTER — Ambulatory Visit
Admission: EM | Admit: 2022-10-27 | Discharge: 2022-10-27 | Disposition: A | Discharge status: Home / Self Care | Payer: 59 | Attending: Internal Medicine | Admitting: Internal Medicine

## 2022-10-27 DIAGNOSIS — J208 Acute bronchitis due to other specified organisms: Secondary | ICD-10-CM | POA: Diagnosis not present

## 2022-10-27 MED ORDER — PREDNISONE 20 MG PO TABS: 40.0000 mg | ORAL_TABLET | Freq: Every day | ORAL | 0 refills | Status: AC

## 2022-10-27 MED ORDER — BENZONATATE 100 MG PO CAPS: 100.0000 mg | ORAL_CAPSULE | Freq: Three times a day (TID) | ORAL | 0 refills | Status: DC | PRN

## 2022-10-27 NOTE — ED Provider Notes (Signed)
[facility]    CSN: [id number] Arrival date & time: 10/27/22  4128      History   Chief Complaint Chief Complaint  Patient presents with   URI    HPI [name] is a 62 y.o. female.   Patient presents with nasal congestion, generalized body aches, fever, chills, cough that started about 8 days ago.  Patient reports that her daughter has had similar symptoms.  Patient is not sure of Tmax at home but states that she felt feverish.  Patient has taken over-the-counter cold and flu medications such as DayQuil and NyQuil with minimal improvement in symptoms.  Patient reports that she took a COVID test at home that was negative.  Denies history of COPD but reports history of asthma "a long time ago".  Denies chest pain, shortness of breath, sore throat, ear pain, nausea, vomiting, diarrhea   URI   Past Medical History:  Diagnosis Date   ALLERGIC RHINITIS 10/14/2008   ANXIETY 10/14/2008   ASTHMA 10/14/2008   DEGENERATIVE JOINT DISEASE 10/14/2008   GERD 10/14/2008   HYPERLIPIDEMIA 10/14/2008   INTERMITTENT VERTIGO 11/03/2008   NEPHROLITHIASIS, HX OF 10/14/2008    Patient Active Problem List   Diagnosis Date Noted   Vitamin D deficiency 06/20/2022   Infected tick bite of abdominal wall 03/22/2021   Acute sinusitis 03/10/2020   Kidney stones 09/02/2019   Trapezoid ligament sprain 08/30/2019   Cellulitis of right breast 07/03/2018   Bilateral leg pain 05/01/2018   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   Primary osteoarthritis of both knees 04/23/2017   Chest pain 10/17/2016   Essential hypertension 10/17/2016   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   Macular degeneration 06/29/2016   Recurrent headache 06/29/2016   Hyperglycemia 06/15/2016   MRSA exposure 06/10/2016   Cervical disc disorder 05/11/2016   Acute bronchitis 11/03/2014   Wheezing 11/03/2014   Myalgia 10/06/2014   Fever in adult 10/06/2014   Muscle strain of right gluteal region 06/22/2014    Greater trochanteric bursitis of right hip 05/17/2014   Lateral epicondylitis of right elbow 06/24/2013   Chronic pain 10/13/2012   Vertigo 05/09/2012   Encounter for well adult exam with abnormal findings 11/11/2011   INTERMITTENT VERTIGO 11/03/2008   HLD (hyperlipidemia) 10/14/2008   Anxiety state 10/14/2008   Allergic rhinitis 10/14/2008   Asthma 10/14/2008   GERD 10/14/2008   NEPHROLITHIASIS, HX OF 10/14/2008    Past Surgical History:  Procedure Laterality Date   APPENDECTOMY     CESAREAN SECTION     s/p left knee surgery     s/p left ulnar nerve surgery     x's 2   s/p nasal surgery     x's 2   s/p right knee surgery     x's 4- Cartilage torn   TONSILLECTOMY      OB History   No obstetric history on file.      Home Medications    Prior to Admission medications   Medication Sig Start Date End Date Taking? Authorizing Provider  benzonatate (TESSALON) 100 MG capsule Take 1 capsule (100 mg total) by mouth every 8 (eight) hours as needed for cough. 10/27/22  Yes Paitynn Mikus, Rolly Salter E, FNP  predniSONE (DELTASONE) 20 MG tablet Take 2 tablets (40 mg total) by mouth daily for 5 days. 10/27/22 11/01/22 Yes Brennon Otterness, Acie Fredrickson, FNP  acetaminophen (TYLENOL) 650 MG CR tablet Take 650 mg by mouth every 8 (eight) hours as needed for pain.    [provider]  indomethacin (INDOCIN SR) 75 MG CR capsule TAKE 1 CAPSULE BY MOUTH TWICE A DAY WITH MEALS AS NEEDED 07/14/22   Corwin Levins, MD  losartan-hydrochlorothiazide (HYZAAR) 50-12.5 MG tablet Take 1 tablet by mouth daily. 06/20/22   Corwin Levins, MD  Multiple Vitamins-Minerals (VITAMIN D3 COMPLETE PO) Take 500 Units by mouth daily.    [provider]  nortriptyline (PAMELOR) 10 MG capsule Take 1 capsule (10 mg total) by mouth at bedtime. Take 10 mg by mouth at bedtime. Patient taking differently: Take 10 mg by mouth 3 (three) times daily. Take 3 capsules by mouth at every morning. 08/27/19   Corwin Levins, MD    Family  History Family History  Problem Relation Age of Onset   Diabetes Maternal Grandfather    Cancer Maternal Grandfather     Social History Social History   Tobacco Use   Smoking status: Never   Smokeless tobacco: Never  Vaping Use   Vaping Use: Never used  Substance Use Topics   Alcohol use: No     Allergies   Codeine and Tramadol   Review of Systems Review of Systems Per HPI  Physical Exam Triage Vital Signs ED Triage Vitals  Enc Vitals Group     BP 10/27/22 0932 138/88     Pulse Rate 10/27/22 0932 94     Resp 10/27/22 0932 20     Temp 10/27/22 0932 98.3 F (36.8 C)     Temp Source 10/27/22 0932 Oral     SpO2 10/27/22 0932 95 %     Weight --      Height --      Head Circumference --      Peak Flow --      Pain Score 10/27/22 0941 0     Pain Loc --      Pain Edu? --      Excl. in GC? --    No data found.  Updated Vital Signs BP 138/88 (BP Location: Left Arm)   Pulse 94   Temp 98.3 F (36.8 C) (Oral)   Resp 20   LMP 06/10/2015   SpO2 95%   Visual Acuity Right Eye Distance:   Left Eye Distance:   Bilateral Distance:    Right Eye Near:   Left Eye Near:    Bilateral Near:     Physical Exam Constitutional:      General: She is not in acute distress.    Appearance: Normal appearance. She is not toxic-appearing or diaphoretic.  HENT:     Head: Normocephalic and atraumatic.     Right Ear: Tympanic membrane and ear canal normal.     Left Ear: Tympanic membrane and ear canal normal.     Nose: Congestion present.     Mouth/Throat:     Mouth: Mucous membranes are moist.     Pharynx: No posterior oropharyngeal erythema.  Eyes:     Extraocular Movements: Extraocular movements intact.     Conjunctiva/sclera: Conjunctivae normal.     Pupils: Pupils are equal, round, and reactive to light.  Cardiovascular:     Rate and Rhythm: Normal rate and regular rhythm.     Pulses: Normal pulses.     Heart sounds: Normal heart sounds.  Pulmonary:     Effort:  Pulmonary effort is normal. No respiratory distress.     Breath sounds: Normal breath sounds. No stridor. No wheezing, rhonchi or rales.  Abdominal:     General: Abdomen is flat. Bowel sounds  are normal.     Palpations: Abdomen is soft.  Musculoskeletal:        General: Normal range of motion.     Cervical back: Normal range of motion.  Skin:    General: Skin is warm and dry.  Neurological:     General: No focal deficit present.     Mental Status: She is alert and oriented to person, place, and time. Mental status is at baseline.  Psychiatric:        Mood and Affect: Mood normal.        Behavior: Behavior normal.      UC Treatments / Results  Labs (all labs ordered are listed, but only abnormal results are displayed) Labs Reviewed - No data to display  EKG   Radiology No results found.  Procedures Procedures (including critical care time)  Medications Ordered in UC Medications - No data to display  Initial Impression / Assessment and Plan / UC Course  I have reviewed the triage vital signs and the nursing notes.  Pertinent labs & imaging results that were available during my care of the patient were reviewed by me and considered in my medical decision making (see chart for details).     Patient presents with symptoms likely from a viral upper respiratory infection. Differential includes bacterial pneumonia, sinusitis, allergic rhinitis, COVID-19, flu, RSV.  Suspicious of possible acute viral bronchitis given duration of symptoms and symptoms being refractory to over-the-counter medications. Symptoms seem unlikely related to ACS, CHF or COPD exacerbations, pneumonia, pneumothorax. Patient is nontoxic appearing and not in need of emergent medical intervention.  Do not think viral testing is necessary given duration of symptoms as it would not change treatment.  Recommended symptom control with medications and supportive care.  Patient requesting Jerilynn Som as they  have been helpful in the past.  This was sent for patient to the pharmacy.  Also think patient would benefit from prednisone given duration of symptoms and suspicion of bronchitis.  Patient has taken this before and tolerated well.  No obvious contraindications to prednisone noted in patient's history.  Will prescribe steroid burst.  Return if symptoms fail to improve. Patient states understanding and is agreeable.  Discharged with PCP followup.  Final Clinical Impressions(s) / UC Diagnoses   Final diagnoses:  Acute viral bronchitis     Discharge Instructions      It appears that you have viral bronchitis.  I have treated you with prednisone and a cough medication.  Please follow-up if symptoms persist or worsen.    ED Prescriptions     Medication Sig Dispense Auth. Provider   benzonatate (TESSALON) 100 MG capsule Take 1 capsule (100 mg total) by mouth every 8 (eight) hours as needed for cough. 21 capsule Nelson, [place] E, [place]   predniSONE (DELTASONE) 20 MG tablet Take 2 tablets (40 mg total) by mouth daily for 5 days. 10 tablet Gustavus Bryant, [place]      PDMP not reviewed this encounter.   Gustavus Bryant, [place] 10/27/22 1102

## 2022-10-27 NOTE — Discharge Instructions (Signed)
It appears that you have viral bronchitis.  I have treated you with prednisone and a cough medication.  Please follow-up if symptoms persist or worsen.

## 2022-10-27 NOTE — ED Triage Notes (Signed)
Pt presents to [facility] with co of congestion, body aches, fevers and chills for 1 week ago and has been taking otc medications with minimal improvement. Pt reports neg at home covid test.

## 2022-10-29 ENCOUNTER — Ambulatory Visit: Payer: 59 | Admitting: Internal Medicine

## 2022-11-14 ENCOUNTER — Other Ambulatory Visit: Payer: Self-pay | Admitting: Obstetrics and Gynecology

## 2022-11-14 DIAGNOSIS — Z1231 Encounter for screening mammogram for malignant neoplasm of breast: Secondary | ICD-10-CM

## 2022-11-19 ENCOUNTER — Ambulatory Visit
Admission: RE | Admit: 2022-11-19 | Discharge: 2022-11-19 | Disposition: A | Discharge status: Home / Self Care | Payer: 59 | Source: Ambulatory Visit | Attending: Obstetrics and Gynecology | Admitting: Obstetrics and Gynecology

## 2022-11-19 DIAGNOSIS — Z1231 Encounter for screening mammogram for malignant neoplasm of breast: Secondary | ICD-10-CM

## 2022-11-21 ENCOUNTER — Other Ambulatory Visit: Payer: 59

## 2022-11-22 ENCOUNTER — Other Ambulatory Visit: Payer: Self-pay

## 2022-11-22 ENCOUNTER — Ambulatory Visit: Payer: 59 | Attending: Obstetrics and Gynecology | Admitting: Physical Therapy

## 2022-11-22 ENCOUNTER — Encounter: Payer: Self-pay | Admitting: Physical Therapy

## 2022-11-22 DIAGNOSIS — R279 Unspecified lack of coordination: Secondary | ICD-10-CM | POA: Diagnosis present

## 2022-11-22 DIAGNOSIS — M6281 Muscle weakness (generalized): Secondary | ICD-10-CM | POA: Diagnosis present

## 2022-11-22 DIAGNOSIS — M62838 Other muscle spasm: Secondary | ICD-10-CM | POA: Diagnosis present

## 2022-11-22 DIAGNOSIS — R293 Abnormal posture: Secondary | ICD-10-CM | POA: Diagnosis present

## 2022-11-22 NOTE — Therapy (Signed)
OUTPATIENT PHYSICAL THERAPY FEMALE PELVIC EVALUATION   Patient Name: [name] MRN: [id number] DOB:03-Jun-1961, 62 y.o., female Today's Date: 11/22/2022  END OF SESSION:  PT End of Session - 11/22/22 1523     Visit Number 1    Date for PT Re-Evaluation 02/14/23    Authorization Type [employer]    PT Start Time 1400    PT Stop Time 2725    PT Time Calculation (min) 44 min    Activity Tolerance Patient tolerated treatment well    Behavior During Therapy WFL for tasks assessed/performed             Past Medical History:  Diagnosis Date   ALLERGIC RHINITIS 10/14/2008   ANXIETY 10/14/2008   ASTHMA 10/14/2008   DEGENERATIVE JOINT DISEASE 10/14/2008   GERD 10/14/2008   HYPERLIPIDEMIA 10/14/2008   INTERMITTENT VERTIGO 11/03/2008   NEPHROLITHIASIS, HX OF 10/14/2008   Past Surgical History:  Procedure Laterality Date   APPENDECTOMY     CESAREAN SECTION     s/p left knee surgery     s/p left ulnar nerve surgery     x's 2   s/p nasal surgery     x's 2   s/p right knee surgery     x's 4- Cartilage torn   TONSILLECTOMY     Patient Active Problem List   Diagnosis Date Noted   Vitamin D deficiency 06/20/2022   Infected tick bite of abdominal wall 03/22/2021   Acute sinusitis 03/10/2020   Kidney stones 09/02/2019   Trapezoid ligament sprain 08/30/2019   Cellulitis of right breast 07/03/2018   Bilateral leg pain 05/01/2018   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   Primary osteoarthritis of both knees 04/23/2017   Chest pain 10/17/2016   Essential hypertension 10/17/2016   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   Macular degeneration 06/29/2016   Recurrent headache 06/29/2016   Hyperglycemia 06/15/2016   MRSA exposure 06/10/2016   Cervical disc disorder 05/11/2016   Acute bronchitis 11/03/2014   Wheezing 11/03/2014   Myalgia 10/06/2014   Fever in adult 10/06/2014   Muscle strain of right gluteal region 06/22/2014   Greater trochanteric bursitis of right hip 05/17/2014    Lateral epicondylitis of right elbow 06/24/2013   Chronic pain 10/13/2012   Vertigo 05/09/2012   Encounter for well adult exam with abnormal findings 11/11/2011   INTERMITTENT VERTIGO 11/03/2008   HLD (hyperlipidemia) 10/14/2008   Anxiety state 10/14/2008   Allergic rhinitis 10/14/2008   Asthma 10/14/2008   GERD 10/14/2008   NEPHROLITHIASIS, HX OF 10/14/2008    PCP: Biagio Borg, MD  REFERRING PROVIDER:   Tyson Dense, MD   REFERRING DIAG: N39.3 (ICD-10-CM) - Stress incontinence (female) (female)   THERAPY DIAG:  Unspecified lack of coordination  Abnormal posture  Other muscle spasm  Muscle weakness (generalized)  Rationale for Evaluation and Treatment: Rehabilitation  ONSET DATE: a couple of years  SUBJECTIVE:  SUBJECTIVE STATEMENT: When I sit and then get up then I feel extreme urgency and can barely make it to the bathroom.  This is worsening.  I am having a lot of pressure with gynecological exams Fluid intake: Yes: 2 bottles of water and pepsi and/or tea with meals    PAIN:  Are you having pain? No  PRECAUTIONS: None  WEIGHT BEARING RESTRICTIONS: No  FALLS:  Has patient fallen in last 6 months? No  LIVING ENVIRONMENT: Lives with: lives with their family and daughter and her husband Lives in: House/apartment   OCCUPATION: retired  PLOF: Independent  PATIENT GOALS: not have extreme urgency and leakage managed   PERTINENT HISTORY:  C-section, Lt knee surgery x2; Rt knee x4, appendectomy, GERD, PMH kidney stone, back pain Sexual abuse: No  BOWEL MOVEMENT: Pain with bowel movement: No Type of bowel movement:Type (Bristol Stool Scale) loose recently, Frequency every other day, and Strain Yes Fully empty rectum: Yes:   Leakage: No Pads: No Fiber supplement:  No  URINATION: Pain with urination: No Fully empty bladder: Yes:   Stream: Strong Urgency: Yes:   Frequency: 3 hours Leakage: Urge to void and Coughing Pads: No   INTERCOURSE: Pain with intercourse: Not currently active  PREGNANCY: C-section deliveries 1   PROLAPSE: None   OBJECTIVE:   DIAGNOSTIC FINDINGS:    PATIENT SURVEYS:    PFIQ-7   COGNITION: Overall cognitive status: Within functional limits for tasks assessed     SENSATION: MUSCLE LENGTH: Hamstrings: WFL    LUMBAR SPECIAL TESTS:  Straight leg raise test: Negative  FUNCTIONAL TESTS:  Single leg - has lateral lean and needs UE assist due to knee pain and decreased mobility  GAIT:  Comments: lateral weight shift, flexed trunk, decreased knee ext and no heel strike  POSTURE: increased lumbar lordosis, anterior pelvic tilt, flexed trunk , weight shift right, weight shift left, and no heel strike knee flexed to about 10 deg  PELVIC ALIGNMENT:  LUMBARAROM/PROM:  A/PROM A/PROM  eval  Flexion   Extension   Right lateral flexion   Left lateral flexion   Right rotation   Left rotation    (Blank rows = not tested)  LOWER EXTREMITY ROM:  passive ROM Right eval Left eval  Hip flexion 75% 50%  Hip extension 50% 50%  Hip abduction    Hip adduction    Hip internal rotation 50% 50%  Hip external rotation 50% 50%  Knee flexion 60 pain 60 pain  Knee extension -10 pain -10 pain  Ankle dorsiflexion    Ankle plantarflexion    Ankle inversion    Ankle eversion     (Blank rows = not tested)  LOWER EXTREMITY MMT:  MMT Right eval Left eval  Hip flexion 4/5   Hip extension    Hip abduction 5/5 5/5  Hip adduction 5/5 5/5  Hip internal rotation    Hip external rotation    Knee flexion    Knee extension    Ankle dorsiflexion    Ankle plantarflexion    Ankle inversion    Ankle eversion     PALPATION:   General  adductors tight                External Perineal Exam tight adductors  attachment                             Internal Pelvic Floor levators stronger on the Rt  Patient  confirms identification and approves PT to assess internal pelvic floor and treatment Yes  PELVIC MMT:   MMT eval  Vaginal 3/5 x 3 reps; hold 5 then tone is increased very slow to relax after contracting  Internal Anal Sphincter   External Anal Sphincter   Puborectalis   Diastasis Recti   (Blank rows = not tested)        TONE: High Rt>Lt   PROLAPSE: no  TODAY'S TREATMENT:                                                                                                                              DATE: 11/22/22                   EVAL and self care: initial HEP and toileting urge techniques   PATIENT EDUCATION:  Education details: urge techniques, Access Code: [email or web address] Person educated: Patient Education method: Explanation, Demonstration, Verbal cues, and Handouts Education comprehension: verbalized understanding  HOME EXERCISE PROGRAM: Access Code: [email or web address] URL: [email or web address] Date: 11/22/2022 Prepared by: Jari Favre  Exercises - Diaphragmatic Breathing at 90/90 Supported  - 1 x daily - 7 x weekly - 3 sets - 10 reps - Supine Pelvic Tilt  - 1 x daily - 7 x weekly - 3 sets - 10 reps  ASSESSMENT:  CLINICAL IMPRESSION: Patient is a 62 y.o. female who was seen today for physical therapy evaluation and treatment for mixed incontinence of the bladder.  Pt is mostly having urge incontinence but also leaks when coughing a lot.  Pt has decreased endurance of pelvic floor due to high tone and impaired coordination as she is not relaxing after contracting.  Pt has good 3/5 MMT of the pelvic floor and can do 3 reps before diminished strength.  She also has a hard time holding contraction and cannot feel muscle tone after several reps.  Pt has decreased LE ROM and limited due to moderate to severe back and knee pain and hypomobility.  Pt will benefit from  skilled PT to address impairments.  Due to knee ROM and mobility and pain with standing and walking, pt may benefit from aquatic therapy as part of her treatment.  OBJECTIVE IMPAIRMENTS: decreased coordination, decreased endurance, difficulty walking, decreased ROM, decreased strength, increased muscle spasms, impaired flexibility, impaired tone, postural dysfunction, obesity, and pain.   ACTIVITY LIMITATIONS: standing and continence  PARTICIPATION LIMITATIONS: community activity  PERSONAL FACTORS: 3+ comorbidities: C-section, Lt knee surgery x2; Rt knee x4, appendectomy, GERD, PMH kidney stone, back pain  are also affecting patient's functional outcome.   REHAB POTENTIAL: Good  CLINICAL DECISION MAKING: Evolving/moderate complexity  EVALUATION COMPLEXITY: Moderate   GOALS: Goals reviewed with patient? Yes  SHORT TERM GOALS: Target date: 12/20/22  Ind with urge technique Baseline: Goal status: INITIAL  2.  Ind with initial HEP Baseline:  Goal status: INITIAL  3.  Report 20% less leakage in the morning Baseline:  Goal status: INITIAL  LONG TERM GOALS: Target date: 02/14/23  Pt will report 75% less urgency after standing up Baseline:  Goal status: INITIAL  2.  Pt will report no leaking when walking to the bathroom Baseline:  Goal status: INITIAL  3.  Pt will be independent with advanced HEP to maintain improvements made throughout therapy  Baseline:  Goal status: INITIAL  4.  Pt will report 25% reduced low back pain due to improved pelvic floor function and coordination. Baseline:  Goal status: INITIAL 5.  Pt will demonstrate improved standing posture due to strength and hip ROM for ability to stand at least 30 minutes at a time Baseline:  Goal status: INITIAL    PLAN:  PT FREQUENCY: 1-2x/week (possibly additional one per week for aquatic)  PT DURATION: 6 weeks  PLANNED INTERVENTIONS: Therapeutic exercises, Therapeutic activity, Neuromuscular re-education,  Balance training, Gait training, Patient/Family education, Self Care, Joint mobilization, Dry Needling, Electrical stimulation, Cryotherapy, Moist heat, Taping, Biofeedback, Manual therapy, and Re-evaluation  PLAN FOR NEXT SESSION: discuss dry needling and do adductors; pelvic tilt and circles supine, breathing and kegel with time to relax, ask about interest in aquatic therapy   Brayton Caves Cash Duce, PT 11/22/2022, 3:29 PM

## 2022-11-25 ENCOUNTER — Ambulatory Visit
Admission: RE | Admit: 2022-11-25 | Discharge: 2022-11-25 | Disposition: A | Discharge status: Home / Self Care | Payer: 59 | Source: Ambulatory Visit | Attending: Orthopaedic Surgery | Admitting: Orthopaedic Surgery

## 2022-11-25 DIAGNOSIS — M5416 Radiculopathy, lumbar region: Secondary | ICD-10-CM

## 2022-11-25 DIAGNOSIS — M48062 Spinal stenosis, lumbar region with neurogenic claudication: Secondary | ICD-10-CM

## 2022-12-13 ENCOUNTER — Other Ambulatory Visit: Payer: Self-pay | Admitting: Internal Medicine

## 2022-12-13 NOTE — Telephone Encounter (Signed)
Please refill as per office routine med refill policy (all routine meds to be refilled for 3 mo or monthly (per pt preference) up to one year from last visit, then month to month grace period for 3 mo, then further med refills will have to be denied)

## 2022-12-18 ENCOUNTER — Ambulatory Visit: Payer: 59 | Attending: Obstetrics and Gynecology

## 2022-12-18 DIAGNOSIS — M6281 Muscle weakness (generalized): Secondary | ICD-10-CM | POA: Diagnosis present

## 2022-12-18 DIAGNOSIS — M5459 Other low back pain: Secondary | ICD-10-CM | POA: Diagnosis present

## 2022-12-18 DIAGNOSIS — R252 Cramp and spasm: Secondary | ICD-10-CM | POA: Insufficient documentation

## 2022-12-18 DIAGNOSIS — R279 Unspecified lack of coordination: Secondary | ICD-10-CM | POA: Diagnosis present

## 2022-12-18 DIAGNOSIS — R293 Abnormal posture: Secondary | ICD-10-CM | POA: Insufficient documentation

## 2022-12-18 DIAGNOSIS — M62838 Other muscle spasm: Secondary | ICD-10-CM | POA: Insufficient documentation

## 2022-12-18 NOTE — Therapy (Unsigned)
OUTPATIENT PHYSICAL THERAPY FEMALE LUMBAR EVALUATION   Patient Name: [name] MRN: [id number] DOB:24-Jan-1961, 62 y.o., female Today's Date: 12/19/2022  END OF SESSION:  PT End of Session - 12/18/22 1623     Visit Number 2    Date for PT Re-Evaluation 02/14/23    Authorization Type [employer]    PT Start Time 0093    PT Stop Time 1700    PT Time Calculation (min) 50 min    Activity Tolerance Patient tolerated treatment well    Behavior During Therapy WFL for tasks assessed/performed             Past Medical History:  Diagnosis Date   ALLERGIC RHINITIS 10/14/2008   ANXIETY 10/14/2008   ASTHMA 10/14/2008   DEGENERATIVE JOINT DISEASE 10/14/2008   GERD 10/14/2008   HYPERLIPIDEMIA 10/14/2008   INTERMITTENT VERTIGO 11/03/2008   NEPHROLITHIASIS, HX OF 10/14/2008   Past Surgical History:  Procedure Laterality Date   APPENDECTOMY     CESAREAN SECTION     s/p left knee surgery     s/p left ulnar nerve surgery     x's 2   s/p nasal surgery     x's 2   s/p right knee surgery     x's 4- Cartilage torn   TONSILLECTOMY     Patient Active Problem List   Diagnosis Date Noted   Vitamin D deficiency 06/20/2022   Infected tick bite of abdominal wall 03/22/2021   Acute sinusitis 03/10/2020   Kidney stones 09/02/2019   Trapezoid ligament sprain 08/30/2019   Cellulitis of right breast 07/03/2018   Bilateral leg pain 05/01/2018   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   Primary osteoarthritis of both knees 04/23/2017   Chest pain 10/17/2016   Essential hypertension 10/17/2016   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   Macular degeneration 06/29/2016   Recurrent headache 06/29/2016   Hyperglycemia 06/15/2016   MRSA exposure 06/10/2016   Cervical disc disorder 05/11/2016   Acute bronchitis 11/03/2014   Wheezing 11/03/2014   Myalgia 10/06/2014   Fever in adult 10/06/2014   Muscle strain of right gluteal region 06/22/2014   Greater trochanteric bursitis of right hip 05/17/2014    Lateral epicondylitis of right elbow 06/24/2013   Chronic pain 10/13/2012   Vertigo 05/09/2012   Encounter for well adult exam with abnormal findings 11/11/2011   INTERMITTENT VERTIGO 11/03/2008   HLD (hyperlipidemia) 10/14/2008   Anxiety state 10/14/2008   Allergic rhinitis 10/14/2008   Asthma 10/14/2008   GERD 10/14/2008   NEPHROLITHIASIS, HX OF 10/14/2008    PCP: Biagio Borg, MD  REFERRING PROVIDER:   Tyson Dense, MD   REFERRING DIAG: N39.3 (ICD-10-CM) - Stress incontinence (female) (female)   THERAPY DIAG:  Other low back pain  Muscle weakness (generalized)  Cramp and spasm  Abnormal posture  Rationale for Evaluation and Treatment: Rehabilitation  ONSET DATE: a couple of years  SUBJECTIVE:  SUBJECTIVE STATEMENT: Patient reports increasing low back pain in the past year.  She is dealing with bilateral knee end stage OA and needs surgery but she has to lose weight to meet safe criteria.  The knee issues are affecting her back as she is not able to use her legs to lift and bend down.  She is unable to stand for more than 2-3 minutes before needing to sit.  She must lean on her grocery cart to do her grocery shopping and is unable to do her usual amount of grocery shopping due to her limited standing time.  She has also been evaluated by pelvic floor PT and will be attending sessions of PT for this as well.   Fluid intake: Yes: 2 bottles of water and pepsi and/or tea with meals    PAIN:  Are you having pain? No 12/18/22: Yes lumbar and right leg 8/10 when standing for more than 2-3 min  PRECAUTIONS: None  WEIGHT BEARING RESTRICTIONS: No  FALLS:  Has patient fallen in last 6 months? No  LIVING ENVIRONMENT: Lives with: lives with their family and daughter and her  husband Lives in: House/apartment   OCCUPATION: retired  PLOF: Independent  PATIENT GOALS: not have extreme urgency and leakage managed           12/18/22: to be able to control back pain and be able to exercise so that she can lose weight in preparation for her knee surgery  PERTINENT HISTORY:  C-section, Lt knee surgery x2; Rt knee x4, appendectomy, GERD, PMH kidney stone, back pain Sexual abuse: No  BOWEL MOVEMENT: Pain with bowel movement: No Type of bowel movement:Type (Bristol Stool Scale) loose recently, Frequency every other day, and Strain Yes Fully empty rectum: Yes:   Leakage: No Pads: No Fiber supplement: No  URINATION: Pain with urination: No Fully empty bladder: Yes:   Stream: Strong Urgency: Yes:   Frequency: 3 hours Leakage: Urge to void and Coughing Pads: No   INTERCOURSE: Pain with intercourse: Not currently active  PREGNANCY: C-section deliveries 1   PROLAPSE: None   OBJECTIVE:   DIAGNOSTIC FINDINGS: MRI 12/05/22 IMPRESSION: 1. Large partially calcified inferiorly migrated disc extrusion at L1-L2 resulting in moderate to severe spinal canal stenosis with right subarticular zone effacement and likely impingement of the traversing right L2 nerve root, also present on the CT abdomen/pelvis from 2019. 2. Advanced facet arthropathy at L3-L4 and L4-L5 with associated effusions which may reflect a source of pain. 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 3. Disc protrusions and facet arthropathy at T11-T12 and T12-L1 resulting in mild-to-moderate spinal canal stenosis at T11-T12 and left subarticular zone narrowing without evidence of frank nerve root impingement at T12-L1.   PATIENT SURVEYS:  FOTO: (next visit) PFIQ-7   COGNITION: Overall cognitive status: Within  functional limits for tasks assessed     SENSATION: MUSCLE LENGTH: Hamstrings: WFL but limited to approx 50-60 degrees bilaterally which would affect Lumbar spine Positive Thomas test bilaterally with significant lack of knee extension   LUMBAR SPECIAL TESTS:  Straight leg raise test: Negative  FUNCTIONAL TESTS:  Single leg - has lateral lean and needs UE assist due to knee pain and decreased mobility 5 times sit to stand: TUG:   GAIT:  Comments: lateral weight shift, flexed trunk, decreased knee ext and no heel strike 12/18/22: antalgic, waddle gait  and same as above  POSTURE: increased lumbar lordosis, anterior pelvic tilt, flexed trunk , weight shift right, weight shift left, and no heel strike knee flexed to about 10 deg  PELVIC ALIGNMENT:  LUMBARAROM/PROM:   All WNL with pain in extension  LOWER EXTREMITY ROM:  passive ROM Right eval Left eval  Hip flexion 75% 50%  Hip extension 50% 50%  Hip abduction    Hip adduction    Hip internal rotation 50% 50%  Hip external rotation 50% 50%  Knee flexion 60 pain 60 pain  Knee extension -10 pain -10 pain  Ankle dorsiflexion    Ankle plantarflexion    Ankle inversion    Ankle eversion     (Blank rows = not tested)  LOWER EXTREMITY MMT:  MMT Right eval Left eval  Hip flexion 4/5   Hip extension    Hip abduction 5/5 5/5  Hip adduction 5/5 5/5  Hip internal rotation    Hip external rotation    Knee flexion    Knee extension    Ankle dorsiflexion    Ankle plantarflexion    Ankle inversion    Ankle eversion     PALPATION:   General  adductors tight                External Perineal Exam tight adductors attachment                             Internal Pelvic Floor levators stronger on the Rt  Patient confirms identification and approves PT to assess internal pelvic floor and treatment Yes  PELVIC MMT:   MMT eval  Vaginal 3/5 x 3 reps; hold 5 then tone is increased very slow to relax after contracting   Internal Anal Sphincter   External Anal Sphincter   Puborectalis   Diastasis Recti   (Blank rows = not tested)        TONE: High Rt>Lt   PROLAPSE: no  TODAY'S TREATMENT:                                                                                                                              DATE: 12/18/22/24: Re-evaluation to include back pain Initiated HEP  DATE: 11/22/22                   EVAL and self care: initial HEP and toileting urge techniques   PATIENT EDUCATION:  Education details: urge techniques, Access Code: [email or web address] Person educated: Patient Education method: Explanation, Demonstration, Verbal cues, and Handouts Education comprehension: verbalized understanding  HOME EXERCISE PROGRAM: Access Code: [email or web address] (ortho Access Code: [email or web address]) URL: [email or web address] Date: 12/18/2022 Prepared by: Mikey Kirschner  Exercises - Standing Hamstring Stretch on Chair  - 1 x daily - 7 x weekly - 1 sets - 3 reps - 30 sec hold - Seated Hamstring Stretch  - 1 x daily - 7 x weekly - 1  sets - 3 reps - 30sec hold - Theatre manager with Chair and Counter Support  - 1 x daily - 7 x weekly - 1 sets - 3 reps - 30 sec hold URL: [email or web address] Date: 11/22/2022 Prepared by: Dwana Curd  Exercises - Diaphragmatic Breathing at 90/90 Supported  - 1 x daily - 7 x weekly - 3 sets - 10 reps - Supine Pelvic Tilt  - 1 x daily - 7 x weekly - 3 sets - 10 reps  ASSESSMENT:  CLINICAL IMPRESSION: Patient is a 62 y.o. female who was seen for physical therapy evaluation and treatment for mixed incontinence of the bladder and is now being seen for lumbar spine and leg pain.  She presents with excessive lumbar flexion on fwd bending with tight hamstrings, she has pain with standing more than 2-3 minutes indicating stenosis as seen on MRI.  She has right leg pain as well which likely from the encroachment on the L2 nerve root, also noted on MRI. She  is struggling with controlling her weight due to her knee and back pain which impedes her ability to exercise.  She would benefit from skilled PT to address flexibility and poor core strength along with her treatment for pelvic floor weakness.  Due to knee ROM and mobility and pain with standing and walking, pt may benefit from aquatic therapy as part of her treatment. She would like to see what she can accomplish on land first but will consider this as part of her plan of care.    OBJECTIVE IMPAIRMENTS: decreased coordination, decreased endurance, difficulty walking, decreased ROM, decreased strength, increased muscle spasms, impaired flexibility, impaired tone, postural dysfunction, obesity, and pain.   ACTIVITY LIMITATIONS: standing and continence  PARTICIPATION LIMITATIONS: community activity  PERSONAL FACTORS: 3+ comorbidities: C-section, Lt knee surgery x2; Rt knee x4, appendectomy, GERD, PMH kidney stone, back pain  are also affecting patient's functional outcome.   REHAB POTENTIAL: Good  CLINICAL DECISION MAKING: Evolving/moderate complexity  EVALUATION COMPLEXITY: Moderate   GOALS: Goals reviewed with patient? Yes  SHORT TERM GOALS: Target date: 12/20/22  Ind with urge technique Baseline: Goal status: INITIAL  2.  Ind with initial HEP Baseline:  Goal status: INITIAL  3.  Report 20% less leakage in the morning Baseline:  Goal status: INITIAL  4. Patient to report pain in low back no greater than 4/10  5. Patient to be able to stand for at least 5 min    LONG TERM GOALS: Target date: 02/14/23  Pt will report 75% less urgency after standing up Baseline:  Goal status: INITIAL  2.  Pt will report no leaking when walking to the bathroom Baseline:  Goal status: INITIAL  3.  Pt will be independent with advanced HEP to maintain improvements made throughout therapy  Baseline:  Goal status: INITIAL  4.  Pt will report 25% reduced low back pain due to improved pelvic  floor function and coordination. Baseline:  Goal status: INITIAL 5.  Pt will demonstrate improved standing posture due to strength and hip ROM for ability to stand at least 30 minutes at a time Baseline:  Goal status: INITIAL 6. Patient to report low back and leg pain at no greater than 2/10   Goal status: INITIAL  7. Patient to improve on FOTO to predicted outcome   Goal status: INITIAL  8. Patient to improve TUG and 5 times sit to stand by 2-3 seconds   Goal status: INITIAL  9. Patient to report 85% improvement in  overall symptoms    Goal status: INITIAL   PLAN:  PT FREQUENCY: 1-2x/week (possibly additional one per week for aquatic)  PT DURATION: 6 weeks  PLANNED INTERVENTIONS: Therapeutic exercises, Therapeutic activity, Neuromuscular re-education, Balance training, Gait training, Patient/Family education, Self Care, Joint mobilization, Dry Needling, Electrical stimulation, Cryotherapy, Moist heat, Taping, Biofeedback, Manual therapy, and Re-evaluation  PLAN FOR NEXT SESSION: FOTO, 5 times sit to stand and TUG next ortho visit.  Discuss dry needling and do adductors; pelvic tilt and circles supine, breathing and kegel with time to relax, ask about interest in aquatic therapy   Ichelle Harral B. Bethanie Bloxom, PT 12/19/22 10:11 AM  [facility] [address], [place], [place] [address] Phone # [phone] Fax [phone]

## 2022-12-19 NOTE — Addendum Note (Signed)
Addended by: Candyce Churn B on: 12/19/2022 05:10 PM   Modules accepted: Orders

## 2022-12-24 ENCOUNTER — Encounter: Payer: Self-pay | Admitting: Rehabilitative and Restorative Service Providers"

## 2022-12-24 ENCOUNTER — Ambulatory Visit: Payer: 59 | Admitting: Rehabilitative and Restorative Service Providers"

## 2022-12-24 DIAGNOSIS — M6281 Muscle weakness (generalized): Secondary | ICD-10-CM

## 2022-12-24 DIAGNOSIS — R252 Cramp and spasm: Secondary | ICD-10-CM

## 2022-12-24 DIAGNOSIS — M5459 Other low back pain: Secondary | ICD-10-CM

## 2022-12-24 DIAGNOSIS — R293 Abnormal posture: Secondary | ICD-10-CM

## 2022-12-24 NOTE — Therapy (Signed)
OUTPATIENT PHYSICAL THERAPY TREATMENT NOTE   Patient Name: MODEST DUBICKI MRN: [id number] DOB:Oct 13, 1961, 62 y.o., female Today's Date: 12/24/2022  END OF SESSION:  PT End of Session - 12/24/22 0806     Visit Number 3    Date for PT Re-Evaluation 02/14/23    Authorization Type [employer]    PT Start Time 0800    PT Stop Time 0840    PT Time Calculation (min) 40 min    Activity Tolerance Patient tolerated treatment well    Behavior During Therapy WFL for tasks assessed/performed             Past Medical History:  Diagnosis Date   ALLERGIC RHINITIS 10/14/2008   ANXIETY 10/14/2008   ASTHMA 10/14/2008   DEGENERATIVE JOINT DISEASE 10/14/2008   GERD 10/14/2008   HYPERLIPIDEMIA 10/14/2008   INTERMITTENT VERTIGO 11/03/2008   NEPHROLITHIASIS, HX OF 10/14/2008   Past Surgical History:  Procedure Laterality Date   APPENDECTOMY     CESAREAN SECTION     s/p left knee surgery     s/p left ulnar nerve surgery     x's 2   s/p nasal surgery     x's 2   s/p right knee surgery     x's 4- Cartilage torn   TONSILLECTOMY     Patient Active Problem List   Diagnosis Date Noted   Vitamin D deficiency 06/20/2022   Infected tick bite of abdominal wall 03/22/2021   Acute sinusitis 03/10/2020   Kidney stones 09/02/2019   Trapezoid ligament sprain 08/30/2019   Cellulitis of right breast 07/03/2018   Bilateral leg pain 05/01/2018   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   Primary osteoarthritis of both knees 04/23/2017   Chest pain 10/17/2016   Essential hypertension 10/17/2016   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   Macular degeneration 06/29/2016   Recurrent headache 06/29/2016   Hyperglycemia 06/15/2016   MRSA exposure 06/10/2016   Cervical disc disorder 05/11/2016   Acute bronchitis 11/03/2014   Wheezing 11/03/2014   Myalgia 10/06/2014   Fever in adult 10/06/2014   Muscle strain of right gluteal region 06/22/2014   Greater trochanteric bursitis of right hip 05/17/2014   Lateral  epicondylitis of right elbow 06/24/2013   Chronic pain 10/13/2012   Vertigo 05/09/2012   Encounter for well adult exam with abnormal findings 11/11/2011   INTERMITTENT VERTIGO 11/03/2008   HLD (hyperlipidemia) 10/14/2008   Anxiety state 10/14/2008   Allergic rhinitis 10/14/2008   Asthma 10/14/2008   GERD 10/14/2008   NEPHROLITHIASIS, HX OF 10/14/2008    PCP: Biagio Borg, MD  REFERRING PROVIDER:   Tyson Dense, MD   REFERRING DIAG: N39.3 (ICD-10-CM) - Stress incontinence (female) (female)   THERAPY DIAG:  Other low back pain  Muscle weakness (generalized)  Cramp and spasm  Abnormal posture  Rationale for Evaluation and Treatment: Rehabilitation  ONSET DATE: a couple of years  SUBJECTIVE:  SUBJECTIVE STATEMENT: Patient reports that she is having some difficulty with some of the exercises.  States that she is having some back and knee pain.  Fluid intake: Yes: 2 bottles of water and pepsi and/or tea with meals    PAIN:  PAIN:  Are you having pain? Yes NPRS scale: 8/10 Pain location: low back and knees Pain description: sharp and aching  Aggravating factors: certain movements/activities Relieving factors: medication, rest, massage    PRECAUTIONS: None  WEIGHT BEARING RESTRICTIONS: No  FALLS:  Has patient fallen in last 6 months? No  LIVING ENVIRONMENT: Lives with: lives with their family and daughter and her husband Lives in: House/apartment   OCCUPATION: retired  PLOF: Independent  PATIENT GOALS: not have extreme urgency and leakage managed           12/18/22: to be able to control back pain and be able to exercise so that she can lose weight in preparation for her knee surgery  PERTINENT HISTORY:  C-section, Lt knee surgery x2; Rt knee x4, appendectomy, GERD,  PMH kidney stone, back pain Sexual abuse: No  BOWEL MOVEMENT: Pain with bowel movement: No Type of bowel movement:Type (Bristol Stool Scale) loose recently, Frequency every other day, and Strain Yes Fully empty rectum: Yes:   Leakage: No Pads: No Fiber supplement: No  URINATION: Pain with urination: No Fully empty bladder: Yes:   Stream: Strong Urgency: Yes:   Frequency: 3 hours Leakage: Urge to void and Coughing Pads: No   INTERCOURSE: Pain with intercourse: Not currently active  PREGNANCY: C-section deliveries 1   PROLAPSE: None   OBJECTIVE:   DIAGNOSTIC FINDINGS: MRI 12/05/22 IMPRESSION: 1. Large partially calcified inferiorly migrated disc extrusion at L1-L2 resulting in moderate to severe spinal canal stenosis with right subarticular zone effacement and likely impingement of the traversing right L2 nerve root, also present on the CT abdomen/pelvis from 2019. 2. Advanced facet arthropathy at L3-L4 and L4-L5 with associated effusions which may reflect a source of pain. 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 3. Disc protrusions and facet arthropathy at T11-T12 and T12-L1 resulting in mild-to-moderate spinal canal stenosis at T11-T12 and left subarticular zone narrowing without evidence of frank nerve root impingement at T12-L1.   PATIENT SURVEYS:  12/24/2022:  FOTO 53% (projected 62% by visit 11)  COGNITION: Overall cognitive status: Within functional limits for tasks assessed     SENSATION: MUSCLE LENGTH: Hamstrings: WFL but limited to approx 50-60 degrees bilaterally which would affect Lumbar spine Positive Thomas test bilaterally with significant lack of knee extension   LUMBAR SPECIAL TESTS:  Straight leg raise test: Negative  FUNCTIONAL TESTS:  Eval: Single leg - has  lateral lean and needs UE assist due to knee pain and decreased mobility  12/24/2022: 5 times sit to stand:  28.55 sec with UE use required TUG: 14.6 sec  GAIT:  Comments: lateral weight shift, flexed trunk, decreased knee ext and no heel strike 12/18/22: antalgic, waddle gait and same as above  POSTURE: increased lumbar lordosis, anterior pelvic tilt, flexed trunk , weight shift right, weight shift left, and no heel strike knee flexed to about 10 deg  PELVIC ALIGNMENT:  LUMBARAROM/PROM:   All WNL with pain in extension  LOWER EXTREMITY ROM:  passive ROM Right eval Left eval  Hip flexion 75% 50%  Hip extension 50% 50%  Hip abduction  Hip adduction    Hip internal rotation 50% 50%  Hip external rotation 50% 50%  Knee flexion 60 pain 60 pain  Knee extension -10 pain -10 pain  Ankle dorsiflexion    Ankle plantarflexion    Ankle inversion    Ankle eversion     (Blank rows = not tested)  LOWER EXTREMITY MMT:  MMT Right eval Left eval  Hip flexion 4/5   Hip extension    Hip abduction 5/5 5/5  Hip adduction 5/5 5/5  Hip internal rotation    Hip external rotation    Knee flexion    Knee extension    Ankle dorsiflexion    Ankle plantarflexion    Ankle inversion    Ankle eversion     PALPATION:   General  adductors tight                External Perineal Exam tight adductors attachment                             Internal Pelvic Floor levators stronger on the Rt  Patient confirms identification and approves PT to assess internal pelvic floor and treatment Yes  PELVIC MMT:   MMT eval  Vaginal 3/5 x 3 reps; hold 5 then tone is increased very slow to relax after contracting  Internal Anal Sphincter   External Anal Sphincter   Puborectalis   Diastasis Recti   (Blank rows = not tested)        TONE: High Rt>Lt   PROLAPSE: no  TODAY'S TREATMENT:                                                                                                                                DATE: 12/24/2022 Nustep level 3 x6 min with PT present to discuss status FOTO, 5 times sit to stand, TUG Supine posterior pelvic tilts 2x10 Supine lower trunk rotation 2x5 Supine ab sets 2x10 Supine bent knee fallouts 10x5 sec Prone hamstring curl x10 bilat Manual Therapy in prone:  Addaday to bilateral lumbar multifidi, glutes/piriformis, hamstrings, and IT band   DATE: 12/18/22/24: Re-evaluation to include back pain Initiated HEP  DATE: 11/22/22                   EVAL and self care: initial HEP and toileting urge techniques   PATIENT EDUCATION:  Education details: urge techniques, Access Code: [email or web address] Person educated: Patient Education method: Explanation, Demonstration, Verbal cues, and Handouts Education comprehension: verbalized understanding  HOME EXERCISE PROGRAM: Access Code: [email or web address] URL: [email or web address] Date: 12/24/2022 Prepared by: Shelby Dubin Shivonne Schwartzman  Exercises - Seated Hamstring Stretch  - 1 x daily - 7 x weekly - 1 sets - 2 reps - 20 sec hold - Standing Hamstring Stretch on Chair  - 1 x daily - 7 x weekly - 1 sets - 2 reps - 20 sec hold - Diaphragmatic Breathing at  90/90 Supported  - 1 x daily - 7 x weekly - 3 sets - 10 reps - Supine Pelvic Tilt  - 1 x daily - 7 x weekly - 3 sets - 10 reps - Supine Transversus Abdominis Bracing - Hands on Stomach  - 1 x daily - 7 x weekly - 2 sets - 10 reps - Supine Lower Trunk Rotation  - 1 x daily - 7 x weekly - 2 sets - 5 reps - Bent Knee Fallouts  - 1 x daily - 7 x weekly - 2 sets - 5 reps - 5 sec hold - Prone Knee Flexion  - 1 x daily - 7 x weekly - 2 sets - 10 reps  ASSESSMENT:  CLINICAL IMPRESSION: Ms Tallerico presents to skilled with complaints of being unable to performing standing quad stretch exercise provided last visit.  Patient was able to achieve prone position, so added prone hamstring curl for a gentle quad and hip flexor stretch secondary to knee OA limiting standing stretch.   Patient able to progress with exercises with minimal cuing for technique.  Patient did report a relief of pain with using Addaday at end of session with pain decreasing in her back to a 6/10.  Patient continues to require skilled PT and hopes to have one visit a week of ortho and one for pelvic moving forward.  OBJECTIVE IMPAIRMENTS: decreased coordination, decreased endurance, difficulty walking, decreased ROM, decreased strength, increased muscle spasms, impaired flexibility, impaired tone, postural dysfunction, obesity, and pain.   ACTIVITY LIMITATIONS: standing and continence  PARTICIPATION LIMITATIONS: community activity  PERSONAL FACTORS: 3+ comorbidities: C-section, Lt knee surgery x2; Rt knee x4, appendectomy, GERD, PMH kidney stone, back pain  are also affecting patient's functional outcome.   REHAB POTENTIAL: Good  CLINICAL DECISION MAKING: Evolving/moderate complexity  EVALUATION COMPLEXITY: Moderate   GOALS: Goals reviewed with patient? Yes  SHORT TERM GOALS: Target date: 12/20/22  Ind with urge technique Baseline: Goal status: INITIAL  2.  Ind with initial HEP Baseline:  Goal status: MET on 12/24/2022  3.  Report 20% less leakage in the morning Baseline:  Goal status: INITIAL  4. Patient to report pain in low back no greater than 4/10  5. Patient to be able to stand for at least 5 min    LONG TERM GOALS: Target date: 02/14/23  Pt will report 75% less urgency after standing up Baseline:  Goal status: INITIAL  2.  Pt will report no leaking when walking to the bathroom Baseline:  Goal status: INITIAL  3.  Pt will be independent with advanced HEP to maintain improvements made throughout therapy  Baseline:  Goal status: INITIAL  4.  Pt will report 25% reduced low back pain due to improved pelvic floor function and coordination. Baseline:  Goal status: INITIAL 5.  Pt will demonstrate improved standing posture due to strength and hip ROM for ability to stand  at least 30 minutes at a time Baseline:  Goal status: INITIAL 6. Patient to report low back and leg pain at no greater than 2/10   Goal status: INITIAL  7. Patient to improve on FOTO to predicted outcome   Goal status: INITIAL  8. Patient to improve TUG and 5 times sit to stand by 2-3 seconds   Goal status: INITIAL  9. Patient to report 85% improvement in overall symptoms    Goal status: INITIAL   PLAN:  PT FREQUENCY: 1-2x/week   PT DURATION: 6 weeks  PLANNED INTERVENTIONS: Therapeutic exercises, Therapeutic  activity, Neuromuscular re-education, Balance training, Gait training, Patient/Family education, Self Care, Joint mobilization, Dry Needling, Electrical stimulation, Cryotherapy, Moist heat, Taping, Biofeedback, Manual therapy, and Re-evaluation  PLAN FOR NEXT SESSION: Discuss dry needling and do adductors; pelvic tilt and circles supine, breathing and kegel with time to relax, ask about interest in aquatic therapy   Juel Burrow, PT 12/24/22 9:04 AM   [facility] [address] [place], [place] [address] Phone # [phone] Fax [phone]

## 2022-12-26 ENCOUNTER — Ambulatory Visit: Payer: 59 | Admitting: Physical Therapy

## 2022-12-26 ENCOUNTER — Encounter: Payer: Self-pay | Admitting: Physical Therapy

## 2022-12-26 DIAGNOSIS — M6281 Muscle weakness (generalized): Secondary | ICD-10-CM

## 2022-12-26 DIAGNOSIS — M5459 Other low back pain: Secondary | ICD-10-CM | POA: Diagnosis not present

## 2022-12-26 DIAGNOSIS — R293 Abnormal posture: Secondary | ICD-10-CM

## 2022-12-26 DIAGNOSIS — R252 Cramp and spasm: Secondary | ICD-10-CM

## 2022-12-26 NOTE — Therapy (Signed)
OUTPATIENT PHYSICAL THERAPY TREATMENT NOTE   Patient Name: [name] MRN: [id number] DOB:[birth year], 62 y.o., female Today's Date: 12/26/2022  END OF SESSION:  PT End of Session - 12/26/22 0847     Visit Number 4    Date for PT Re-Evaluation 02/14/23    Authorization Type [employer]    PT Start Time 0845    PT Stop Time 0930    PT Time Calculation (min) 45 min    Activity Tolerance Patient tolerated treatment well    Behavior During Therapy WFL for tasks assessed/performed              Past Medical History:  Diagnosis Date   ALLERGIC RHINITIS 10/14/2008   ANXIETY 10/14/2008   ASTHMA 10/14/2008   DEGENERATIVE JOINT DISEASE 10/14/2008   GERD 10/14/2008   HYPERLIPIDEMIA 10/14/2008   INTERMITTENT VERTIGO 11/03/2008   NEPHROLITHIASIS, HX OF 10/14/2008   Past Surgical History:  Procedure Laterality Date   APPENDECTOMY     CESAREAN SECTION     s/p left knee surgery     s/p left ulnar nerve surgery     x's 2   s/p nasal surgery     x's 2   s/p right knee surgery     x's 4- Cartilage torn   TONSILLECTOMY     Patient Active Problem List   Diagnosis Date Noted   Vitamin D deficiency 06/20/2022   Infected tick bite of abdominal wall 03/22/2021   Acute sinusitis 03/10/2020   Kidney stones 09/02/2019   Trapezoid ligament sprain 08/30/2019   Cellulitis of right breast 07/03/2018   Bilateral leg pain 05/01/2018   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   Primary osteoarthritis of both knees 04/23/2017   Chest pain 10/17/2016   Essential hypertension 10/17/2016   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   Macular degeneration 06/29/2016   Recurrent headache 06/29/2016   Hyperglycemia 06/15/2016   MRSA exposure 06/10/2016   Cervical disc disorder 05/11/2016   Acute bronchitis 11/03/2014   Wheezing 11/03/2014   Myalgia 10/06/2014   Fever in adult 10/06/2014   Muscle strain of right gluteal region 06/22/2014   Greater trochanteric bursitis of right hip 05/17/2014   Lateral  epicondylitis of right elbow 06/24/2013   Chronic pain 10/13/2012   Vertigo 05/09/2012   Encounter for well adult exam with abnormal findings 11/11/2011   INTERMITTENT VERTIGO 11/03/2008   HLD (hyperlipidemia) 10/14/2008   Anxiety state 10/14/2008   Allergic rhinitis 10/14/2008   Asthma 10/14/2008   GERD 10/14/2008   NEPHROLITHIASIS, HX OF 10/14/2008    PCP: Biagio Borg, MD  REFERRING PROVIDER:   Tyson Dense, MD   REFERRING DIAG: N39.3 (ICD-10-CM) - Stress incontinence (female) (female)   THERAPY DIAG:  Other low back pain  Muscle weakness (generalized)  Cramp and spasm  Abnormal posture  Rationale for Evaluation and Treatment: Rehabilitation  ONSET DATE: a couple of years  SUBJECTIVE:  SUBJECTIVE STATEMENT: Last session felt good while I did it but as soon as I got home pain started back up again.  Standing activities around the house are the worst for me.  Fluid intake: Yes: 2 bottles of water and pepsi and/or tea with meals    PAIN:  PAIN:  Are you having pain? Yes NPRS scale: 6/10 Pain location: low back and knees Pain description: sharp and aching  Aggravating factors: certain movements/activities Relieving factors: medication, rest, massage    PRECAUTIONS: None  WEIGHT BEARING RESTRICTIONS: No  FALLS:  Has patient fallen in last 6 months? No  LIVING ENVIRONMENT: Lives with: lives with their family and daughter and her husband Lives in: House/apartment   OCCUPATION: retired  PLOF: Independent  PATIENT GOALS: not have extreme urgency and leakage managed           12/18/22: to be able to control back pain and be able to exercise so that she can lose weight in preparation for her knee surgery  PERTINENT HISTORY:  C-section, Lt knee surgery x2; Rt knee x4,  appendectomy, GERD, PMH kidney stone, back pain Sexual abuse: No  BOWEL MOVEMENT: Pain with bowel movement: No Type of bowel movement:Type (Bristol Stool Scale) loose recently, Frequency every other day, and Strain Yes Fully empty rectum: Yes:   Leakage: No Pads: No Fiber supplement: No  URINATION: Pain with urination: No Fully empty bladder: Yes:   Stream: Strong Urgency: Yes:   Frequency: 3 hours Leakage: Urge to void and Coughing Pads: No   INTERCOURSE: Pain with intercourse: Not currently active  PREGNANCY: C-section deliveries 1   PROLAPSE: None   OBJECTIVE:   DIAGNOSTIC FINDINGS: MRI 12/05/22 IMPRESSION: 1. Large partially calcified inferiorly migrated disc extrusion at L1-L2 resulting in moderate to severe spinal canal stenosis with right subarticular zone effacement and likely impingement of the traversing right L2 nerve root, also present on the CT abdomen/pelvis from 2019. 2. Advanced facet arthropathy at L3-L4 and L4-L5 with associated effusions which may reflect a source of pain. 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 3. Disc protrusions and facet arthropathy at T11-T12 and T12-L1 resulting in mild-to-moderate spinal canal stenosis at T11-T12 and left subarticular zone narrowing without evidence of frank nerve root impingement at T12-L1.   PATIENT SURVEYS:  12/24/2022:  FOTO 53% (projected 62% by visit 11)  COGNITION: Overall cognitive status: Within functional limits for tasks assessed     SENSATION: MUSCLE LENGTH: Hamstrings: WFL but limited to approx 50-60 degrees bilaterally which would affect Lumbar spine Positive Thomas test bilaterally with significant lack of knee extension   LUMBAR SPECIAL TESTS:  Straight leg raise test: Negative  FUNCTIONAL TESTS:   Eval: Single leg - has lateral lean and needs UE assist due to knee pain and decreased mobility  12/24/2022: 5 times sit to stand:  28.55 sec with UE use required TUG: 14.6 sec  GAIT:  Comments: lateral weight shift, flexed trunk, decreased knee ext and no heel strike 12/18/22: antalgic, waddle gait and same as above  POSTURE: increased lumbar lordosis, anterior pelvic tilt, flexed trunk , weight shift right, weight shift left, and no heel strike knee flexed to about 10 deg  PELVIC ALIGNMENT:  LUMBARAROM/PROM:   All WNL with pain in extension  LOWER EXTREMITY ROM:  passive ROM Right eval Left eval  Hip flexion 75% 50%  Hip  extension 50% 50%  Hip abduction    Hip adduction    Hip internal rotation 50% 50%  Hip external rotation 50% 50%  Knee flexion 60 pain 60 pain  Knee extension -10 pain -10 pain  Ankle dorsiflexion    Ankle plantarflexion    Ankle inversion    Ankle eversion     (Blank rows = not tested)  LOWER EXTREMITY MMT:  MMT Right eval Left eval  Hip flexion 4/5   Hip extension    Hip abduction 5/5 5/5  Hip adduction 5/5 5/5  Hip internal rotation    Hip external rotation    Knee flexion    Knee extension    Ankle dorsiflexion    Ankle plantarflexion    Ankle inversion    Ankle eversion     PALPATION:   General  adductors tight                External Perineal Exam tight adductors attachment                             Internal Pelvic Floor levators stronger on the Rt  Patient confirms identification and approves PT to assess internal pelvic floor and treatment Yes  PELVIC MMT:   MMT eval  Vaginal 3/5 x 3 reps; hold 5 then tone is increased very slow to relax after contracting  Internal Anal Sphincter   External Anal Sphincter   Puborectalis   Diastasis Recti   (Blank rows = not tested)        TONE: High Rt>Lt   PROLAPSE: no  TODAY'S TREATMENT:                                                                                                                               DATE: 12/26/22 NuStep L3 x 6' PT present to discuss status and plan for session Supine PPT with VC for efforts to draw in abs and engage PF, exhale on tilt and indraw - Pt has great difficulty coordinating Lower trunk rotation rocking knees side to side x 20 Supine 1lb bil UE raise x10 with ab indraw, knees on rounded bolster for support Supine 1lb d2 flexion x 10 each side with ab indraw, knees on rounded bolster for support Seated lumbar flexion and flexion with SB physioball rollouts x 10 each Seated lumbar flexion hang stretch 5x10" holds (HEP) Seated bil 1lb shoulder flexion to shoulder height with exhale on lift for TA indraw x 10 Manual Therapy in prone:  Addaday L1 to bilateral lumbar multifidi, glutes/piriformis, hamstrings, and IT band Neuro re-ed: signif ongoing cueing for visualization, exhale on pelvic lift and abdominal indraw within therex   DATE: 12/24/2022 Nustep level 3 x6 min with PT present to discuss status FOTO, 5 times sit to stand, TUG Supine posterior pelvic tilts 2x10 Supine lower trunk rotation 2x5 Supine ab sets 2x10 Supine bent knee fallouts  10x5 sec Prone hamstring curl x10 bilat Manual Therapy in prone:  Addaday to bilateral lumbar multifidi, glutes/piriformis, hamstrings, and IT band   DATE: 12/18/22/24: Re-evaluation to include back pain Initiated HEP  DATE: 11/22/22                   EVAL and self care: initial HEP and toileting urge techniques   PATIENT EDUCATION:  Education details: urge techniques, Access Code: [email or web address] Person educated: Patient Education method: Explanation, Demonstration, Verbal cues, and Handouts Education comprehension: verbalized understanding  HOME EXERCISE PROGRAM: Access Code: [email or web address] URL: [email or web address] Date: 12/26/2022 Prepared by: Venetia Night Seriyah Collison  Exercises - Seated Hamstring Stretch  - 1 x daily - 7 x weekly - 1 sets - 2 reps - 20 sec hold -  Standing Hamstring Stretch on Chair  - 1 x daily - 7 x weekly - 1 sets - 2 reps - 20 sec hold - Diaphragmatic Breathing at 90/90 Supported  - 1 x daily - 7 x weekly - 3 sets - 10 reps - Supine Pelvic Tilt  - 1 x daily - 7 x weekly - 3 sets - 10 reps - Supine Transversus Abdominis Bracing - Hands on Stomach  - 1 x daily - 7 x weekly - 2 sets - 10 reps - Supine Lower Trunk Rotation  - 1 x daily - 7 x weekly - 2 sets - 5 reps - Bent Knee Fallouts  - 1 x daily - 7 x weekly - 2 sets - 5 reps - 5 sec hold - Prone Knee Flexion  - 1 x daily - 7 x weekly - 2 sets - 10 reps - Seated Flexion Stretch  - 1 x daily - 7 x weekly - 1 sets - 5 reps - 10 hold  ASSESSMENT:  CLINICAL IMPRESSION: Pt with ongoing LBP pain limiting standing to several min.  She uses a stool to take seated breaks.  PT added seated lumbar flexion stretch for relief.  Pt reports she has been practicing pelvic floor contractions and urge drills which have improved her control.  She has trouble coordinating and accessing pelvic floor contractions with TA.  PT worked with VC and exhale with core contractions with overlay of UE 1lb weights to help highlight deep core.  Pt benefited from rounded bolster under knees in supine with therex to relieve knee pain allowing better focus on target therex.  Next visit ask Pt about interest in aquatic PT.  OBJECTIVE IMPAIRMENTS: decreased coordination, decreased endurance, difficulty walking, decreased ROM, decreased strength, increased muscle spasms, impaired flexibility, impaired tone, postural dysfunction, obesity, and pain.   ACTIVITY LIMITATIONS: standing and continence  PARTICIPATION LIMITATIONS: community activity  PERSONAL FACTORS: 3+ comorbidities: C-section, Lt knee surgery x2; Rt knee x4, appendectomy, GERD, PMH kidney stone, back pain  are also affecting patient's functional outcome.   REHAB POTENTIAL: Good  CLINICAL DECISION MAKING: Evolving/moderate complexity  EVALUATION COMPLEXITY:  Moderate   GOALS: Goals reviewed with patient? Yes  SHORT TERM GOALS: Target date: 12/20/22  Ind with urge technique Baseline: Goal status: ongoing  2.  Ind with initial HEP Baseline:  Goal status: MET on 12/24/2022  3.  Report 20% less leakage in the morning Baseline:  Goal status: INITIAL  4. Patient to report pain in low back no greater than 4/10  5. Patient to be able to stand for at least 5 min    LONG TERM GOALS: Target date: 02/14/23  Pt will report 75% less urgency after standing up Baseline:  Goal status: INITIAL  2.  Pt will report no leaking when walking to the bathroom Baseline:  Goal status: INITIAL  3.  Pt will be independent with advanced HEP to maintain improvements made throughout therapy  Baseline:  Goal status: INITIAL  4.  Pt will report 25% reduced low back pain due to improved pelvic floor function and coordination. Baseline:  Goal status: INITIAL 5.  Pt will demonstrate improved standing posture due to strength and hip ROM for ability to stand at least 30 minutes at a time Baseline:  Goal status: INITIAL 6. Patient to report low back and leg pain at no greater than 2/10   Goal status: INITIAL  7. Patient to improve on FOTO to predicted outcome   Goal status: INITIAL  8. Patient to improve TUG and 5 times sit to stand by 2-3 seconds   Goal status: INITIAL  9. Patient to report 85% improvement in overall symptoms    Goal status: INITIAL   PLAN:  PT FREQUENCY: 1-2x/week   PT DURATION: 6 weeks  PLANNED INTERVENTIONS: Therapeutic exercises, Therapeutic activity, Neuromuscular re-education, Balance training, Gait training, Patient/Family education, Self Care, Joint mobilization, Dry Needling, Electrical stimulation, Cryotherapy, Moist heat, Taping, Biofeedback, Manual therapy, and Re-evaluation  PLAN FOR NEXT SESSION: work on exhale with core/pelvic contractions, Discuss dry needling and do adductors; pelvic tilt and circles supine,  breathing and kegel with time to relax, ask about interest in aquatic therapy   Baruch Merl, PT 12/26/22 9:55 AM   [facility] [address], [address] [place], [place] [address] Phone # [phone] Fax [phone]

## 2022-12-31 NOTE — Therapy (Unsigned)
OUTPATIENT PHYSICAL THERAPY TREATMENT NOTE   Patient Name: [name] MRN: [id number] DOB:1961-09-07, 62 y.o., female Today's Date: 01/01/2023  END OF SESSION:  PT End of Session - 01/01/23 0752     Visit Number 5    Date for PT Re-Evaluation 02/14/23    Authorization Type [employer]    PT Start Time [id number]    PT Stop Time 0835    PT Time Calculation (min) 41 min    Activity Tolerance Patient tolerated treatment well    Behavior During Therapy [facility] for tasks assessed/performed               Past Medical History:  Diagnosis Date   ALLERGIC RHINITIS 10/14/2008   ANXIETY 10/14/2008   ASTHMA 10/14/2008   DEGENERATIVE JOINT DISEASE 10/14/2008   GERD 10/14/2008   HYPERLIPIDEMIA 10/14/2008   INTERMITTENT VERTIGO 11/03/2008   NEPHROLITHIASIS, HX OF 10/14/2008   Past Surgical History:  Procedure Laterality Date   APPENDECTOMY     CESAREAN SECTION     s/p left knee surgery     s/p left ulnar nerve surgery     x's 2   s/p nasal surgery     x's 2   s/p right knee surgery     x's 4- Cartilage torn   TONSILLECTOMY     Patient Active Problem List   Diagnosis Date Noted   Vitamin D deficiency 06/20/2022   Infected tick bite of abdominal wall 03/22/2021   Acute sinusitis 03/10/2020   Kidney stones 09/02/2019   Trapezoid ligament sprain 08/30/2019   Cellulitis of right breast 07/03/2018   Bilateral leg pain 05/01/2018   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   Primary osteoarthritis of both knees 04/23/2017   Chest pain 10/17/2016   Essential hypertension 10/17/2016   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   Macular degeneration 06/29/2016   Recurrent headache 06/29/2016   Hyperglycemia 06/15/2016   MRSA exposure 06/10/2016   Cervical disc disorder 05/11/2016   Acute bronchitis 11/03/2014   Wheezing 11/03/2014   Myalgia 10/06/2014   Fever in adult 10/06/2014   Muscle strain of right gluteal region 06/22/2014   Greater trochanteric bursitis of right hip 05/17/2014   Lateral  epicondylitis of right elbow 06/24/2013   Chronic pain 10/13/2012   Vertigo 05/09/2012   Encounter for well adult exam with abnormal findings 11/11/2011   INTERMITTENT VERTIGO 11/03/2008   HLD (hyperlipidemia) 10/14/2008   Anxiety state 10/14/2008   Allergic rhinitis 10/14/2008   Asthma 10/14/2008   GERD 10/14/2008   NEPHROLITHIASIS, HX OF 10/14/2008    PCP: Biagio Borg, MD  REFERRING PROVIDER:   Tyson Dense, MD   REFERRING DIAG: N39.3 (ICD-10-CM) - Stress incontinence (female) (female)   THERAPY DIAG:  Muscle weakness (generalized)  Cramp and spasm  Abnormal posture  Unspecified lack of coordination  Other low back pain  Rationale for Evaluation and Treatment: Rehabilitation  ONSET DATE: a couple of years  SUBJECTIVE:  SUBJECTIVE STATEMENT: Most of the stuff kills my knees. Standing is what hurts my back.  Fluid intake: Yes: 2 bottles of water and pepsi and/or tea with meals    PAIN:  PAIN:  Are you having pain? Yes NPRS scale: 6/10 Pain location: low back and knees Pain description: sharp and aching  Aggravating factors: certain movements/activities Relieving factors: medication, rest, massage    PRECAUTIONS: None  WEIGHT BEARING RESTRICTIONS: No  FALLS:  Has patient fallen in last 6 months? No  LIVING ENVIRONMENT: Lives with: lives with their family and daughter and her husband Lives in: House/apartment   OCCUPATION: retired  PLOF: Independent  PATIENT GOALS: not have extreme urgency and leakage managed           12/18/22: to be able to control back pain and be able to exercise so that she can lose weight in preparation for her knee surgery  PERTINENT HISTORY:  C-section, Lt knee surgery x2; Rt knee x4, appendectomy, GERD, PMH kidney stone, back  pain Sexual abuse: No  BOWEL MOVEMENT: Pain with bowel movement: No Type of bowel movement:Type (Bristol Stool Scale) loose recently, Frequency every other day, and Strain Yes Fully empty rectum: Yes:   Leakage: No Pads: No Fiber supplement: No  URINATION: Pain with urination: No Fully empty bladder: Yes:   Stream: Strong Urgency: Yes:   Frequency: 3 hours Leakage: Urge to void and Coughing Pads: No   INTERCOURSE: Pain with intercourse: Not currently active  PREGNANCY: C-section deliveries 1   PROLAPSE: None   OBJECTIVE:   DIAGNOSTIC FINDINGS: MRI 12/05/22 IMPRESSION: 1. Large partially calcified inferiorly migrated disc extrusion at L1-L2 resulting in moderate to severe spinal canal stenosis with right subarticular zone effacement and likely impingement of the traversing right L2 nerve root, also present on the CT abdomen/pelvis from 2019. 2. Advanced facet arthropathy at L3-L4 and L4-L5 with associated effusions which may reflect a source of pain. 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 3. Disc protrusions and facet arthropathy at T11-T12 and T12-L1 resulting in mild-to-moderate spinal canal stenosis at T11-T12 and left subarticular zone narrowing without evidence of frank nerve root impingement at T12-L1.   PATIENT SURVEYS:  12/24/2022:  FOTO 53% (projected 62% by visit 11)  COGNITION: Overall cognitive status: Within functional limits for tasks assessed     SENSATION: MUSCLE LENGTH: Hamstrings: WFL but limited to approx 50-60 degrees bilaterally which would affect Lumbar spine Positive Thomas test bilaterally with significant lack of knee extension   LUMBAR SPECIAL TESTS:  Straight leg raise test: Negative  FUNCTIONAL TESTS:  Eval: Single leg - has lateral lean and needs  UE assist due to knee pain and decreased mobility  12/24/2022: 5 times sit to stand:  28.55 sec with UE use required TUG: 14.6 sec  GAIT:  Comments: lateral weight shift, flexed trunk, decreased knee ext and no heel strike 12/18/22: antalgic, waddle gait and same as above  POSTURE: increased lumbar lordosis, anterior pelvic tilt, flexed trunk , weight shift right, weight shift left, and no heel strike knee flexed to about 10 deg  PELVIC ALIGNMENT:  LUMBARAROM/PROM:   All WNL with pain in extension  LOWER EXTREMITY ROM:  passive ROM Right eval Left eval  Hip flexion 75% 50%  Hip extension 50% 50%  Hip abduction    Hip adduction    Hip internal rotation 50%  50%  Hip external rotation 50% 50%  Knee flexion 60 pain 60 pain  Knee extension -10 pain -10 pain  Ankle dorsiflexion    Ankle plantarflexion    Ankle inversion    Ankle eversion     (Blank rows = not tested)  LOWER EXTREMITY MMT:  MMT Right eval Left eval  Hip flexion 4/5   Hip extension    Hip abduction 5/5 5/5  Hip adduction 5/5 5/5  Hip internal rotation    Hip external rotation    Knee flexion    Knee extension    Ankle dorsiflexion    Ankle plantarflexion    Ankle inversion    Ankle eversion     PALPATION:   General  adductors tight                External Perineal Exam tight adductors attachment                             Internal Pelvic Floor levators stronger on the Rt  Patient confirms identification and approves PT to assess internal pelvic floor and treatment Yes  PELVIC MMT:   MMT eval  Vaginal 3/5 x 3 reps; hold 5 then tone is increased very slow to relax after contracting  Internal Anal Sphincter   External Anal Sphincter   Puborectalis   Diastasis Recti   (Blank rows = not tested)        TONE: High Rt>Lt   PROLAPSE: no  TODAY'S TREATMENT:                                                                                                                              DATE:  01/01/23 NuStep L3 x 6' PT present to discuss status and plan for session  Neuro re-ed: ongoing cueing for visualization, exhale on  abdominal indraw within therex, TC on thoracic and lumbar paraspinals to stay relaxed with exhale and abdominal engaged, avoiding bulging abdomen Supine with head slightly elevated and LE on roll for knee in flexion - pball pressing down, pball lifting up, breathing with arms relaxed Seated pressing on pillows exhale with abdominal activation  Manual: Lumbar and thoraicc paraspinals STM and cupping Trigger Point Dry-Needling  Treatment instructions: Expect mild to moderate muscle soreness. S/S of pneumothorax if dry needled over a lung field, and to seek immediate medical attention should they occur. Patient verbalized understanding of these instructions and education.  Patient Consent Given: Yes  Education handout provided: Yes (verbally and added to HEP) Muscles treated: lumbar multifidi Electrical stimulation performed: No Parameters: N/A Treatment response/outcome: increased soft tissue length   DATE: 12/26/22 NuStep L3 x 6' PT present to discuss status and plan for session Supine PPT with VC for efforts to draw in abs and engage PF, exhale on tilt and indraw - Pt has great difficulty coordinating Lower trunk rotation rocking knees side to side x 20  Supine 1lb bil UE raise x10 with ab indraw, knees on rounded bolster for support Supine 1lb d2 flexion x 10 each side with ab indraw, knees on rounded bolster for support Seated lumbar flexion and flexion with SB physioball rollouts x 10 each Seated lumbar flexion hang stretch 5x10" holds (HEP) Seated bil 1lb shoulder flexion to shoulder height with exhale on lift for TA indraw x 10 Manual Therapy in prone:  Addaday L1 to bilateral lumbar multifidi, glutes/piriformis, hamstrings, and IT band Neuro re-ed: signif ongoing cueing for visualization, exhale on pelvic lift and abdominal indraw within  therex   DATE: 12/24/2022 Nustep level 3 x6 min with PT present to discuss status FOTO, 5 times sit to stand, TUG Supine posterior pelvic tilts 2x10 Supine lower trunk rotation 2x5 Supine ab sets 2x10 Supine bent knee fallouts 10x5 sec Prone hamstring curl x10 bilat Manual Therapy in prone:  Addaday to bilateral lumbar multifidi, glutes/piriformis, hamstrings, and IT band      PATIENT EDUCATION:  Education details: urge techniques, Access Code: [email or web address] Person educated: Patient Education method: Explanation, Demonstration, Verbal cues, and Handouts Education comprehension: verbalized understanding  HOME EXERCISE PROGRAM: Access Code: [email or web address] - medbridge updated   ASSESSMENT:  CLINICAL IMPRESSION: Pt with ongoing LBP pain limiting standing to several min.  Pt did well with engaging core with exhale.  Stayed with basic abdominal contractions helped due to more advanced exercises caused increased spasms in her back.  Pt was able to add exhale with contraction in seated and supine without pain.  Pt tolerated dry needling and will follow up on any lasting effects.  Overall, she has come close to meeting long term goals for bladder urgency and met goal for controlling leakage.  Pt will benefit from skilled PT to continue to work on core stability and pain management for maximum functional outcomes.  OBJECTIVE IMPAIRMENTS: decreased coordination, decreased endurance, difficulty walking, decreased ROM, decreased strength, increased muscle spasms, impaired flexibility, impaired tone, postural dysfunction, obesity, and pain.   ACTIVITY LIMITATIONS: standing and continence  PARTICIPATION LIMITATIONS: community activity  PERSONAL FACTORS: 3+ comorbidities: C-section, Lt knee surgery x2; Rt knee x4, appendectomy, GERD, PMH kidney stone, back pain  are also affecting patient's functional outcome.   REHAB POTENTIAL: Good  CLINICAL DECISION MAKING: Evolving/moderate complexity  EVALUATION  COMPLEXITY: Moderate   GOALS: Goals reviewed with patient? Yes  SHORT TERM GOALS: Target date: 12/20/22  Ind with urge technique Baseline: Goal status: MET  2.  Ind with initial HEP Baseline:  Goal status: MET on 12/24/2022  3.  Report 20% less leakage in the morning Baseline:  Goal status: INITIAL  4. Patient to report pain in low back no greater than 4/10  5. Patient to be able to stand for at least 5 min    LONG TERM GOALS: Target date: 02/14/23  Pt will report 75% less urgency after standing up Baseline: 60-65% Goal status: IN PROGRESS  2.  Pt will report no leaking when walking to the bathroom Baseline: no leakage Goal status: MET  3.  Pt will be independent with advanced HEP to maintain improvements made throughout therapy  Baseline:  Goal status: IN PROGRESS  4.  Pt will report 25% reduced low back pain due to improved pelvic floor function and coordination. Baseline: very few minutes Goal status: IN PROGRESS 5.  Pt will demonstrate improved standing posture due to strength and hip ROM for ability to stand at least 30 minutes at a time Baseline: can stand a few minutes  at a time Goal status: IN PROGRESS 6. Patient to report low back and leg pain at no greater than 2/10 Baseline: 8-9/10  Goal status: INITIAL  7. Patient to improve on FOTO to predicted outcome   Goal status: INITIAL  8. Patient to improve TUG and 5 times sit to stand by 2-3 seconds   Goal status: INITIAL  9. Patient to report 85% improvement in overall symptoms    Goal status: INITIAL   PLAN:  PT FREQUENCY: 1-2x/week   PT DURATION: 6 weeks  PLANNED INTERVENTIONS: Therapeutic exercises, Therapeutic activity, Neuromuscular re-education, Balance training, Gait training, Patient/Family education, Self Care, Joint mobilization, Dry Needling, Electrical stimulation, Cryotherapy, Moist heat, Taping, Biofeedback, Manual therapy, and Re-evaluation  PLAN FOR NEXT SESSION: ask about interest  in aquatic therapy so she can progress exercises and while avoiding pain from severe knee OA, f/u on dry needling lumbar #1, pogress core with exhale on exertion   Gustavus Bryant, PT, DPT 01/01/23 9:05 AM    [facility] [address], [place], [place] [address] Phone # [phone] Fax [phone]

## 2023-01-01 ENCOUNTER — Ambulatory Visit: Payer: 59 | Admitting: Physical Therapy

## 2023-01-01 DIAGNOSIS — M5459 Other low back pain: Secondary | ICD-10-CM

## 2023-01-01 DIAGNOSIS — M6281 Muscle weakness (generalized): Secondary | ICD-10-CM

## 2023-01-01 DIAGNOSIS — R252 Cramp and spasm: Secondary | ICD-10-CM

## 2023-01-01 DIAGNOSIS — R279 Unspecified lack of coordination: Secondary | ICD-10-CM

## 2023-01-01 DIAGNOSIS — R293 Abnormal posture: Secondary | ICD-10-CM

## 2023-01-02 ENCOUNTER — Ambulatory Visit: Payer: 59

## 2023-01-02 DIAGNOSIS — M5459 Other low back pain: Secondary | ICD-10-CM | POA: Diagnosis not present

## 2023-01-02 DIAGNOSIS — R252 Cramp and spasm: Secondary | ICD-10-CM

## 2023-01-02 DIAGNOSIS — M6281 Muscle weakness (generalized): Secondary | ICD-10-CM

## 2023-01-02 DIAGNOSIS — R293 Abnormal posture: Secondary | ICD-10-CM

## 2023-01-02 DIAGNOSIS — M62838 Other muscle spasm: Secondary | ICD-10-CM

## 2023-01-02 NOTE — Therapy (Signed)
OUTPATIENT PHYSICAL THERAPY TREATMENT NOTE   Patient Name: [name] MRN: [id number] DOB:1961/02/04, 62 y.o., female Today's Date: 01/02/2023  END OF SESSION:  PT End of Session - 01/02/23 1453     Visit Number 6    Date for PT Re-Evaluation 02/14/23    Authorization Type [employer]    Progress Note Due on Visit 10    PT Start Time 1445    PT Stop Time 1525    PT Time Calculation (min) 40 min    Activity Tolerance Patient tolerated treatment well    Behavior During Therapy WFL for tasks assessed/performed               Past Medical History:  Diagnosis Date   ALLERGIC RHINITIS 10/14/2008   ANXIETY 10/14/2008   ASTHMA 10/14/2008   DEGENERATIVE JOINT DISEASE 10/14/2008   GERD 10/14/2008   HYPERLIPIDEMIA 10/14/2008   INTERMITTENT VERTIGO 11/03/2008   NEPHROLITHIASIS, HX OF 10/14/2008   Past Surgical History:  Procedure Laterality Date   APPENDECTOMY     CESAREAN SECTION     s/p left knee surgery     s/p left ulnar nerve surgery     x's 2   s/p nasal surgery     x's 2   s/p right knee surgery     x's 4- Cartilage torn   TONSILLECTOMY     Patient Active Problem List   Diagnosis Date Noted   Vitamin D deficiency 06/20/2022   Infected tick bite of abdominal wall 03/22/2021   Acute sinusitis 03/10/2020   Kidney stones 09/02/2019   Trapezoid ligament sprain 08/30/2019   Cellulitis of right breast 07/03/2018   Bilateral leg pain 05/01/2018   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   Primary osteoarthritis of both knees 04/23/2017   Chest pain 10/17/2016   Essential hypertension 10/17/2016   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   Macular degeneration 06/29/2016   Recurrent headache 06/29/2016   Hyperglycemia 06/15/2016   MRSA exposure 06/10/2016   Cervical disc disorder 05/11/2016   Acute bronchitis 11/03/2014   Wheezing 11/03/2014   Myalgia 10/06/2014   Fever in adult 10/06/2014   Muscle strain of right gluteal region 06/22/2014   Greater trochanteric bursitis of  right hip 05/17/2014   Lateral epicondylitis of right elbow 06/24/2013   Chronic pain 10/13/2012   Vertigo 05/09/2012   Encounter for well adult exam with abnormal findings 11/11/2011   INTERMITTENT VERTIGO 11/03/2008   HLD (hyperlipidemia) 10/14/2008   Anxiety state 10/14/2008   Allergic rhinitis 10/14/2008   Asthma 10/14/2008   GERD 10/14/2008   NEPHROLITHIASIS, HX OF 10/14/2008    PCP: Biagio Borg, MD  REFERRING PROVIDER:   Tyson Dense, MD   REFERRING DIAG: N39.3 (ICD-10-CM) - Stress incontinence (female) (female)   THERAPY DIAG:  Muscle weakness (generalized)  Cramp and spasm  Abnormal posture  Other low back pain  Other muscle spasm  Rationale for Evaluation and Treatment: Rehabilitation  ONSET DATE: a couple of years  SUBJECTIVE:  SUBJECTIVE STATEMENT: I don't have any pain unless I am standing.    Fluid intake: Yes: 2 bottles of water and pepsi and/or tea with meals    PAIN:  PAIN:  Are you having pain? Yes NPRS scale: 6/10 Pain location: low back and knees Pain description: sharp and aching  Aggravating factors: certain movements/activities Relieving factors: medication, rest, massage    PRECAUTIONS: None  WEIGHT BEARING RESTRICTIONS: No  FALLS:  Has patient fallen in last 6 months? No  LIVING ENVIRONMENT: Lives with: lives with their family and daughter and her husband Lives in: House/apartment   OCCUPATION: retired  PLOF: Independent  PATIENT GOALS: not have extreme urgency and leakage managed           12/18/22: to be able to control back pain and be able to exercise so that she can lose weight in preparation for her knee surgery  PERTINENT HISTORY:  C-section, Lt knee surgery x2; Rt knee x4, appendectomy, GERD, PMH kidney stone, back  pain Sexual abuse: No  BOWEL MOVEMENT: Pain with bowel movement: No Type of bowel movement:Type (Bristol Stool Scale) loose recently, Frequency every other day, and Strain Yes Fully empty rectum: Yes:   Leakage: No Pads: No Fiber supplement: No  URINATION: Pain with urination: No Fully empty bladder: Yes:   Stream: Strong Urgency: Yes:   Frequency: 3 hours Leakage: Urge to void and Coughing Pads: No   INTERCOURSE: Pain with intercourse: Not currently active  PREGNANCY: C-section deliveries 1   PROLAPSE: None   OBJECTIVE:   DIAGNOSTIC FINDINGS: MRI 12/05/22 IMPRESSION: 1. Large partially calcified inferiorly migrated disc extrusion at L1-L2 resulting in moderate to severe spinal canal stenosis with right subarticular zone effacement and likely impingement of the traversing right L2 nerve root, also present on the CT abdomen/pelvis from 2019. 2. Advanced facet arthropathy at L3-L4 and L4-L5 with associated effusions which may reflect a source of pain. 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 3. Disc protrusions and facet arthropathy at T11-T12 and T12-L1 resulting in mild-to-moderate spinal canal stenosis at T11-T12 and left subarticular zone narrowing without evidence of frank nerve root impingement at T12-L1.   PATIENT SURVEYS:  12/24/2022:  FOTO 53% (projected 62% by visit 11)  COGNITION: Overall cognitive status: Within functional limits for tasks assessed     SENSATION: MUSCLE LENGTH: Hamstrings: WFL but limited to approx 50-60 degrees bilaterally which would affect Lumbar spine Positive Thomas test bilaterally with significant lack of knee extension   LUMBAR SPECIAL TESTS:  Straight leg raise test: Negative  FUNCTIONAL TESTS:  Eval: Single leg - has lateral lean and needs  UE assist due to knee pain and decreased mobility  12/24/2022: 5 times sit to stand:  28.55 sec with UE use required TUG: 14.6 sec  GAIT:  Comments: lateral weight shift, flexed trunk, decreased knee ext and no heel strike 12/18/22: antalgic, waddle gait and same as above  POSTURE: increased lumbar lordosis, anterior pelvic tilt, flexed trunk , weight shift right, weight shift left, and no heel strike knee flexed to about 10 deg  PELVIC ALIGNMENT:  LUMBARAROM/PROM:   All WNL with pain in extension  LOWER EXTREMITY ROM:  passive ROM Right eval Left eval  Hip flexion 75% 50%  Hip extension 50% 50%  Hip abduction    Hip adduction    Hip internal rotation 50% 50%  Hip external rotation 50% 50%  Knee flexion 60 pain 60 pain  Knee extension -10 pain -10 pain  Ankle dorsiflexion    Ankle plantarflexion    Ankle inversion    Ankle eversion     (Blank rows = not tested)  LOWER EXTREMITY MMT:  MMT Right eval Left eval  Hip flexion 4/5   Hip extension    Hip abduction 5/5 5/5  Hip adduction 5/5 5/5  Hip internal rotation    Hip external rotation    Knee flexion    Knee extension    Ankle dorsiflexion    Ankle plantarflexion    Ankle inversion    Ankle eversion     PALPATION:   General  adductors tight                External Perineal Exam tight adductors attachment                             Internal Pelvic Floor levators stronger on the Rt  Patient confirms identification and approves PT to assess internal pelvic floor and treatment Yes  PELVIC MMT:   MMT eval  Vaginal 3/5 x 3 reps; hold 5 then tone is increased very slow to relax after contracting  Internal Anal Sphincter   External Anal Sphincter   Puborectalis   Diastasis Recti   (Blank rows = not tested)        TONE: High Rt>Lt   PROLAPSE: no  TODAY'S TREATMENT:                                                                                                                              DATE:  01/02/23 Nustep L3 x 5 min Seated mini sit ups with yellow plyo ball 2 x 10 Seated shoulder to hip with yellow plyo ball 2 x 10 Seated hip to hip with yellow plyo ball x 20 Supine PPT x 20 Hooklying PPT with shoulder extension to knee lift 2 x 10 (yellow plyo ball) Hooklying lower trunk rotation x 20  Hooklying PPT with march (heels hit first) x 20 Hooklying PPT with alternating arms and legs x 20 Hooklying PPT with bilateral overhead pull downs (red tband with handles; therapist holding tband from behind) 2 x 10 Hooklying PPT with bilateral overhead pull downs with alternating marches (red tband with handles; therapist holding tband from behind) 2 x 10 Seated physio ball roll outs x 10 each of fwd and laterals to both sides (blue physio ball) Seated ball press down on thighs x 10 holding 5 sec each Pallof press with red tband with handles (red band with handles) x 10 each side  DATE: 01/01/23 NuStep L3 x 6' PT present to discuss status and plan for session  Neuro re-ed: ongoing cueing for visualization, exhale on  abdominal indraw within therex, TC on thoracic and lumbar paraspinals to stay relaxed with exhale and  abdominal engaged, avoiding bulging abdomen Supine with head slightly elevated and LE on roll for knee in flexion - pball pressing down, pball lifting up, breathing with arms relaxed Seated pressing on pillows exhale with abdominal activation  Manual: Lumbar and thoraicc paraspinals STM and cupping Trigger Point Dry-Needling  Treatment instructions: Expect mild to moderate muscle soreness. S/S of pneumothorax if dry needled over a lung field, and to seek immediate medical attention should they occur. Patient verbalized understanding of these instructions and education.  Patient Consent Given: Yes  Education handout provided: Yes (verbally and added to HEP) Muscles treated: lumbar multifidi Electrical stimulation performed: No Parameters: N/A Treatment response/outcome:  increased soft tissue length   DATE: 12/26/22 NuStep L3 x 6' PT present to discuss status and plan for session Supine PPT with VC for efforts to draw in abs and engage PF, exhale on tilt and indraw - Pt has great difficulty coordinating Lower trunk rotation rocking knees side to side x 20 Supine 1lb bil UE raise x10 with ab indraw, knees on rounded bolster for support Supine 1lb d2 flexion x 10 each side with ab indraw, knees on rounded bolster for support Seated lumbar flexion and flexion with SB physioball rollouts x 10 each Seated lumbar flexion hang stretch 5x10" holds (HEP) Seated bil 1lb shoulder flexion to shoulder height with exhale on lift for TA indraw x 10 Manual Therapy in prone:  Addaday L1 to bilateral lumbar multifidi, glutes/piriformis, hamstrings, and IT band Neuro re-ed: signif ongoing cueing for visualization, exhale on pelvic lift and abdominal indraw within therex   PATIENT EDUCATION:  Education details: urge techniques, Access Code: [email or web address] Person educated: Patient Education method: Explanation, Demonstration, Verbal cues, and Handouts Education comprehension: verbalized understanding  HOME EXERCISE PROGRAM: Access Code: [email or web address] - medbridge updated   ASSESSMENT:  CLINICAL IMPRESSION: Lawsyn is progressing slowly due to multiple orthopedic issues.  She was able to complete higher level core exercises today without increased pain.   Pt will benefit from skilled PT to continue to work on core stability and pain management for maximum functional outcomes.  OBJECTIVE IMPAIRMENTS: decreased coordination, decreased endurance, difficulty walking, decreased ROM, decreased strength, increased muscle spasms, impaired flexibility, impaired tone, postural dysfunction, obesity, and pain.   ACTIVITY LIMITATIONS: standing and continence  PARTICIPATION LIMITATIONS: community activity  PERSONAL FACTORS: 3+ comorbidities: C-section, Lt knee surgery x2; Rt knee x4,  appendectomy, GERD, PMH kidney stone, back pain  are also affecting patient's functional outcome.   REHAB POTENTIAL: Good  CLINICAL DECISION MAKING: Evolving/moderate complexity  EVALUATION COMPLEXITY: Moderate   GOALS: Goals reviewed with patient? Yes  SHORT TERM GOALS: Target date: 12/20/22  Ind with urge technique Baseline: Goal status: MET  2.  Ind with initial HEP Baseline:  Goal status: MET on 12/24/2022  3.  Report 20% less leakage in the morning Baseline:  Goal status: INITIAL  4. Patient to report pain in low back no greater than 4/10  5. Patient to be able to stand for at least 5 min    LONG TERM GOALS: Target date: 02/14/23  Pt will report 75% less urgency after standing up Baseline: 60-65% Goal status: IN PROGRESS  2.  Pt will report no leaking when walking to the bathroom Baseline: no leakage Goal status: MET  3.  Pt will be independent with advanced HEP to maintain improvements made throughout therapy  Baseline:  Goal status: IN PROGRESS  4.  Pt will report 25% reduced low back pain due to improved pelvic floor function  and coordination. Baseline: very few minutes Goal status: IN PROGRESS 5.  Pt will demonstrate improved standing posture due to strength and hip ROM for ability to stand at least 30 minutes at a time Baseline: can stand a few minutes at a time Goal status: IN PROGRESS 6. Patient to report low back and leg pain at no greater than 2/10 Baseline: 8-9/10  Goal status: INITIAL  7. Patient to improve on FOTO to predicted outcome   Goal status: INITIAL  8. Patient to improve TUG and 5 times sit to stand by 2-3 seconds   Goal status: INITIAL  9. Patient to report 85% improvement in overall symptoms    Goal status: INITIAL   PLAN:  PT FREQUENCY: 1-2x/week   PT DURATION: 6 weeks  PLANNED INTERVENTIONS: Therapeutic exercises, Therapeutic activity, Neuromuscular re-education, Balance training, Gait training, Patient/Family  education, Self Care, Joint mobilization, Dry Needling, Electrical stimulation, Cryotherapy, Moist heat, Taping, Biofeedback, Manual therapy, and Re-evaluation  PLAN FOR NEXT SESSION: ask about interest in aquatic therapy so she can progress exercises and while avoiding pain from severe knee OA, f/u on dry needling lumbar #1, pogress core with exhale on exertion   Seaver Machia B. Briona Korpela, PT 01/02/23 5:22 PM  [facility] [address] [place], [place] [address] Phone # [phone] Fax [phone]

## 2023-01-07 NOTE — Therapy (Unsigned)
OUTPATIENT PHYSICAL THERAPY TREATMENT NOTE   Patient Name: [name] MRN: [id number] DOB:Oct 23, 1961, 62 y.o., female Today's Date: 01/08/2023  END OF SESSION:  PT End of Session - 01/08/23 0756     Visit Number 7    Date for PT Re-Evaluation 02/14/23    Authorization Type [employer]    Progress Note Due on Visit 10    PT Start Time 0756    PT Stop Time 0835    PT Time Calculation (min) 39 min    Activity Tolerance Patient tolerated treatment well    Behavior During Therapy [facility] for tasks assessed/performed                Past Medical History:  Diagnosis Date   ALLERGIC RHINITIS 10/14/2008   ANXIETY 10/14/2008   ASTHMA 10/14/2008   DEGENERATIVE JOINT DISEASE 10/14/2008   GERD 10/14/2008   HYPERLIPIDEMIA 10/14/2008   INTERMITTENT VERTIGO 11/03/2008   NEPHROLITHIASIS, HX OF 10/14/2008   Past Surgical History:  Procedure Laterality Date   APPENDECTOMY     CESAREAN SECTION     s/p left knee surgery     s/p left ulnar nerve surgery     x's 2   s/p nasal surgery     x's 2   s/p right knee surgery     x's 4- Cartilage torn   TONSILLECTOMY     Patient Active Problem List   Diagnosis Date Noted   Vitamin D deficiency 06/20/2022   Infected tick bite of abdominal wall 03/22/2021   Acute sinusitis 03/10/2020   Kidney stones 09/02/2019   Trapezoid ligament sprain 08/30/2019   Cellulitis of right breast 07/03/2018   Bilateral leg pain 05/01/2018   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   Primary osteoarthritis of both knees 04/23/2017   Chest pain 10/17/2016   Essential hypertension 10/17/2016   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   Macular degeneration 06/29/2016   Recurrent headache 06/29/2016   Hyperglycemia 06/15/2016   MRSA exposure 06/10/2016   Cervical disc disorder 05/11/2016   Acute bronchitis 11/03/2014   Wheezing 11/03/2014   Myalgia 10/06/2014   Fever in adult 10/06/2014   Muscle strain of right gluteal region 06/22/2014   Greater trochanteric bursitis  of right hip 05/17/2014   Lateral epicondylitis of right elbow 06/24/2013   Chronic pain 10/13/2012   Vertigo 05/09/2012   Encounter for well adult exam with abnormal findings 11/11/2011   INTERMITTENT VERTIGO 11/03/2008   HLD (hyperlipidemia) 10/14/2008   Anxiety state 10/14/2008   Allergic rhinitis 10/14/2008   Asthma 10/14/2008   GERD 10/14/2008   NEPHROLITHIASIS, HX OF 10/14/2008    PCP: Biagio Borg, MD  REFERRING PROVIDER:   Tyson Dense, MD   REFERRING DIAG: N39.3 (ICD-10-CM) - Stress incontinence (female) (female)   THERAPY DIAG:  Muscle weakness (generalized)  Cramp and spasm  Abnormal posture  Other low back pain  Rationale for Evaluation and Treatment: Rehabilitation  ONSET DATE: a couple of years  SUBJECTIVE:  SUBJECTIVE STATEMENT: I have been in severe pain since the last PT appointment.  It is hard to do those exercises because my knees can only move so far and I felt my back popping the whole time trying to do the exercises.  Still hurting a lot today  Fluid intake: Yes: 2 bottles of water and pepsi and/or tea with meals    PAIN:  PAIN:  Are you having pain? Yes NPRS scale: 8.5/10 Pain location: low back more on the right side Pain description: sharp and aching  Aggravating factors: whatever we did last time Relieving factors: medication, rest, massage    PRECAUTIONS: None  WEIGHT BEARING RESTRICTIONS: No  FALLS:  Has patient fallen in last 6 months? No  LIVING ENVIRONMENT: Lives with: lives with their family and daughter and her husband Lives in: House/apartment   OCCUPATION: retired  PLOF: Independent  PATIENT GOALS: not have extreme urgency and leakage managed           12/18/22: to be able to control back pain and be able to exercise so that  she can lose weight in preparation for her knee surgery  PERTINENT HISTORY:  C-section, Lt knee surgery x2; Rt knee x4, appendectomy, GERD, PMH kidney stone, back pain Sexual abuse: No  BOWEL MOVEMENT: Pain with bowel movement: No Type of bowel movement:Type (Bristol Stool Scale) loose recently, Frequency every other day, and Strain Yes Fully empty rectum: Yes:   Leakage: No Pads: No Fiber supplement: No  URINATION: Pain with urination: No Fully empty bladder: Yes:   Stream: Strong Urgency: Yes:   Frequency: 3 hours Leakage: Urge to void and Coughing Pads: No   INTERCOURSE: Pain with intercourse: Not currently active  PREGNANCY: C-section deliveries 1   PROLAPSE: None   OBJECTIVE:   DIAGNOSTIC FINDINGS: MRI 12/05/22 IMPRESSION: 1. Large partially calcified inferiorly migrated disc extrusion at L1-L2 resulting in moderate to severe spinal canal stenosis with right subarticular zone effacement and likely impingement of the traversing right L2 nerve root, also present on the CT abdomen/pelvis from 2019. 2. Advanced facet arthropathy at L3-L4 and L4-L5 with associated effusions which may reflect a source of pain. 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 3. Disc protrusions and facet arthropathy at T11-T12 and T12-L1 resulting in mild-to-moderate spinal canal stenosis at T11-T12 and left subarticular zone narrowing without evidence of frank nerve root impingement at T12-L1.   PATIENT SURVEYS:  12/24/2022:  FOTO 53% (projected 62% by visit 11)  COGNITION: Overall cognitive status: Within functional limits for tasks assessed     SENSATION: MUSCLE LENGTH: Hamstrings: WFL but limited to approx 50-60 degrees bilaterally which would affect Lumbar spine Positive Thomas test bilaterally with  significant lack of knee extension   LUMBAR SPECIAL TESTS:  Straight leg raise test: Negative  FUNCTIONAL TESTS:  Eval: Single leg - has lateral lean and needs UE assist due to knee pain and decreased mobility  12/24/2022: 5 times sit to stand:  28.55 sec with UE use required TUG: 14.6 sec  GAIT:  Comments: lateral weight shift, flexed trunk, decreased knee ext and no heel strike 12/18/22: antalgic, waddle gait and same as above  POSTURE: increased lumbar lordosis, anterior pelvic tilt, flexed trunk , weight shift right, weight shift left, and no heel strike knee flexed to about 10 deg  PELVIC ALIGNMENT:  LUMBARAROM/PROM:   All WNL  with pain in extension  LOWER EXTREMITY ROM:  passive ROM Right eval Left eval  Hip flexion 75% 50%  Hip extension 50% 50%  Hip abduction    Hip adduction    Hip internal rotation 50% 50%  Hip external rotation 50% 50%  Knee flexion 60 pain 60 pain  Knee extension -10 pain -10 pain  Ankle dorsiflexion    Ankle plantarflexion    Ankle inversion    Ankle eversion     (Blank rows = not tested)  LOWER EXTREMITY MMT:  MMT Right eval Left eval  Hip flexion 4/5   Hip extension    Hip abduction 5/5 5/5  Hip adduction 5/5 5/5  Hip internal rotation    Hip external rotation    Knee flexion    Knee extension    Ankle dorsiflexion    Ankle plantarflexion    Ankle inversion    Ankle eversion     PALPATION:   General  adductors tight                External Perineal Exam tight adductors attachment                             Internal Pelvic Floor levators stronger on the Rt  Patient confirms identification and approves PT to assess internal pelvic floor and treatment Yes  PELVIC MMT:   MMT eval  Vaginal 3/5 x 3 reps; hold 5 then tone is increased very slow to relax after contracting  Internal Anal Sphincter   External Anal Sphincter   Puborectalis   Diastasis Recti   (Blank rows = not tested)        TONE: High Rt>Lt    PROLAPSE: no  TODAY'S TREATMENT:                                                                                                                              DATE: 01/08/23  Neuro re-ed: ongoing cueing for exhale on  abdominal indraw within therex, TC on thoracic and lumbar paraspinals to stay relaxed with exhale and abdominal engaged, avoiding bulging abdomen - needing much less cueing in supine today Supine with head slightly elevated and LE on roll for knee in flexion - pball pressing down, pball lifting up, green band horizontal abduction, opposite UE/LE Seated pressing on pillows exhale with abdominal activation  Manual: Lumbar and thoraicc paraspinals STM and cupping Trigger Point Dry-Needling  Treatment instructions: Expect mild to moderate muscle soreness. S/S of pneumothorax if dry needled over a lung field, and to seek immediate medical attention should they occur. Patient verbalized understanding of these instructions and education.  Patient Consent Given: Yes Education handout provided: Previously provided Muscles treated: lumbar multifidi Electrical stimulation performed: No Parameters: N/A Treatment response/outcome: increased soft tissue length   DATE: 01/02/23 Nustep L3 x 5 min Seated mini sit ups with yellow plyo ball 2 x 10  Seated shoulder to hip with yellow plyo ball 2 x 10 Seated hip to hip with yellow plyo ball x 20 Supine PPT x 20 Hooklying PPT with shoulder extension to knee lift 2 x 10 (yellow plyo ball) Hooklying lower trunk rotation x 20  Hooklying PPT with march (heels hit first) x 20 Hooklying PPT with alternating arms and legs x 20 Hooklying PPT with bilateral overhead pull downs (red tband with handles; therapist holding tband from behind) 2 x 10 Hooklying PPT with bilateral overhead pull downs with alternating marches (red tband with handles; therapist holding tband from behind) 2 x 10 Seated physio ball roll outs x 10 each of fwd and laterals to  both sides (blue physio ball) Seated ball press down on thighs x 10 holding 5 sec each Pallof press with red tband with handles (red band with handles) x 10 each side  DATE: 01/01/23 NuStep L3 x 6' PT present to discuss status and plan for session  Neuro re-ed: ongoing cueing for visualization, exhale on  abdominal indraw within therex, TC on thoracic and lumbar paraspinals to stay relaxed with exhale and abdominal engaged, avoiding bulging abdomen Supine with head slightly elevated and LE on roll for knee in flexion - pball pressing down, pball lifting up, breathing with arms relaxed Seated pressing on pillows exhale with abdominal activation  Manual: Lumbar and thoraicc paraspinals STM and cupping Trigger Point Dry-Needling  Treatment instructions: Expect mild to moderate muscle soreness. S/S of pneumothorax if dry needled over a lung field, and to seek immediate medical attention should they occur. Patient verbalized understanding of these instructions and education.  Patient Consent Given: Yes  Education handout provided: Yes (verbally and added to HEP) Muscles treated: lumbar multifidi Electrical stimulation performed: No Parameters: N/A Treatment response/outcome: increased soft tissue length   PATIENT EDUCATION:  Education details: urge techniques, Access Code: [id number] Person educated: Patient Education method: Explanation, Demonstration, Verbal cues, and Handouts Education comprehension: verbalized understanding  HOME EXERCISE PROGRAM: Access Code: [email or web address] - medbridge updated   ASSESSMENT:  CLINICAL IMPRESSION: Pt has made excellent progress with urinary urgency and leakage and has met those goals.  Pt needing much less cueing in supine today and able to add band exercises and added cross body work for isometric obliques.  Pt still needing to do basic level exercises due to back pain.   Pt will benefit from skilled PT to continue to work on core stability and pain  management for maximum functional outcomes.  OBJECTIVE IMPAIRMENTS: decreased coordination, decreased endurance, difficulty walking, decreased ROM, decreased strength, increased muscle spasms, impaired flexibility, impaired tone, postural dysfunction, obesity, and pain.   ACTIVITY LIMITATIONS: standing and continence  PARTICIPATION LIMITATIONS: community activity  PERSONAL FACTORS: 3+ comorbidities: C-section, Lt knee surgery x2; Rt knee x4, appendectomy, GERD, PMH kidney stone, back pain  are also affecting patient's functional outcome.   REHAB POTENTIAL: Good  CLINICAL DECISION MAKING: Evolving/moderate complexity  EVALUATION COMPLEXITY: Moderate   GOALS: Goals reviewed with patient? Yes  SHORT TERM GOALS: Target date: 12/20/22  Ind with urge technique Baseline: Goal status: MET  2.  Ind with initial HEP Baseline:  Goal status: MET on 12/24/2022  3.  Report 20% less leakage in the morning Baseline:  Goal status: MET  4. Patient to report pain in low back no greater than 4/10  5. Patient to be able to stand for at least 5 min    LONG TERM GOALS: Target date: 02/14/23  Pt will report 75% less urgency  after standing up Baseline: none Goal status: MET  2.  Pt will report no leaking when walking to the bathroom Baseline: no leakage Goal status: MET  3.  Pt will be independent with advanced HEP to maintain improvements made throughout therapy  Baseline:  Goal status: IN PROGRESS  4.  Pt will report 25% reduced low back pain due to improved pelvic floor function and coordination. Baseline: flare up this week Goal status: IN PROGRESS 5.  Pt will demonstrate improved standing posture due to strength and hip ROM for ability to stand at least 30 minutes at a time Baseline: can stand a few minutes at a time Goal status: IN PROGRESS 6. Patient to report low back and leg pain at no greater than 2/10 Baseline: 8-9/10  Goal status: INITIAL  7. Patient to improve on FOTO to  predicted outcome   Goal status: INITIAL  8. Patient to improve TUG and 5 times sit to stand by 2-3 seconds   Goal status: INITIAL  9. Patient to report 85% improvement in overall symptoms    Goal status: INITIAL   PLAN:  PT FREQUENCY: 1-2x/week   PT DURATION: 6 weeks  PLANNED INTERVENTIONS: Therapeutic exercises, Therapeutic activity, Neuromuscular re-education, Balance training, Gait training, Patient/Family education, Self Care, Joint mobilization, Dry Needling, Electrical stimulation, Cryotherapy, Moist heat, Taping, Biofeedback, Manual therapy, and Re-evaluation  PLAN FOR NEXT SESSION:  f/u on dry needling lumbar #2, pogress core with exhale on exertion  Gustavus Bryant, PT, DPT 01/08/23 8:40 AM  [facility] [address], [place], [place] [address] Phone # [phone] Fax [phone]

## 2023-01-08 ENCOUNTER — Encounter: Payer: Self-pay | Admitting: Physical Therapy

## 2023-01-08 ENCOUNTER — Ambulatory Visit: Payer: 59 | Admitting: Physical Therapy

## 2023-01-08 DIAGNOSIS — R252 Cramp and spasm: Secondary | ICD-10-CM

## 2023-01-08 DIAGNOSIS — M6281 Muscle weakness (generalized): Secondary | ICD-10-CM

## 2023-01-08 DIAGNOSIS — M5459 Other low back pain: Secondary | ICD-10-CM | POA: Diagnosis not present

## 2023-01-08 DIAGNOSIS — R293 Abnormal posture: Secondary | ICD-10-CM

## 2023-01-09 ENCOUNTER — Ambulatory Visit: Payer: 59 | Admitting: Physical Therapy

## 2023-01-09 ENCOUNTER — Encounter: Payer: Self-pay | Admitting: Physical Therapy

## 2023-01-09 DIAGNOSIS — M6281 Muscle weakness (generalized): Secondary | ICD-10-CM

## 2023-01-09 DIAGNOSIS — R252 Cramp and spasm: Secondary | ICD-10-CM

## 2023-01-09 DIAGNOSIS — R293 Abnormal posture: Secondary | ICD-10-CM

## 2023-01-09 DIAGNOSIS — M5459 Other low back pain: Secondary | ICD-10-CM | POA: Diagnosis not present

## 2023-01-09 NOTE — Therapy (Signed)
OUTPATIENT PHYSICAL THERAPY TREATMENT NOTE   Patient Name: [name] MRN: [id number] DOB:06-21-1961, 62 y.o., female Today's Date: 01/09/2023  END OF SESSION:  PT End of Session - 01/09/23 1018     Visit Number 8    Date for PT Re-Evaluation 02/14/23    Authorization Type [employer]    Progress Note Due on Visit 10    PT Start Time 1016    PT Stop Time 1057    PT Time Calculation (min) 41 min    Activity Tolerance Patient tolerated treatment well    Behavior During Therapy WFL for tasks assessed/performed                 Past Medical History:  Diagnosis Date   ALLERGIC RHINITIS 10/14/2008   ANXIETY 10/14/2008   ASTHMA 10/14/2008   DEGENERATIVE JOINT DISEASE 10/14/2008   GERD 10/14/2008   HYPERLIPIDEMIA 10/14/2008   INTERMITTENT VERTIGO 11/03/2008   NEPHROLITHIASIS, HX OF 10/14/2008   Past Surgical History:  Procedure Laterality Date   APPENDECTOMY     CESAREAN SECTION     s/p left knee surgery     s/p left ulnar nerve surgery     x's 2   s/p nasal surgery     x's 2   s/p right knee surgery     x's 4- Cartilage torn   TONSILLECTOMY     Patient Active Problem List   Diagnosis Date Noted   Vitamin D deficiency 06/20/2022   Infected tick bite of abdominal wall 03/22/2021   Acute sinusitis 03/10/2020   Kidney stones 09/02/2019   Trapezoid ligament sprain 08/30/2019   Cellulitis of right breast 07/03/2018   Bilateral leg pain 05/01/2018   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   Primary osteoarthritis of both knees 04/23/2017   Chest pain 10/17/2016   Essential hypertension 10/17/2016   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   Macular degeneration 06/29/2016   Recurrent headache 06/29/2016   Hyperglycemia 06/15/2016   MRSA exposure 06/10/2016   Cervical disc disorder 05/11/2016   Acute bronchitis 11/03/2014   Wheezing 11/03/2014   Myalgia 10/06/2014   Fever in adult 10/06/2014   Muscle strain of right gluteal region 06/22/2014   Greater trochanteric  bursitis of right hip 05/17/2014   Lateral epicondylitis of right elbow 06/24/2013   Chronic pain 10/13/2012   Vertigo 05/09/2012   Encounter for well adult exam with abnormal findings 11/11/2011   INTERMITTENT VERTIGO 11/03/2008   HLD (hyperlipidemia) 10/14/2008   Anxiety state 10/14/2008   Allergic rhinitis 10/14/2008   Asthma 10/14/2008   GERD 10/14/2008   NEPHROLITHIASIS, HX OF 10/14/2008    PCP: Biagio Borg, MD  REFERRING PROVIDER:   Tyson Dense, MD   REFERRING DIAG: N39.3 (ICD-10-CM) - Stress incontinence (female) (female)   THERAPY DIAG:  Muscle weakness (generalized)  Cramp and spasm  Abnormal posture  Other low back pain  Rationale for Evaluation and Treatment: Rehabilitation  ONSET DATE: a couple of years  SUBJECTIVE:  SUBJECTIVE STATEMENT: The DN helped until this morning when I got up and started moving around.    Fluid intake: Yes: 2 bottles of water and pepsi and/or tea with meals    PAIN:  PAIN:  Are you having pain? Yes NPRS scale: 8.5/10 Pain location: low back more on the right side Pain description: sharp and aching  Aggravating factors: whatever we did last time Relieving factors: medication, rest, massage    PRECAUTIONS: None  WEIGHT BEARING RESTRICTIONS: No  FALLS:  Has patient fallen in last 6 months? No  LIVING ENVIRONMENT: Lives with: lives with their family and daughter and her husband Lives in: House/apartment   OCCUPATION: retired  PLOF: Independent  PATIENT GOALS: not have extreme urgency and leakage managed           12/18/22: to be able to control back pain and be able to exercise so that she can lose weight in preparation for her knee surgery  PERTINENT HISTORY:  C-section, Lt knee surgery x2; Rt knee x4, appendectomy, GERD,  PMH kidney stone, back pain Sexual abuse: No  BOWEL MOVEMENT: Pain with bowel movement: No Type of bowel movement:Type (Bristol Stool Scale) loose recently, Frequency every other day, and Strain Yes Fully empty rectum: Yes:   Leakage: No Pads: No Fiber supplement: No  URINATION: Pain with urination: No Fully empty bladder: Yes:   Stream: Strong Urgency: Yes:   Frequency: 3 hours Leakage: Urge to void and Coughing Pads: No   INTERCOURSE: Pain with intercourse: Not currently active  PREGNANCY: C-section deliveries 1   PROLAPSE: None   OBJECTIVE:   DIAGNOSTIC FINDINGS: MRI 12/05/22 IMPRESSION: 1. Large partially calcified inferiorly migrated disc extrusion at L1-L2 resulting in moderate to severe spinal canal stenosis with right subarticular zone effacement and likely impingement of the traversing right L2 nerve root, also present on the CT abdomen/pelvis from 2019. 2. Advanced facet arthropathy at L3-L4 and L4-L5 with associated effusions which may reflect a source of pain. 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 3. Disc protrusions and facet arthropathy at T11-T12 and T12-L1 resulting in mild-to-moderate spinal canal stenosis at T11-T12 and left subarticular zone narrowing without evidence of frank nerve root impingement at T12-L1.   PATIENT SURVEYS:  12/24/2022:  FOTO 53% (projected 62% by visit 11)  COGNITION: Overall cognitive status: Within functional limits for tasks assessed     SENSATION: MUSCLE LENGTH: Hamstrings: WFL but limited to approx 50-60 degrees bilaterally which would affect Lumbar spine Positive Thomas test bilaterally with significant lack of knee extension   LUMBAR SPECIAL TESTS:  Straight leg raise test: Negative  FUNCTIONAL TESTS:  Eval: Single leg - has  lateral lean and needs UE assist due to knee pain and decreased mobility  12/24/2022: 5 times sit to stand:  28.55 sec with UE use required TUG: 14.6 sec  GAIT:  Comments: lateral weight shift, flexed trunk, decreased knee ext and no heel strike 12/18/22: antalgic, waddle gait and same as above  POSTURE: increased lumbar lordosis, anterior pelvic tilt, flexed trunk , weight shift right, weight shift left, and no heel strike knee flexed to about 10 deg  PELVIC ALIGNMENT:  LUMBARAROM/PROM:   All WNL with pain in extension  LOWER EXTREMITY ROM:  passive ROM Right eval Left eval  Hip flexion 75% 50%  Hip extension 50% 50%  Hip abduction  Hip adduction    Hip internal rotation 50% 50%  Hip external rotation 50% 50%  Knee flexion 60 pain 60 pain  Knee extension -10 pain -10 pain  Ankle dorsiflexion    Ankle plantarflexion    Ankle inversion    Ankle eversion     (Blank rows = not tested)  LOWER EXTREMITY MMT:  MMT Right eval Left eval  Hip flexion 4/5   Hip extension    Hip abduction 5/5 5/5  Hip adduction 5/5 5/5  Hip internal rotation    Hip external rotation    Knee flexion    Knee extension    Ankle dorsiflexion    Ankle plantarflexion    Ankle inversion    Ankle eversion     PALPATION:   General  adductors tight                External Perineal Exam tight adductors attachment                             Internal Pelvic Floor levators stronger on the Rt  Patient confirms identification and approves PT to assess internal pelvic floor and treatment Yes  PELVIC MMT:   MMT eval  Vaginal 3/5 x 3 reps; hold 5 then tone is increased very slow to relax after contracting  Internal Anal Sphincter   External Anal Sphincter   Puborectalis   Diastasis Recti   (Blank rows = not tested)        TONE: High Rt>Lt   PROLAPSE: no  TODAY'S TREATMENT:                                                                                                                               DATE: 01/09/23 Prone STM and myofascial release bil lumbar and upper gluteals, gentle sacral distraction from L5 Gr II/III Supine with knee bolster slow diaphragmatic inhale with relaxed exhale x 8 cycles Supine with knee bolster slow diaphragmatic inhale with pursed lip exhale x 8 cycles w/ TA indraw Supine TA indraw with red physioball press with counting aloud to 5 with each hold x 8 cycles Supine red weighted plyoball lifts to 90 deg with TA indraw: 4 rounds of 5 lifts (working on longer TA holds) Supine green band horiz abd 3x10 with focus on single longer hold TA indraw with each set Seated in chair: red tband bil shoulder ext and row 1x10 each, red plyoball single arm lift d2 flexion cross body 1x5 each UE, red plyoball chest press x10  DATE: 01/08/23  Neuro re-ed: ongoing cueing for exhale on  abdominal indraw within therex, TC on thoracic and lumbar paraspinals to stay relaxed with exhale and abdominal engaged, avoiding bulging abdomen - needing much less cueing in supine today Supine with head slightly elevated and LE on roll for knee in flexion - pball pressing down, pball lifting up, green band horizontal abduction,  opposite UE/LE Seated pressing on pillows exhale with abdominal activation  Manual: Lumbar and thoraicc paraspinals STM and cupping Trigger Point Dry-Needling  Treatment instructions: Expect mild to moderate muscle soreness. S/S of pneumothorax if dry needled over a lung field, and to seek immediate medical attention should they occur. Patient verbalized understanding of these instructions and education.  Patient Consent Given: Yes Education handout provided: Previously provided Muscles treated: lumbar multifidi Electrical stimulation performed: No Parameters: N/A Treatment response/outcome: increased soft tissue length   DATE: 01/02/23 Nustep L3 x 5 min Seated mini sit ups with yellow plyo ball 2 x 10 Seated shoulder to hip with yellow plyo ball 2  x 10 Seated hip to hip with yellow plyo ball x 20 Supine PPT x 20 Hooklying PPT with shoulder extension to knee lift 2 x 10 (yellow plyo ball) Hooklying lower trunk rotation x 20  Hooklying PPT with march (heels hit first) x 20 Hooklying PPT with alternating arms and legs x 20 Hooklying PPT with bilateral overhead pull downs (red tband with handles; therapist holding tband from behind) 2 x 10 Hooklying PPT with bilateral overhead pull downs with alternating marches (red tband with handles; therapist holding tband from behind) 2 x 10 Seated physio ball roll outs x 10 each of fwd and laterals to both sides (blue physio ball) Seated ball press down on thighs x 10 holding 5 sec each Pallof press with red tband with handles (red band with handles) x 10 each side  PATIENT EDUCATION:  Education details: urge techniques, Access Code: [email or web address] Person educated: Patient Education method: Explanation, Demonstration, Verbal cues, and Handouts Education comprehension: verbalized understanding  HOME EXERCISE PROGRAM: Access Code: [email or web address] - medbridge updated   ASSESSMENT:  CLINICAL IMPRESSION: Pt reported good relief with DN yesterday until this AM when she had to be up and about.  Session focused on STM and myofascial release to lumbar region followed by supine and seated core stabilization training.  She is now able to overlay UE challenges with more prolonged TA indraw over the course of multiple reps.    OBJECTIVE IMPAIRMENTS: decreased coordination, decreased endurance, difficulty walking, decreased ROM, decreased strength, increased muscle spasms, impaired flexibility, impaired tone, postural dysfunction, obesity, and pain.   ACTIVITY LIMITATIONS: standing and continence  PARTICIPATION LIMITATIONS: community activity  PERSONAL FACTORS: 3+ comorbidities: C-section, Lt knee surgery x2; Rt knee x4, appendectomy, GERD, PMH kidney stone, back pain  are also affecting patient's functional outcome.    REHAB POTENTIAL: Good  CLINICAL DECISION MAKING: Evolving/moderate complexity  EVALUATION COMPLEXITY: Moderate   GOALS: Goals reviewed with patient? Yes  SHORT TERM GOALS: Target date: 12/20/22  Ind with urge technique Baseline: Goal status: MET  2.  Ind with initial HEP Baseline:  Goal status: MET on 12/24/2022  3.  Report 20% less leakage in the morning Baseline:  Goal status: MET  4. Patient to report pain in low back no greater than 4/10  5. Patient to be able to stand for at least 5 min    LONG TERM GOALS: Target date: 02/14/23  Pt will report 75% less urgency after standing up Baseline: none Goal status: MET  2.  Pt will report no leaking when walking to the bathroom Baseline: no leakage Goal status: MET  3.  Pt will be independent with advanced HEP to maintain improvements made throughout therapy  Baseline:  Goal status: IN PROGRESS  4.  Pt will report 25% reduced low back pain due to improved pelvic floor function and coordination.  Baseline: flare up this week Goal status: IN PROGRESS 5.  Pt will demonstrate improved standing posture due to strength and hip ROM for ability to stand at least 30 minutes at a time Baseline: can stand a few minutes at a time Goal status: IN PROGRESS 6. Patient to report low back and leg pain at no greater than 2/10 Baseline: 8-9/10  Goal status: INITIAL  7. Patient to improve on FOTO to predicted outcome   Goal status: INITIAL  8. Patient to improve TUG and 5 times sit to stand by 2-3 seconds   Goal status: INITIAL  9. Patient to report 85% improvement in overall symptoms    Goal status: INITIAL   PLAN:  PT FREQUENCY: 1-2x/week   PT DURATION: 6 weeks  PLANNED INTERVENTIONS: Therapeutic exercises, Therapeutic activity, Neuromuscular re-education, Balance training, Gait training, Patient/Family education, Self Care, Joint mobilization, Dry Needling, Electrical stimulation, Cryotherapy, Moist heat, Taping,  Biofeedback, Manual therapy, and Re-evaluation  PLAN FOR NEXT SESSION:  dry needling lumbar #3, pogress core in supine and sitting with exhale on exertion with UE overlay  Baruch Merl, PT 01/09/23 10:59 AM   [facility] [address] [place], [place] [address] Phone # [phone] Fax [phone]

## 2023-01-15 ENCOUNTER — Ambulatory Visit: Payer: 59 | Attending: Obstetrics and Gynecology | Admitting: Physical Therapy

## 2023-01-15 DIAGNOSIS — M6281 Muscle weakness (generalized): Secondary | ICD-10-CM | POA: Diagnosis present

## 2023-01-15 DIAGNOSIS — R279 Unspecified lack of coordination: Secondary | ICD-10-CM | POA: Insufficient documentation

## 2023-01-15 DIAGNOSIS — M62838 Other muscle spasm: Secondary | ICD-10-CM | POA: Insufficient documentation

## 2023-01-15 DIAGNOSIS — R293 Abnormal posture: Secondary | ICD-10-CM

## 2023-01-15 DIAGNOSIS — R252 Cramp and spasm: Secondary | ICD-10-CM | POA: Diagnosis present

## 2023-01-15 DIAGNOSIS — M5459 Other low back pain: Secondary | ICD-10-CM | POA: Insufficient documentation

## 2023-01-15 NOTE — Therapy (Signed)
OUTPATIENT PHYSICAL THERAPY TREATMENT NOTE   Patient Name: [name] MRN: [id number] DOB:[birth year], 62 y.o., female Today's Date: 01/15/2023  END OF SESSION:  PT End of Session - 01/15/23 0915     Visit Number 9    Date for PT Re-Evaluation 02/14/23    Authorization Type [employer]    Progress Note Due on Visit 10    PT Start Time 0800    PT Stop Time 0841    PT Time Calculation (min) 41 min    Activity Tolerance Patient tolerated treatment well    Behavior During Therapy [facility] for tasks assessed/performed                  Past Medical History:  Diagnosis Date   ALLERGIC RHINITIS 10/14/2008   ANXIETY 10/14/2008   ASTHMA 10/14/2008   DEGENERATIVE JOINT DISEASE 10/14/2008   GERD 10/14/2008   HYPERLIPIDEMIA 10/14/2008   INTERMITTENT VERTIGO 11/03/2008   NEPHROLITHIASIS, HX OF 10/14/2008   Past Surgical History:  Procedure Laterality Date   APPENDECTOMY     CESAREAN SECTION     s/p left knee surgery     s/p left ulnar nerve surgery     x's 2   s/p nasal surgery     x's 2   s/p right knee surgery     x's 4- Cartilage torn   TONSILLECTOMY     Patient Active Problem List   Diagnosis Date Noted   Vitamin D deficiency 06/20/2022   Infected tick bite of abdominal wall 03/22/2021   Acute sinusitis 03/10/2020   Kidney stones 09/02/2019   Trapezoid ligament sprain 08/30/2019   Cellulitis of right breast 07/03/2018   Bilateral leg pain 05/01/2018   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   Primary osteoarthritis of both knees 04/23/2017   Chest pain 10/17/2016   Essential hypertension 10/17/2016   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   Macular degeneration 06/29/2016   Recurrent headache 06/29/2016   Hyperglycemia 06/15/2016   MRSA exposure 06/10/2016   Cervical disc disorder 05/11/2016   Acute bronchitis 11/03/2014   Wheezing 11/03/2014   Myalgia 10/06/2014   Fever in adult 10/06/2014   Muscle strain of right gluteal region 06/22/2014   Greater trochanteric  bursitis of right hip 05/17/2014   Lateral epicondylitis of right elbow 06/24/2013   Chronic pain 10/13/2012   Vertigo 05/09/2012   Encounter for well adult exam with abnormal findings 11/11/2011   INTERMITTENT VERTIGO 11/03/2008   HLD (hyperlipidemia) 10/14/2008   Anxiety state 10/14/2008   Allergic rhinitis 10/14/2008   Asthma 10/14/2008   GERD 10/14/2008   NEPHROLITHIASIS, HX OF 10/14/2008    PCP: Biagio Borg, MD  REFERRING PROVIDER:   Tyson Dense, MD   REFERRING DIAG: N39.3 (ICD-10-CM) - Stress incontinence (female) (female)   THERAPY DIAG:  Muscle weakness (generalized)  Cramp and spasm  Abnormal posture  Other low back pain  Other muscle spasm  Unspecified lack of coordination  Rationale for Evaluation and Treatment: Rehabilitation  ONSET DATE: a couple of years  SUBJECTIVE:  SUBJECTIVE STATEMENT: The DN helped until this morning when I got up and started moving around.    Fluid intake: Yes: 2 bottles of water and pepsi and/or tea with meals    PAIN:  PAIN:  Are you having pain? Yes NPRS scale: 8.5/10 Pain location: low back more on the right side Pain description: sharp and aching  Aggravating factors: whatever we did last time Relieving factors: medication, rest, massage    PRECAUTIONS: None  WEIGHT BEARING RESTRICTIONS: No  FALLS:  Has patient fallen in last 6 months? No  LIVING ENVIRONMENT: Lives with: lives with their family and daughter and her husband Lives in: House/apartment   OCCUPATION: retired  PLOF: Independent  PATIENT GOALS: not have extreme urgency and leakage managed           12/18/22: to be able to control back pain and be able to exercise so that she can lose weight in preparation for her knee surgery  PERTINENT HISTORY:   C-section, Lt knee surgery x2; Rt knee x4, appendectomy, GERD, PMH kidney stone, back pain Sexual abuse: No  BOWEL MOVEMENT: Pain with bowel movement: No Type of bowel movement:Type (Bristol Stool Scale) loose recently, Frequency every other day, and Strain Yes Fully empty rectum: Yes:   Leakage: No Pads: No Fiber supplement: No  URINATION: Pain with urination: No Fully empty bladder: Yes:   Stream: Strong Urgency: Yes:   Frequency: 3 hours Leakage: Urge to void and Coughing Pads: No   INTERCOURSE: Pain with intercourse: Not currently active  PREGNANCY: C-section deliveries 1   PROLAPSE: None   OBJECTIVE:   DIAGNOSTIC FINDINGS: MRI 12/05/22 IMPRESSION: 1. Large partially calcified inferiorly migrated disc extrusion at L1-L2 resulting in moderate to severe spinal canal stenosis with right subarticular zone effacement and likely impingement of the traversing right L2 nerve root, also present on the CT abdomen/pelvis from 2019. 2. Advanced facet arthropathy at L3-L4 and L4-L5 with associated effusions which may reflect a source of pain. 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 3. Disc protrusions and facet arthropathy at T11-T12 and T12-L1 resulting in mild-to-moderate spinal canal stenosis at T11-T12 and left subarticular zone narrowing without evidence of frank nerve root impingement at T12-L1.   PATIENT SURVEYS:  12/24/2022:  FOTO 53% (projected 62% by visit 11)  COGNITION: Overall cognitive status: Within functional limits for tasks assessed     SENSATION: MUSCLE LENGTH: Hamstrings: WFL but limited to approx 50-60 degrees bilaterally which would affect Lumbar spine Positive Thomas test bilaterally with significant lack of knee extension   LUMBAR SPECIAL TESTS:  Straight leg  raise test: Negative  FUNCTIONAL TESTS:  Eval: Single leg - has lateral lean and needs UE assist due to knee pain and decreased mobility  12/24/2022: 5 times sit to stand:  28.55 sec with UE use required TUG: 14.6 sec  GAIT:  Comments: lateral weight shift, flexed trunk, decreased knee ext and no heel strike 12/18/22: antalgic, waddle gait and same as above  POSTURE: increased lumbar lordosis, anterior pelvic tilt, flexed trunk , weight shift right, weight shift left, and no heel strike knee flexed to about 10 deg  PELVIC ALIGNMENT:  LUMBARAROM/PROM:   All WNL with pain in extension  LOWER EXTREMITY ROM:  passive ROM Right eval Left eval  Hip flexion 75% 50%  Hip extension 50% 50%  Hip abduction  Hip adduction    Hip internal rotation 50% 50%  Hip external rotation 50% 50%  Knee flexion 60 pain 60 pain  Knee extension -10 pain -10 pain  Ankle dorsiflexion    Ankle plantarflexion    Ankle inversion    Ankle eversion     (Blank rows = not tested)  LOWER EXTREMITY MMT:  MMT Right eval Left eval  Hip flexion 4/5   Hip extension    Hip abduction 5/5 5/5  Hip adduction 5/5 5/5  Hip internal rotation    Hip external rotation    Knee flexion    Knee extension    Ankle dorsiflexion    Ankle plantarflexion    Ankle inversion    Ankle eversion     PALPATION:   General  adductors tight                External Perineal Exam tight adductors attachment                             Internal Pelvic Floor levators stronger on the Rt  Patient confirms identification and approves PT to assess internal pelvic floor and treatment Yes  PELVIC MMT:   MMT eval  Vaginal 3/5 x 3 reps; hold 5 then tone is increased very slow to relax after contracting  Internal Anal Sphincter   External Anal Sphincter   Puborectalis   Diastasis Recti   (Blank rows = not tested)        TONE: High Rt>Lt   PROLAPSE: no  TODAY'S TREATMENT:                                                                                                                               DATE: 01/15/23 Prone STM and myofascial release bil lumbar and upper gluteals, gentle sacral distraction from L5 Gr II/III Trigger Point Dry-Needling  Treatment instructions: Expect mild to moderate muscle soreness. S/S of pneumothorax if dry needled over a lung field, and to seek immediate medical attention should they occur. Patient verbalized understanding of these instructions and education.  Patient Consent Given: Yes Education handout provided: Previously provided Muscles treated: lumbar multifidi and  Electrical stimulation performed: No Parameters: N/A Treatment response/outcome: increased soft tissue length and twitch respons  Supine with knee bolster press up 6lb Supine 6lb overhead Supine TA indraw with green band ext 15 bil Supine green band isometric abduction making and arc slowly up and down - longer transversus abdominus holds Supine green band horiz abd 3x10 with focus on single longer hold TA indraw with each set Seated in chair: red tband bil shoulder ext and row 1x10 each, red plyoball single arm lift d2 flexion cross body 1x5 each UE, red plyoball chest press x10 Standing at counter and rounding into cat - 10x  DATE: 01/09/23 Prone STM and myofascial release bil lumbar and upper gluteals, gentle sacral distraction from L5  Gr II/III Supine with knee bolster slow diaphragmatic inhale with relaxed exhale x 8 cycles Supine with knee bolster slow diaphragmatic inhale with pursed lip exhale x 8 cycles w/ TA indraw Supine TA indraw with red physioball press with counting aloud to 5 with each hold x 8 cycles Supine red weighted plyoball lifts to 90 deg with TA indraw: 4 rounds of 5 lifts (working on longer TA holds) Supine green band horiz abd 3x10 with focus on single longer hold TA indraw with each set Seated in chair: red tband bil shoulder ext and row 1x10 each, red plyoball single arm lift d2  flexion cross body 1x5 each UE, red plyoball chest press x10  DATE: 01/08/23  Neuro re-ed: ongoing cueing for exhale on  abdominal indraw within therex, TC on thoracic and lumbar paraspinals to stay relaxed with exhale and abdominal engaged, avoiding bulging abdomen - needing much less cueing in supine today Supine with head slightly elevated and LE on roll for knee in flexion - pball pressing down, pball lifting up, green band horizontal abduction, opposite UE/LE Seated pressing on pillows exhale with abdominal activation  Manual: Lumbar and thoraicc paraspinals STM and cupping Trigger Point Dry-Needling  Treatment instructions: Expect mild to moderate muscle soreness. S/S of pneumothorax if dry needled over a lung field, and to seek immediate medical attention should they occur. Patient verbalized understanding of these instructions and education.  Patient Consent Given: Yes Education handout provided: Previously provided Muscles treated: lumbar multifidi Electrical stimulation performed: No Parameters: N/A Treatment response/outcome: increased soft tissue length    PATIENT EDUCATION:  Education details: urge techniques, Access Code: [email or web address] Person educated: Patient Education method: Explanation, Demonstration, Verbal cues, and Handouts Education comprehension: verbalized understanding  HOME EXERCISE PROGRAM: Access Code: [email or web address] - medbridge updated   ASSESSMENT:  CLINICAL IMPRESSION: Pt responded well to dry needling and felt more relaxed after today's session.  Pt is overall 30% improved with back pain  Pt has met several long term goals but still limited by pain.   Pt was given stretch to work on improved soft tissue length.  She will benefit from skilled PT to continue to work on core strength and longer holds as she continues to work on endurance for ability to stand for longer periods of time.  OBJECTIVE IMPAIRMENTS: decreased coordination, decreased endurance,  difficulty walking, decreased ROM, decreased strength, increased muscle spasms, impaired flexibility, impaired tone, postural dysfunction, obesity, and pain.   ACTIVITY LIMITATIONS: standing and continence  PARTICIPATION LIMITATIONS: community activity  PERSONAL FACTORS: 3+ comorbidities: C-section, Lt knee surgery x2; Rt knee x4, appendectomy, GERD, PMH kidney stone, back pain  are also affecting patient's functional outcome.   REHAB POTENTIAL: Good  CLINICAL DECISION MAKING: Evolving/moderate complexity  EVALUATION COMPLEXITY: Moderate   GOALS: Goals reviewed with patient? Yes  SHORT TERM GOALS: Target date: 12/20/22  Ind with urge technique Baseline: Goal status: MET  2.  Ind with initial HEP Baseline:  Goal status: MET on 12/24/2022  3.  Report 20% less leakage in the morning Baseline:  Goal status: MET  4. Patient to report pain in low back no greater than 4/10 NOT MET  5. Patient to be able to stand for at least 5 min  NOT MET   LONG TERM GOALS: Target date: 02/14/23  Pt will report 75% less urgency after standing up Baseline: none Goal status: MET  2.  Pt will report no leaking when walking to the bathroom Baseline: no leakage Goal status: MET  3.  Pt will be independent with advanced HEP to maintain improvements made throughout therapy  Baseline:  Goal status: IN PROGRESS  4.  Pt will report 25% reduced low back pain due to improved pelvic floor function and coordination. Baseline: 30% improved low back pain Goal status: MET 5.  Pt will demonstrate improved standing posture due to strength and hip ROM for ability to stand at least 30 minutes at a time Baseline: few minutes at a time Goal status: IN PROGRESS 6. Patient to report low back and leg pain at no greater than 2/10 Baseline: 8-9/10 still happens when doing dishes and things like that  Goal status: INITIAL  7. Patient to improve on FOTO to predicted outcome   Goal status: INITIAL  8.  Patient to improve TUG and 5 times sit to stand by 2-3 seconds   Goal status: INITIAL  9. Patient to report 85% improvement in overall symptoms    Goal status: INITIAL   PLAN:  PT FREQUENCY: 1-2x/week   PT DURATION: 6 weeks  PLANNED INTERVENTIONS: Therapeutic exercises, Therapeutic activity, Neuromuscular re-education, Balance training, Gait training, Patient/Family education, Self Care, Joint mobilization, Dry Needling, Electrical stimulation, Cryotherapy, Moist heat, Taping, Biofeedback, Manual therapy, and Re-evaluation  PLAN FOR NEXT SESSION:  re-test TUG and sit to stand and long term ortho goals; dry needling lumbar #4, pogress core in supine and sitting with exhale on exertion with UE overlay  Gustavus Bryant, PT, DPT 01/15/23 9:16 AM    [facility] [address] [place], [place] [address] Phone # [phone] Fax [phone]

## 2023-01-16 ENCOUNTER — Encounter: Payer: Self-pay | Admitting: Physical Therapy

## 2023-01-16 ENCOUNTER — Ambulatory Visit: Payer: 59 | Admitting: Physical Therapy

## 2023-01-16 DIAGNOSIS — M6281 Muscle weakness (generalized): Secondary | ICD-10-CM | POA: Diagnosis not present

## 2023-01-16 DIAGNOSIS — M5459 Other low back pain: Secondary | ICD-10-CM

## 2023-01-16 DIAGNOSIS — R252 Cramp and spasm: Secondary | ICD-10-CM

## 2023-01-16 DIAGNOSIS — R293 Abnormal posture: Secondary | ICD-10-CM

## 2023-01-16 NOTE — Therapy (Signed)
OUTPATIENT PHYSICAL THERAPY TREATMENT NOTE   Patient Name: [name] MRN: [id number] DOB:November 20, 1960, 62 y.o., female Today's Date: 01/16/2023  END OF SESSION:  PT End of Session - 01/16/23 0845     Visit Number 10    Date for PT Re-Evaluation 02/14/23    Authorization Type [employer]    Progress Note Due on Visit 33    PT Start Time 0845    PT Stop Time 0928    PT Time Calculation (min) 43 min    Activity Tolerance Patient tolerated treatment well    Behavior During Therapy [facility] for tasks assessed/performed                   Past Medical History:  Diagnosis Date   ALLERGIC RHINITIS 10/14/2008   ANXIETY 10/14/2008   ASTHMA 10/14/2008   DEGENERATIVE JOINT DISEASE 10/14/2008   GERD 10/14/2008   HYPERLIPIDEMIA 10/14/2008   INTERMITTENT VERTIGO 11/03/2008   NEPHROLITHIASIS, HX OF 10/14/2008   Past Surgical History:  Procedure Laterality Date   APPENDECTOMY     CESAREAN SECTION     s/p left knee surgery     s/p left ulnar nerve surgery     x's 2   s/p nasal surgery     x's 2   s/p right knee surgery     x's 4- Cartilage torn   TONSILLECTOMY     Patient Active Problem List   Diagnosis Date Noted   Vitamin D deficiency 06/20/2022   Infected tick bite of abdominal wall 03/22/2021   Acute sinusitis 03/10/2020   Kidney stones 09/02/2019   Trapezoid ligament sprain 08/30/2019   Cellulitis of right breast 07/03/2018   Bilateral leg pain 05/01/2018   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   Primary osteoarthritis of both knees 04/23/2017   Chest pain 10/17/2016   Essential hypertension 10/17/2016   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   Macular degeneration 06/29/2016   Recurrent headache 06/29/2016   Hyperglycemia 06/15/2016   MRSA exposure 06/10/2016   Cervical disc disorder 05/11/2016   Acute bronchitis 11/03/2014   Wheezing 11/03/2014   Myalgia 10/06/2014   Fever in adult 10/06/2014   Muscle strain of right gluteal region 06/22/2014   Greater trochanteric  bursitis of right hip 05/17/2014   Lateral epicondylitis of right elbow 06/24/2013   Chronic pain 10/13/2012   Vertigo 05/09/2012   Encounter for well adult exam with abnormal findings 11/11/2011   INTERMITTENT VERTIGO 11/03/2008   HLD (hyperlipidemia) 10/14/2008   Anxiety state 10/14/2008   Allergic rhinitis 10/14/2008   Asthma 10/14/2008   GERD 10/14/2008   NEPHROLITHIASIS, HX OF 10/14/2008    PCP: Biagio Borg, MD  REFERRING PROVIDER:   Tyson Dense, MD   REFERRING DIAG: N39.3 (ICD-10-CM) - Stress incontinence (female) (female)   THERAPY DIAG:  Muscle weakness (generalized)  Cramp and spasm  Abnormal posture  Other low back pain  Rationale for Evaluation and Treatment: Rehabilitation  ONSET DATE: a couple of years  SUBJECTIVE:  SUBJECTIVE STATEMENT: The DN feels so good but relief doesn't last long.  Fluid intake: Yes: 2 bottles of water and pepsi and/or tea with meals    PAIN:  PAIN:  Are you having pain? Yes NPRS scale: 8.5/10 Pain location: low back more on the right side Pain description: sharp and aching  Aggravating factors: whatever we did last time Relieving factors: medication, rest, massage    PRECAUTIONS: None  WEIGHT BEARING RESTRICTIONS: No  FALLS:  Has patient fallen in last 6 months? No  LIVING ENVIRONMENT: Lives with: lives with their family and daughter and her husband Lives in: House/apartment   OCCUPATION: retired  PLOF: Independent  PATIENT GOALS: not have extreme urgency and leakage managed           12/18/22: to be able to control back pain and be able to exercise so that she can lose weight in preparation for her knee surgery  PERTINENT HISTORY:  C-section, Lt knee surgery x2; Rt knee x4, appendectomy, GERD, PMH kidney stone, back  pain Sexual abuse: No  BOWEL MOVEMENT: Pain with bowel movement: No Type of bowel movement:Type (Bristol Stool Scale) loose recently, Frequency every other day, and Strain Yes Fully empty rectum: Yes:   Leakage: No Pads: No Fiber supplement: No  URINATION: Pain with urination: No Fully empty bladder: Yes:   Stream: Strong Urgency: Yes:   Frequency: 3 hours Leakage: Urge to void and Coughing Pads: No   INTERCOURSE: Pain with intercourse: Not currently active  PREGNANCY: C-section deliveries 1   PROLAPSE: None   OBJECTIVE:   DIAGNOSTIC FINDINGS: MRI 12/05/22 IMPRESSION: 1. Large partially calcified inferiorly migrated disc extrusion at L1-L2 resulting in moderate to severe spinal canal stenosis with right subarticular zone effacement and likely impingement of the traversing right L2 nerve root, also present on the CT abdomen/pelvis from 2019. 2. Advanced facet arthropathy at L3-L4 and L4-L5 with associated effusions which may reflect a source of pain. 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 3. Disc protrusions and facet arthropathy at T11-T12 and T12-L1 resulting in mild-to-moderate spinal canal stenosis at T11-T12 and left subarticular zone narrowing without evidence of frank nerve root impingement at T12-L1.   PATIENT SURVEYS:  12/24/2022:  FOTO 53% (projected 62% by visit 11)  COGNITION: Overall cognitive status: Within functional limits for tasks assessed     SENSATION: MUSCLE LENGTH: Hamstrings: WFL but limited to approx 50-60 degrees bilaterally which would affect Lumbar spine Positive Thomas test bilaterally with significant lack of knee extension   LUMBAR SPECIAL TESTS:  Straight leg raise test: Negative  FUNCTIONAL TESTS:  Eval: Single leg - has lateral lean and needs  UE assist due to knee pain and decreased mobility  12/24/2022: 5 times sit to stand:  28.55 sec with UE use required TUG: 14.6 sec  01/15/22:  5x sit to stand: 23 sec with UE support TUG: 13 sec  GAIT:  Comments: lateral weight shift, flexed trunk, decreased knee ext and no heel strike 12/18/22: antalgic, waddle gait and same as above  POSTURE: increased lumbar lordosis, anterior pelvic tilt, flexed trunk , weight shift right, weight shift left, and no heel strike knee flexed to about 10 deg  PELVIC ALIGNMENT:  LUMBARAROM/PROM:   All WNL with pain in extension  LOWER EXTREMITY ROM:  passive ROM Right eval Left eval  Hip flexion 75% 50%  Hip  extension 50% 50%  Hip abduction    Hip adduction    Hip internal rotation 50% 50%  Hip external rotation 50% 50%  Knee flexion 60 pain 60 pain  Knee extension -10 pain -10 pain  Ankle dorsiflexion    Ankle plantarflexion    Ankle inversion    Ankle eversion     (Blank rows = not tested)  LOWER EXTREMITY MMT:  MMT Right eval Left eval  Hip flexion 4/5   Hip extension    Hip abduction 5/5 5/5  Hip adduction 5/5 5/5  Hip internal rotation    Hip external rotation    Knee flexion    Knee extension    Ankle dorsiflexion    Ankle plantarflexion    Ankle inversion    Ankle eversion     PALPATION:   General  adductors tight                External Perineal Exam tight adductors attachment                             Internal Pelvic Floor levators stronger on the Rt  Patient confirms identification and approves PT to assess internal pelvic floor and treatment Yes  PELVIC MMT:   MMT eval  Vaginal 3/5 x 3 reps; hold 5 then tone is increased very slow to relax after contracting  Internal Anal Sphincter   External Anal Sphincter   Puborectalis   Diastasis Recti   (Blank rows = not tested)        TONE: High Rt>Lt   PROLAPSE: no  TODAY'S TREATMENT:                                                                                                                               DATE: 01/16/23 5x sit to stand: 23 sec with UE support TUG: 13.5 sec Supine knees on bolster bil UE 3lb chest press x 15 with TA indraw Supine bil 3lb overhead x15 Supine TA indraw with green band ext 15 bil Supine green band isometric abduction making and arc slowly up and down - longer transversus abdominus holds x15 Seated in chair: green band bil shoulder row 1x15, 3lb cross body diagonal lift without trunk rotation x10 each way Standing at counter and rounding into cat - 10x Manual therapy: Prone STM and myofascial release bil lumbar and upper gluteals, gentle sacral distraction from L5 Gr II/III  DATE: 01/15/23 Prone STM and myofascial release bil lumbar and upper gluteals, gentle sacral distraction from L5 Gr II/III Trigger Point Dry-Needling  Treatment instructions: Expect mild to moderate muscle soreness. S/S of pneumothorax if dry needled over a lung field, and to seek immediate medical attention should they occur. Patient verbalized understanding of these instructions and education.  Patient Consent Given: Yes Education handout provided: Previously provided Muscles treated: lumbar multifidi and  Electrical stimulation performed: No Parameters: N/A Treatment  response/outcome: increased soft tissue length and twitch respons  Supine with knee bolster press up 6lb Supine 6lb overhead Supine TA indraw with green band ext 15 bil Supine green band isometric abduction making and arc slowly up and down - longer transversus abdominus holds Supine green band horiz abd 3x10 with focus on single longer hold TA indraw with each set Seated in chair: red tband bil shoulder ext and row 1x10 each, red plyoball single arm lift d2 flexion cross body 1x5 each UE, red plyoball chest press x10 Standing at counter and rounding into cat - 10x  DATE: 01/09/23 Prone STM and myofascial release bil lumbar and upper gluteals, gentle sacral  distraction from L5 Gr II/III Supine with knee bolster slow diaphragmatic inhale with relaxed exhale x 8 cycles Supine with knee bolster slow diaphragmatic inhale with pursed lip exhale x 8 cycles w/ TA indraw Supine TA indraw with red physioball press with counting aloud to 5 with each hold x 8 cycles Supine red weighted plyoball lifts to 90 deg with TA indraw: 4 rounds of 5 lifts (working on longer TA holds) Supine green band horiz abd 3x10 with focus on single longer hold TA indraw with each set Seated in chair: red tband bil shoulder ext and row 1x10 each, red plyoball single arm lift d2 flexion cross body 1x5 each UE, red plyoball chest press x10  DATE: 01/08/23  Neuro re-ed: ongoing cueing for exhale on  abdominal indraw within therex, TC on thoracic and lumbar paraspinals to stay relaxed with exhale and abdominal engaged, avoiding bulging abdomen - needing much less cueing in supine today Supine with head slightly elevated and LE on roll for knee in flexion - pball pressing down, pball lifting up, green band horizontal abduction, opposite UE/LE Seated pressing on pillows exhale with abdominal activation  Manual: Lumbar and thoraicc paraspinals STM and cupping Trigger Point Dry-Needling  Treatment instructions: Expect mild to moderate muscle soreness. S/S of pneumothorax if dry needled over a lung field, and to seek immediate medical attention should they occur. Patient verbalized understanding of these instructions and education.  Patient Consent Given: Yes Education handout provided: Previously provided Muscles treated: lumbar multifidi Electrical stimulation performed: No Parameters: N/A Treatment response/outcome: increased soft tissue length    PATIENT EDUCATION:  Education details: urge techniques, Access Code: [email or web address] Person educated: Patient Education method: Explanation, Demonstration, Verbal cues, and Handouts Education comprehension: verbalized understanding  HOME  EXERCISE PROGRAM: Access Code: [email or web address] - medbridge updated   ASSESSMENT:  CLINICAL IMPRESSION: Pt gets short term relief from DN and manual techniques.  She shows improved deep core activation with UE overlay with lifting, pushing, pulling therex.  She met 5x sit to stand today, dropping 5 sec from initial test.  Pt is overall 30% improved with back pain  Pt still limited by pain for prolonged standing and walking.  She will benefit from skilled PT to continue to work on core strength and longer holds as she continues to work on endurance for ability to stand for longer periods of time.  OBJECTIVE IMPAIRMENTS: decreased coordination, decreased endurance, difficulty walking, decreased ROM, decreased strength, increased muscle spasms, impaired flexibility, impaired tone, postural dysfunction, obesity, and pain.   ACTIVITY LIMITATIONS: standing and continence  PARTICIPATION LIMITATIONS: community activity  PERSONAL FACTORS: 3+ comorbidities: C-section, Lt knee surgery x2; Rt knee x4, appendectomy, GERD, PMH kidney stone, back pain  are also affecting patient's functional outcome.   REHAB POTENTIAL: Good  CLINICAL DECISION MAKING: Evolving/moderate complexity  EVALUATION COMPLEXITY:  Moderate   GOALS: Goals reviewed with patient? Yes  SHORT TERM GOALS: Target date: 12/20/22  Ind with urge technique Baseline: Goal status: MET  2.  Ind with initial HEP Baseline:  Goal status: MET on 12/24/2022  3.  Report 20% less leakage in the morning Baseline:  Goal status: MET  4. Patient to report pain in low back no greater than 4/10 NOT MET  5. Patient to be able to stand for at least 5 min  NOT MET   LONG TERM GOALS: Target date: 02/14/23  Pt will report 75% less urgency after standing up Baseline: none Goal status: MET  2.  Pt will report no leaking when walking to the bathroom Baseline: no leakage Goal status: MET  3.  Pt will be independent with advanced HEP to maintain  improvements made throughout therapy  Baseline:  Goal status: IN PROGRESS  4.  Pt will report 25% reduced low back pain due to improved pelvic floor function and coordination. Baseline: 30% improved low back pain Goal status: MET 5.  Pt will demonstrate improved standing posture due to strength and hip ROM for ability to stand at least 30 minutes at a time Baseline: few minutes at a time Goal status: IN PROGRESS 6. Patient to report low back and leg pain at no greater than 2/10 Baseline: 8-9/10 still happens when doing dishes and things like that  Goal status: INITIAL  7. Patient to improve on FOTO to predicted outcome   Goal status: INITIAL  8. Patient to improve TUG and 5 times sit to stand by 2-3 seconds   Goal status: met for 5x sit to stand, partially met for TUG (1.5 sec drop)  9. Patient to report 85% improvement in overall symptoms    Goal status: ongoing, 30% improved to date   PLAN:  PT FREQUENCY: 1-2x/week   PT DURATION: 6 weeks  PLANNED INTERVENTIONS: Therapeutic exercises, Therapeutic activity, Neuromuscular re-education, Balance training, Gait training, Patient/Family education, Self Care, Joint mobilization, Dry Needling, Electrical stimulation, Cryotherapy, Moist heat, Taping, Biofeedback, Manual therapy, and Re-evaluation  PLAN FOR NEXT SESSION:  re-test TUG again in a few visits, dry needling lumbar #4, pogress core in supine and sitting with exhale on exertion with UE overlay  Baruch Merl, PT 01/16/23 9:28 AM     [facility] [address] [place], [place] [address] Phone # [phone] Fax [phone]

## 2023-01-21 ENCOUNTER — Ambulatory Visit: Payer: 59 | Admitting: Rehabilitative and Restorative Service Providers"

## 2023-01-21 ENCOUNTER — Encounter: Payer: Self-pay | Admitting: Rehabilitative and Restorative Service Providers"

## 2023-01-21 DIAGNOSIS — M6281 Muscle weakness (generalized): Secondary | ICD-10-CM

## 2023-01-21 DIAGNOSIS — M5459 Other low back pain: Secondary | ICD-10-CM

## 2023-01-21 DIAGNOSIS — R252 Cramp and spasm: Secondary | ICD-10-CM

## 2023-01-21 DIAGNOSIS — R293 Abnormal posture: Secondary | ICD-10-CM

## 2023-01-21 NOTE — Therapy (Signed)
OUTPATIENT PHYSICAL THERAPY TREATMENT NOTE   Patient Name: [name] MRN: [id number] DOB:[birth year], 62 y.o., female Today's Date: 01/21/2023  END OF SESSION:  PT End of Session - 01/21/23 0802     Visit Number 11    Date for PT Re-Evaluation 02/14/23    Authorization Type [employer]    Progress Note Due on Visit 20    PT Start Time 0800    PT Stop Time 0840    PT Time Calculation (min) 40 min    Activity Tolerance Patient tolerated treatment well    Behavior During Therapy WFL for tasks assessed/performed                   Past Medical History:  Diagnosis Date   ALLERGIC RHINITIS 10/14/2008   ANXIETY 10/14/2008   ASTHMA 10/14/2008   DEGENERATIVE JOINT DISEASE 10/14/2008   GERD 10/14/2008   HYPERLIPIDEMIA 10/14/2008   INTERMITTENT VERTIGO 11/03/2008   NEPHROLITHIASIS, HX OF 10/14/2008   Past Surgical History:  Procedure Laterality Date   APPENDECTOMY     CESAREAN SECTION     s/p left knee surgery     s/p left ulnar nerve surgery     x's 2   s/p nasal surgery     x's 2   s/p right knee surgery     x's 4- Cartilage torn   TONSILLECTOMY     Patient Active Problem List   Diagnosis Date Noted   Vitamin D deficiency 06/20/2022   Infected tick bite of abdominal wall 03/22/2021   Acute sinusitis 03/10/2020   Kidney stones 09/02/2019   Trapezoid ligament sprain 08/30/2019   Cellulitis of right breast 07/03/2018   Bilateral leg pain 05/01/2018   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   Primary osteoarthritis of both knees 04/23/2017   Chest pain 10/17/2016   Essential hypertension 10/17/2016   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   Macular degeneration 06/29/2016   Recurrent headache 06/29/2016   Hyperglycemia 06/15/2016   MRSA exposure 06/10/2016   Cervical disc disorder 05/11/2016   Acute bronchitis 11/03/2014   Wheezing 11/03/2014   Myalgia 10/06/2014   Fever in adult 10/06/2014   Muscle strain of right gluteal region 06/22/2014   Greater trochanteric  bursitis of right hip 05/17/2014   Lateral epicondylitis of right elbow 06/24/2013   Chronic pain 10/13/2012   Vertigo 05/09/2012   Encounter for well adult exam with abnormal findings 11/11/2011   INTERMITTENT VERTIGO 11/03/2008   HLD (hyperlipidemia) 10/14/2008   Anxiety state 10/14/2008   Allergic rhinitis 10/14/2008   Asthma 10/14/2008   GERD 10/14/2008   NEPHROLITHIASIS, HX OF 10/14/2008    PCP: Biagio Borg, MD  REFERRING PROVIDER:   Tyson Dense, MD   REFERRING DIAG: N39.3 (ICD-10-CM) - Stress incontinence (female) (female)   THERAPY DIAG:  Muscle weakness (generalized)  Cramp and spasm  Abnormal posture  Other low back pain  Rationale for Evaluation and Treatment: Rehabilitation  ONSET DATE: a couple of years  SUBJECTIVE:  SUBJECTIVE STATEMENT: Pt reports continued increased pain.  Fluid intake: Yes: 2 bottles of water and pepsi and/or tea with meals    PAIN:  PAIN:  Are you having pain? Yes NPRS scale: 8/10 Pain location: low back more on the right side Pain description: sharp and aching  Aggravating factors: whatever we did last time Relieving factors: medication, rest, massage    PRECAUTIONS: None  WEIGHT BEARING RESTRICTIONS: No  FALLS:  Has patient fallen in last 6 months? No  LIVING ENVIRONMENT: Lives with: lives with their family and daughter and her husband Lives in: House/apartment   OCCUPATION: retired  PLOF: Independent  PATIENT GOALS: not have extreme urgency and leakage managed           12/18/22: to be able to control back pain and be able to exercise so that she can lose weight in preparation for her knee surgery  PERTINENT HISTORY:  C-section, Lt knee surgery x2; Rt knee x4, appendectomy, GERD, PMH kidney stone, back pain Sexual  abuse: No  BOWEL MOVEMENT: Pain with bowel movement: No Type of bowel movement:Type (Bristol Stool Scale) loose recently, Frequency every other day, and Strain Yes Fully empty rectum: Yes:   Leakage: No Pads: No Fiber supplement: No  URINATION: Pain with urination: No Fully empty bladder: Yes:   Stream: Strong Urgency: Yes:   Frequency: 3 hours Leakage: Urge to void and Coughing Pads: No   INTERCOURSE: Pain with intercourse: Not currently active  PREGNANCY: C-section deliveries 1   PROLAPSE: None   OBJECTIVE:   DIAGNOSTIC FINDINGS:  MRI 12/05/22 IMPRESSION: 1. Large partially calcified inferiorly migrated disc extrusion at L1-L2 resulting in moderate to severe spinal canal stenosis with right subarticular zone effacement and likely impingement of the traversing right L2 nerve root, also present on the CT abdomen/pelvis from 2019. 2. Advanced facet arthropathy at L3-L4 and L4-L5 with associated effusions which may reflect a source of pain. 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 3. Disc protrusions and facet arthropathy at T11-T12 and T12-L1 resulting in mild-to-moderate spinal canal stenosis at T11-T12 and left subarticular zone narrowing without evidence of frank nerve root impingement at T12-L1.   PATIENT SURVEYS:  12/24/2022:  FOTO 53% (projected 62% by visit 11)  COGNITION: Overall cognitive status: Within functional limits for tasks assessed     SENSATION: MUSCLE LENGTH: Hamstrings: WFL but limited to approx 50-60 degrees bilaterally which would affect Lumbar spine Positive Thomas test bilaterally with significant lack of knee extension   LUMBAR SPECIAL TESTS:  Straight leg raise test: Negative  FUNCTIONAL TESTS:  Eval: Single leg - has lateral lean and needs UE assist  due to knee pain and decreased mobility  12/24/2022: 5 times sit to stand:  28.55 sec with UE use required TUG: 14.6 sec  01/15/22:  5x sit to stand: 23 sec with UE support TUG: 13 sec  01/21/2023: 3 minute walk test:  463 ft with RPE of 6/10  GAIT:  Comments: lateral weight shift, flexed trunk, decreased knee ext and no heel strike 12/18/22: antalgic, waddle gait and same as above  POSTURE: increased lumbar lordosis, anterior pelvic tilt, flexed trunk , weight shift right, weight shift left, and no heel strike knee flexed to about 10 deg  PELVIC ALIGNMENT:  LUMBARAROM/PROM:   All WNL with pain in extension  LOWER EXTREMITY ROM:  passive ROM Right eval  Left eval  Hip flexion 75% 50%  Hip extension 50% 50%  Hip abduction    Hip adduction    Hip internal rotation 50% 50%  Hip external rotation 50% 50%  Knee flexion 60 pain 60 pain  Knee extension -10 pain -10 pain  Ankle dorsiflexion    Ankle plantarflexion    Ankle inversion    Ankle eversion     (Blank rows = not tested)  LOWER EXTREMITY MMT:  MMT Right eval Left eval  Hip flexion 4/5   Hip extension    Hip abduction 5/5 5/5  Hip adduction 5/5 5/5  Hip internal rotation    Hip external rotation    Knee flexion    Knee extension    Ankle dorsiflexion    Ankle plantarflexion    Ankle inversion    Ankle eversion     PALPATION:   General  adductors tight                External Perineal Exam tight adductors attachment                             Internal Pelvic Floor levators stronger on the Rt  Patient confirms identification and approves PT to assess internal pelvic floor and treatment Yes  PELVIC MMT:   MMT eval  Vaginal 3/5 x 3 reps; hold 5 then tone is increased very slow to relax after contracting  Internal Anal Sphincter   External Anal Sphincter   Puborectalis   Diastasis Recti   (Blank rows = not tested)        TONE: High Rt>Lt   PROLAPSE: no  TODAY'S TREATMENT:                                                                                                                                DATE: 01/21/2023 Nustep level 2 x6 min with PT present to discuss status Supine knees on bolster bil UE 3lb chest press 2x10 with TA indraw Supine shoulder flexion with 3# 2x10 Supine shoulder horizontal abduction with green tband with TA contraction 2x10 Supine shoulder ER with green tband 2x10 Supine green band isometric abduction making and arc slowly up and down - longer transversus abdominus holds 2x10 Seated shoulder rows and shoulder extension with green tband 2x10 each bilat Seated green pball rollout 2x5 with 10 sec hold 3 minute walk test:   463 ft with RPE of 6/10 Prone:  Addaday to bilateral thoracic and lumbar paraspinals, glutes/piriformis, IT band, hamstrings   DATE: 01/16/23 5x sit to stand: 23 sec with UE support TUG: 13.5 sec Supine knees on bolster bil UE 3lb chest press x 15 with TA indraw Supine bil 3lb overhead x15 Supine TA indraw with green band ext 15 bil Supine green band isometric abduction making and arc slowly up and down - longer transversus abdominus holds x15 Seated in chair: green  band bil shoulder row 1x15, 3lb cross body diagonal lift without trunk rotation x10 each way Standing at counter and rounding into cat - 10x Manual therapy: Prone STM and myofascial release bil lumbar and upper gluteals, gentle sacral distraction from L5 Gr II/III  DATE: 01/15/23 Prone STM and myofascial release bil lumbar and upper gluteals, gentle sacral distraction from L5 Gr II/III Trigger Point Dry-Needling  Treatment instructions: Expect mild to moderate muscle soreness. S/S of pneumothorax if dry needled over a lung field, and to seek immediate medical attention should they occur. Patient verbalized understanding of these instructions and education.  Patient Consent Given: Yes Education handout provided: Previously provided Muscles treated: lumbar multifidi and   Electrical stimulation performed: No Parameters: N/A Treatment response/outcome: increased soft tissue length and twitch respons  Supine with knee bolster press up 6lb Supine 6lb overhead Supine TA indraw with green band ext 15 bil Supine green band isometric abduction making and arc slowly up and down - longer transversus abdominus holds Supine green band horiz abd 3x10 with focus on single longer hold TA indraw with each set Seated in chair: red tband bil shoulder ext and row 1x10 each, red plyoball single arm lift d2 flexion cross body 1x5 each UE, red plyoball chest press x10 Standing at counter and rounding into cat - 10x    PATIENT EDUCATION:  Education details: urge techniques, Access Code: [id number] Person educated: Patient Education method: Explanation, Demonstration, Verbal cues, and Handouts Education comprehension: verbalized understanding  HOME EXERCISE PROGRAM: Access Code: [id number] - medbridge updated   ASSESSMENT:  CLINICAL IMPRESSION: Ms Vetrano presents with pain of 8/10 stating pain primarily on her right side, but some on her left side.  Patient able to participate in 3 minute walk test, but has RPE of 6/10.  Patient states that she typically utilizes the shopping cart when she is walking for increased time at the grocery store.  Patient reported decreased pain with soft tissue mobilization using addaday to 6/10.  Patient continues to require skilled PT to progress towards goal related activities.  OBJECTIVE IMPAIRMENTS: decreased coordination, decreased endurance, difficulty walking, decreased ROM, decreased strength, increased muscle spasms, impaired flexibility, impaired tone, postural dysfunction, obesity, and pain.   ACTIVITY LIMITATIONS: standing and continence  PARTICIPATION LIMITATIONS: community activity  PERSONAL FACTORS: 3+ comorbidities: C-section, Lt knee surgery x2; Rt knee x4, appendectomy, GERD, PMH kidney stone, back pain  are also affecting  patient's functional outcome.   REHAB POTENTIAL: Good  CLINICAL DECISION MAKING: Evolving/moderate complexity  EVALUATION COMPLEXITY: Moderate   GOALS: Goals reviewed with patient? Yes  SHORT TERM GOALS: Target date: 12/20/22  Ind with urge technique Baseline: Goal status: MET  2.  Ind with initial HEP Baseline:  Goal status: MET on 12/24/2022  3.  Report 20% less leakage in the morning Baseline:  Goal status: MET  4. Patient to report pain in low back no greater than 4/10 NOT MET  5. Patient to be able to stand for at least 5 min  NOT MET   LONG TERM GOALS: Target date: 02/14/23  Pt will report 75% less urgency after standing up Baseline: none Goal status: MET  2.  Pt will report no leaking when walking to the bathroom Baseline: no leakage Goal status: MET  3.  Pt will be independent with advanced HEP to maintain improvements made throughout therapy  Baseline:  Goal status: IN PROGRESS  4.  Pt will report 25% reduced low back pain due to improved pelvic floor function and  coordination. Baseline: 30% improved low back pain Goal status: MET  5.  Pt will demonstrate improved standing posture due to strength and hip ROM for ability to stand at least 30 minutes at a time Baseline: few minutes at a time Goal status: IN PROGRESS  6. Patient to report low back and leg pain at no greater than 2/10 Baseline: 8-9/10 still happens when doing dishes and things like that  Goal status: INITIAL  7. Patient to improve on FOTO to predicted outcome   Goal status: INITIAL  8. Patient to improve TUG and 5 times sit to stand by 2-3 seconds   Goal status: met for 5x sit to stand, partially met for TUG (1.5 sec drop)  9. Patient to report 85% improvement in overall symptoms    Goal status: ongoing, 30% improved to date   PLAN:  PT FREQUENCY: 1-2x/week   PT DURATION: 6 weeks  PLANNED INTERVENTIONS: Therapeutic exercises, Therapeutic activity, Neuromuscular  re-education, Balance training, Gait training, Patient/Family education, Self Care, Joint mobilization, Dry Needling, Electrical stimulation, Cryotherapy, Moist heat, Taping, Biofeedback, Manual therapy, and Re-evaluation  PLAN FOR NEXT SESSION:  re-test TUG again in a few visits, dry needling lumbar #4, pogress core in supine and sitting with exhale on exertion with UE overlay    Juel Burrow, PT 01/21/23 8:47 AM   [address], [place], [place] [address] Phone # [phone] Fax [phone]

## 2023-01-22 ENCOUNTER — Ambulatory Visit: Payer: 59 | Admitting: Physical Therapy

## 2023-01-22 DIAGNOSIS — R252 Cramp and spasm: Secondary | ICD-10-CM

## 2023-01-22 DIAGNOSIS — M6281 Muscle weakness (generalized): Secondary | ICD-10-CM | POA: Diagnosis not present

## 2023-01-22 DIAGNOSIS — M62838 Other muscle spasm: Secondary | ICD-10-CM

## 2023-01-22 DIAGNOSIS — M5459 Other low back pain: Secondary | ICD-10-CM

## 2023-01-22 DIAGNOSIS — R293 Abnormal posture: Secondary | ICD-10-CM

## 2023-01-22 DIAGNOSIS — R279 Unspecified lack of coordination: Secondary | ICD-10-CM

## 2023-01-22 NOTE — Therapy (Signed)
OUTPATIENT PHYSICAL THERAPY TREATMENT NOTE   Patient Name: [name] MRN: [id number] DOB:11-09-1961, 62 y.o., female Today's Date: 01/22/2023  END OF SESSION:  PT End of Session - 01/22/23 0754     Visit Number 12    Date for PT Re-Evaluation 02/14/23    Authorization Type [employer]    Progress Note Due on Visit 58    PT Start Time 0757    PT Stop Time 0835    PT Time Calculation (min) 38 min    Activity Tolerance Patient tolerated treatment well    Behavior During Therapy [facility] for tasks assessed/performed                    Past Medical History:  Diagnosis Date   ALLERGIC RHINITIS 10/14/2008   ANXIETY 10/14/2008   ASTHMA 10/14/2008   DEGENERATIVE JOINT DISEASE 10/14/2008   GERD 10/14/2008   HYPERLIPIDEMIA 10/14/2008   INTERMITTENT VERTIGO 11/03/2008   NEPHROLITHIASIS, HX OF 10/14/2008   Past Surgical History:  Procedure Laterality Date   APPENDECTOMY     CESAREAN SECTION     s/p left knee surgery     s/p left ulnar nerve surgery     x's 2   s/p nasal surgery     x's 2   s/p right knee surgery     x's 4- Cartilage torn   TONSILLECTOMY     Patient Active Problem List   Diagnosis Date Noted   Vitamin D deficiency 06/20/2022   Infected tick bite of abdominal wall 03/22/2021   Acute sinusitis 03/10/2020   Kidney stones 09/02/2019   Trapezoid ligament sprain 08/30/2019   Cellulitis of right breast 07/03/2018   Bilateral leg pain 05/01/2018   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   Primary osteoarthritis of both knees 04/23/2017   Chest pain 10/17/2016   Essential hypertension 10/17/2016   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   Macular degeneration 06/29/2016   Recurrent headache 06/29/2016   Hyperglycemia 06/15/2016   MRSA exposure 06/10/2016   Cervical disc disorder 05/11/2016   Acute bronchitis 11/03/2014   Wheezing 11/03/2014   Myalgia 10/06/2014   Fever in adult 10/06/2014   Muscle strain of right gluteal region 06/22/2014   Greater trochanteric  bursitis of right hip 05/17/2014   Lateral epicondylitis of right elbow 06/24/2013   Chronic pain 10/13/2012   Vertigo 05/09/2012   Encounter for well adult exam with abnormal findings 11/11/2011   INTERMITTENT VERTIGO 11/03/2008   HLD (hyperlipidemia) 10/14/2008   Anxiety state 10/14/2008   Allergic rhinitis 10/14/2008   Asthma 10/14/2008   GERD 10/14/2008   NEPHROLITHIASIS, HX OF 10/14/2008    PCP: Biagio Borg, MD  REFERRING PROVIDER:   Tyson Dense, MD   REFERRING DIAG: N39.3 (ICD-10-CM) - Stress incontinence (female) (female)   THERAPY DIAG:  Muscle weakness (generalized)  Cramp and spasm  Abnormal posture  Other low back pain  Other muscle spasm  Unspecified lack of coordination  Rationale for Evaluation and Treatment: Rehabilitation  ONSET DATE: a couple of years  SUBJECTIVE:  SUBJECTIVE STATEMENT: Pt reports no changes after yesterday's appointment and did well with exercises.  Reports she can feel her core working when doing her HEP  Fluid intake: Yes: 2 bottles of water and pepsi and/or tea with meals    PAIN:  PAIN:  Are you having pain? Yes NPRS scale: 7/10 Pain location: low back more on the right side Pain description: sharp and aching  Aggravating factors: whatever we did last time Relieving factors: medication, rest, massage    PRECAUTIONS: None  WEIGHT BEARING RESTRICTIONS: No  FALLS:  Has patient fallen in last 6 months? No  LIVING ENVIRONMENT: Lives with: lives with their family and daughter and her husband Lives in: House/apartment   OCCUPATION: retired  PLOF: Independent  PATIENT GOALS: not have extreme urgency and leakage managed           12/18/22: to be able to control back pain and be able to exercise so that she can lose weight in  preparation for her knee surgery  PERTINENT HISTORY:  C-section, Lt knee surgery x2; Rt knee x4, appendectomy, GERD, PMH kidney stone, back pain Sexual abuse: No  BOWEL MOVEMENT: Pain with bowel movement: No Type of bowel movement:Type (Bristol Stool Scale) loose recently, Frequency every other day, and Strain Yes Fully empty rectum: Yes:   Leakage: No Pads: No Fiber supplement: No  URINATION: Pain with urination: No Fully empty bladder: Yes:   Stream: Strong Urgency: Yes:   Frequency: 3 hours Leakage: Urge to void and Coughing Pads: No   INTERCOURSE: Pain with intercourse: Not currently active  PREGNANCY: C-section deliveries 1   PROLAPSE: None   OBJECTIVE:   DIAGNOSTIC FINDINGS:  MRI 12/05/22 IMPRESSION: 1. Large partially calcified inferiorly migrated disc extrusion at L1-L2 resulting in moderate to severe spinal canal stenosis with right subarticular zone effacement and likely impingement of the traversing right L2 nerve root, also present on the CT abdomen/pelvis from 2019. 2. Advanced facet arthropathy at L3-L4 and L4-L5 with associated effusions which may reflect a source of pain. 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 3. Disc protrusions and facet arthropathy at T11-T12 and T12-L1 resulting in mild-to-moderate spinal canal stenosis at T11-T12 and left subarticular zone narrowing without evidence of frank nerve root impingement at T12-L1.   PATIENT SURVEYS:  12/24/2022:  FOTO 53% (projected 62% by visit 11)  COGNITION: Overall cognitive status: Within functional limits for tasks assessed     SENSATION: MUSCLE LENGTH: Hamstrings: WFL but limited to approx 50-60 degrees bilaterally which would affect Lumbar spine Positive Thomas test bilaterally with significant lack of  knee extension   LUMBAR SPECIAL TESTS:  Straight leg raise test: Negative  FUNCTIONAL TESTS:  Eval: Single leg - has lateral lean and needs UE assist due to knee pain and decreased mobility  12/24/2022: 5 times sit to stand:  28.55 sec with UE use required TUG: 14.6 sec  01/15/22:  5x sit to stand: 23 sec with UE support TUG: 13 sec  01/21/2023: 3 minute walk test:  463 ft with RPE of 6/10  GAIT:  Comments: lateral weight shift, flexed trunk, decreased knee ext and no heel strike 12/18/22: antalgic, waddle gait and same as above  POSTURE: increased lumbar lordosis, anterior pelvic tilt, flexed trunk , weight shift right, weight shift left, and no heel strike knee flexed to about 10 deg  PELVIC ALIGNMENT:  LUMBARAROM/PROM:   All WNL with pain in extension  LOWER EXTREMITY ROM:  passive ROM Right eval Left eval  Hip flexion 75% 50%  Hip extension 50% 50%  Hip abduction    Hip adduction    Hip internal rotation 50% 50%  Hip external rotation 50% 50%  Knee flexion 60 pain 60 pain  Knee extension -10 pain -10 pain  Ankle dorsiflexion    Ankle plantarflexion    Ankle inversion    Ankle eversion     (Blank rows = not tested)  LOWER EXTREMITY MMT:  MMT Right eval Left eval  Hip flexion 4/5   Hip extension    Hip abduction 5/5 5/5  Hip adduction 5/5 5/5  Hip internal rotation    Hip external rotation    Knee flexion    Knee extension    Ankle dorsiflexion    Ankle plantarflexion    Ankle inversion    Ankle eversion     PALPATION:   General  adductors tight                External Perineal Exam tight adductors attachment                             Internal Pelvic Floor levators stronger on the Rt  Patient confirms identification and approves PT to assess internal pelvic floor and treatment Yes  PELVIC MMT:   MMT eval  Vaginal 3/5 x 3 reps; hold 5 then tone is increased very slow to relax after contracting  Internal Anal Sphincter   External Anal  Sphincter   Puborectalis   Diastasis Recti   (Blank rows = not tested)        TONE: High Rt>Lt   PROLAPSE: no  TODAY'S TREATMENT:                                                                                                                              DATE: 01/22/23 Prone STM and myofascial release bil lumbar and Rt piriformis, gentle sacral distraction from L5  Trigger Point Dry-Needling  Treatment instructions: Expect mild to moderate muscle soreness. S/S of pneumothorax if dry needled over a lung field, and to seek immediate medical attention should they occur. Patient verbalized understanding of these instructions and education.  Patient Consent Given: Yes Education handout provided: Previously provided Muscles treated: lumbar multifidi Electrical stimulation performed: No Parameters: N/A Treatment response/outcome: increased soft tissue length and twitch respons Nustep L1 x 6 min with status update Supine with knee bolster press up 4lb in each hand Supine march - small lift LE from bolster Supine TA indraw with green band ext 15 bil Supine band creating isometric horizontal abduction and perform rainbow with UE - 15x Supine green band isometric abduction making and arc slowly up and down - longer transversus abdominus holds Supine ball presses to opposite LE - 10 bil  DATE: 01/21/2023  Nustep level 2 x6 min with PT present to discuss status Supine knees on bolster bil UE 3lb chest press 2x10 with TA indraw Supine shoulder flexion with 3# 2x10 Supine shoulder horizontal abduction with green tband with TA contraction 2x10 Supine shoulder ER with green tband 2x10 Supine green band isometric abduction making and arc slowly up and down - longer transversus abdominus holds 2x10 Seated shoulder rows and shoulder extension with green tband 2x10 each bilat Seated green pball rollout 2x5 with 10 sec hold 3 minute walk test:   463 ft with RPE of 6/10 Prone:  Addaday to bilateral  thoracic and lumbar paraspinals, glutes/piriformis, IT band, hamstrings   DATE: 01/16/23 5x sit to stand: 23 sec with UE support TUG: 13.5 sec Supine knees on bolster bil UE 3lb chest press x 15 with TA indraw Supine bil 3lb overhead x15 Supine TA indraw with green band ext 15 bil Supine green band isometric abduction making and arc slowly up and down - longer transversus abdominus holds x15 Seated in chair: green band bil shoulder row 1x15, 3lb cross body diagonal lift without trunk rotation x10 each way Standing at counter and rounding into cat - 10x Manual therapy: Prone STM and myofascial release bil lumbar and upper gluteals, gentle sacral distraction from L5 Gr II/III   PATIENT EDUCATION:  Education details: urge techniques, Access Code: [email or web address] Person educated: Patient Education method: Explanation, Demonstration, Verbal cues, and Handouts Education comprehension: verbalized understanding  HOME EXERCISE PROGRAM: Access Code: [email or web address]    ASSESSMENT:  CLINICAL IMPRESSION: Ms Zwieg continues to report small improvements and is 30-35% improved overall.  Pt was able to do LE marching and adding more oblique strengthening with opposite UE/LE exercises.  Pt had dry needling #4 today and responds well.  May need up to 2 more dry needling session to see more lasting results.  Patient continues to require skilled PT to progress towards goal related activities.  OBJECTIVE IMPAIRMENTS: decreased coordination, decreased endurance, difficulty walking, decreased ROM, decreased strength, increased muscle spasms, impaired flexibility, impaired tone, postural dysfunction, obesity, and pain.   ACTIVITY LIMITATIONS: standing and continence  PARTICIPATION LIMITATIONS: community activity  PERSONAL FACTORS: 3+ comorbidities: C-section, Lt knee surgery x2; Rt knee x4, appendectomy, GERD, PMH kidney stone, back pain  are also affecting patient's functional outcome.   REHAB POTENTIAL:  Good  CLINICAL DECISION MAKING: Evolving/moderate complexity  EVALUATION COMPLEXITY: Moderate   GOALS: Goals reviewed with patient? Yes  SHORT TERM GOALS: Target date: 12/20/22  Ind with urge technique Baseline: Goal status: MET  2.  Ind with initial HEP Baseline:  Goal status: MET on 12/24/2022  3.  Report 20% less leakage in the morning Baseline:  Goal status: MET  4. Patient to report pain in low back no greater than 4/10 NOT MET  5. Patient to be able to stand for at least 5 min  NOT MET   LONG TERM GOALS: Target date: 02/14/23  Pt will report 75% less urgency after standing up Baseline: none Goal status: MET  2.  Pt will report no leaking when walking to the bathroom Baseline: no leakage Goal status: MET  3.  Pt will be independent with advanced HEP to maintain improvements made throughout therapy  Baseline:  Goal status: IN PROGRESS  4.  Pt will report 25% reduced low back pain due to improved pelvic floor function and coordination. Baseline: 30% improved low back pain Goal status: MET  5.  Pt will demonstrate improved standing posture due to strength  and hip ROM for ability to stand at least 30 minutes at a time Baseline: few minutes at a time Goal status: IN PROGRESS  6. Patient to report low back and leg pain at no greater than 2/10 Baseline: 8-9/10 still happens when doing dishes and things like that  Goal status: INITIAL  7. Patient to improve on FOTO to predicted outcome   Goal status: INITIAL  8. Patient to improve TUG and 5 times sit to stand by 2-3 seconds   Goal status: MET for 5x sit to stand, partially met for TUG (1.5 sec drop)  9. Patient to report 85% improvement in overall symptoms    Goal status: ONGOING, 30-35% improved to date   PLAN:  PT FREQUENCY: 1-2x/week   PT DURATION: 6 weeks  PLANNED INTERVENTIONS: Therapeutic exercises, Therapeutic activity, Neuromuscular re-education, Balance training, Gait training,  Patient/Family education, Self Care, Joint mobilization, Dry Needling, Electrical stimulation, Cryotherapy, Moist heat, Taping, Biofeedback, Manual therapy, and Re-evaluation  PLAN FOR NEXT SESSION:  re-test TUG again in 1-2 visits, dry needling lumbar #5, pogress core in supine and sitting with exhale on exertion with UE overlay    Gustavus Bryant, PT, DPT 01/22/23 8:37 AM    [facility] [address], [place], [place] [address] Phone # [phone] Fax [phone]

## 2023-01-23 ENCOUNTER — Other Ambulatory Visit: Payer: Self-pay | Admitting: Internal Medicine

## 2023-01-28 ENCOUNTER — Encounter: Payer: Self-pay | Admitting: Rehabilitative and Restorative Service Providers"

## 2023-01-28 ENCOUNTER — Ambulatory Visit: Payer: 59 | Admitting: Rehabilitative and Restorative Service Providers"

## 2023-01-28 DIAGNOSIS — M6281 Muscle weakness (generalized): Secondary | ICD-10-CM | POA: Diagnosis not present

## 2023-01-28 DIAGNOSIS — M5459 Other low back pain: Secondary | ICD-10-CM

## 2023-01-28 DIAGNOSIS — R293 Abnormal posture: Secondary | ICD-10-CM

## 2023-01-28 DIAGNOSIS — R252 Cramp and spasm: Secondary | ICD-10-CM

## 2023-01-28 NOTE — Therapy (Signed)
OUTPATIENT PHYSICAL THERAPY TREATMENT NOTE   Patient Name: [name] MRN: [id number] DOB:[birth year], 62 y.o., female Today's Date: 01/28/2023  END OF SESSION:  PT End of Session - 01/28/23 0806     Visit Number 13    Date for PT Re-Evaluation 02/14/23    Authorization Type [employer]    Progress Note Due on Visit 20    PT Start Time 0800    PT Stop Time 0831    PT Time Calculation (min) 31 min    Activity Tolerance Patient tolerated treatment well    Behavior During Therapy WFL for tasks assessed/performed                    Past Medical History:  Diagnosis Date   ALLERGIC RHINITIS 10/14/2008   ANXIETY 10/14/2008   ASTHMA 10/14/2008   DEGENERATIVE JOINT DISEASE 10/14/2008   GERD 10/14/2008   HYPERLIPIDEMIA 10/14/2008   INTERMITTENT VERTIGO 11/03/2008   NEPHROLITHIASIS, HX OF 10/14/2008   Past Surgical History:  Procedure Laterality Date   APPENDECTOMY     CESAREAN SECTION     s/p left knee surgery     s/p left ulnar nerve surgery     x's 2   s/p nasal surgery     x's 2   s/p right knee surgery     x's 4- Cartilage torn   TONSILLECTOMY     Patient Active Problem List   Diagnosis Date Noted   Vitamin D deficiency 06/20/2022   Infected tick bite of abdominal wall 03/22/2021   Acute sinusitis 03/10/2020   Kidney stones 09/02/2019   Trapezoid ligament sprain 08/30/2019   Cellulitis of right breast 07/03/2018   Bilateral leg pain 05/01/2018   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   Primary osteoarthritis of both knees 04/23/2017   Chest pain 10/17/2016   Essential hypertension 10/17/2016   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   Macular degeneration 06/29/2016   Recurrent headache 06/29/2016   Hyperglycemia 06/15/2016   MRSA exposure 06/10/2016   Cervical disc disorder 05/11/2016   Acute bronchitis 11/03/2014   Wheezing 11/03/2014   Myalgia 10/06/2014   Fever in adult 10/06/2014   Muscle strain of right gluteal region 06/22/2014   Greater trochanteric  bursitis of right hip 05/17/2014   Lateral epicondylitis of right elbow 06/24/2013   Chronic pain 10/13/2012   Vertigo 05/09/2012   Encounter for well adult exam with abnormal findings 11/11/2011   INTERMITTENT VERTIGO 11/03/2008   HLD (hyperlipidemia) 10/14/2008   Anxiety state 10/14/2008   Allergic rhinitis 10/14/2008   Asthma 10/14/2008   GERD 10/14/2008   NEPHROLITHIASIS, HX OF 10/14/2008    PCP: Biagio Borg, MD  REFERRING PROVIDER:   Tyson Dense, MD   REFERRING DIAG: N39.3 (ICD-10-CM) - Stress incontinence (female) (female)   THERAPY DIAG:  Muscle weakness (generalized)  Cramp and spasm  Abnormal posture  Other low back pain  Rationale for Evaluation and Treatment: Rehabilitation  ONSET DATE: a couple of years  SUBJECTIVE:  SUBJECTIVE STATEMENT: Pt reports that she is in increased pain today.  States that she mowed the yard on Saturday morning and had some soreness.  States that she had a fall on Saturday night when she caught her toe on curbing at [employer] and fell.  Patient has had increased pain since that time.  "I landed most of my weight on my bad knee."  Patient states that she hit the grass instead of concrete.  Fluid intake: Yes: 2 bottles of water and pepsi and/or tea with meals    PAIN:  PAIN:  Are you having pain? Yes NPRS scale: 9/10 Pain location: low back more on the right side Pain description: sharp and aching  Aggravating factors: whatever we did last time Relieving factors: medication, rest, massage    PRECAUTIONS: None  WEIGHT BEARING RESTRICTIONS: No  FALLS:  Has patient fallen in last 6 months? No  LIVING ENVIRONMENT: Lives with: lives with their family and daughter and her husband Lives in: House/apartment   OCCUPATION:  retired  PLOF: Independent  PATIENT GOALS: not have extreme urgency and leakage managed           12/18/22: to be able to control back pain and be able to exercise so that she can lose weight in preparation for her knee surgery  PERTINENT HISTORY:  C-section, Lt knee surgery x2; Rt knee x4, appendectomy, GERD, PMH kidney stone, back pain Sexual abuse: No  BOWEL MOVEMENT: Pain with bowel movement: No Type of bowel movement:Type (Bristol Stool Scale) loose recently, Frequency every other day, and Strain Yes Fully empty rectum: Yes:   Leakage: No Pads: No Fiber supplement: No  URINATION: Pain with urination: No Fully empty bladder: Yes:   Stream: Strong Urgency: Yes:   Frequency: 3 hours Leakage: Urge to void and Coughing Pads: No   INTERCOURSE: Pain with intercourse: Not currently active  PREGNANCY: C-section deliveries 1   PROLAPSE: None   OBJECTIVE:   DIAGNOSTIC FINDINGS:  MRI 12/05/22 IMPRESSION: 1. Large partially calcified inferiorly migrated disc extrusion at L1-L2 resulting in moderate to severe spinal canal stenosis with right subarticular zone effacement and likely impingement of the traversing right L2 nerve root, also present on the CT abdomen/pelvis from 2019. 2. Advanced facet arthropathy at L3-L4 and L4-L5 with associated effusions which may reflect a source of pain. 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 3. Disc protrusions and facet arthropathy at T11-T12 and T12-L1 resulting in mild-to-moderate spinal canal stenosis at T11-T12 and left subarticular zone narrowing without evidence of frank nerve root impingement at T12-L1.   PATIENT SURVEYS:  12/24/2022:  FOTO 53% (projected 62% by visit 11)  COGNITION: Overall cognitive status: Within functional limits for tasks  assessed     SENSATION: MUSCLE LENGTH: Hamstrings: WFL but limited to approx 50-60 degrees bilaterally which would affect Lumbar spine Positive Thomas test bilaterally with significant lack of knee extension   LUMBAR SPECIAL TESTS:  Straight leg raise test: Negative  FUNCTIONAL TESTS:  Eval: Single leg - has lateral lean and needs UE assist due to knee pain and decreased mobility  12/24/2022: 5 times sit to stand:  28.55 sec with UE use required TUG: 14.6 sec  01/15/22:  5x sit to stand: 23 sec with UE support TUG: 13 sec  01/21/2023: 3 minute walk test:  463 ft with RPE of 6/10  01/28/2023  Timed up and Go (TUG):  13.99 sec Five times sit to/from stand:  17.51 sec with hands pushing up from thighs  GAIT:  Comments: lateral weight shift, flexed trunk, decreased knee ext and no heel strike 12/18/22: antalgic, waddle gait and same as above  POSTURE: increased lumbar lordosis, anterior pelvic tilt, flexed trunk , weight shift right, weight shift left, and no heel strike knee flexed to about 10 deg  PELVIC ALIGNMENT:  LUMBARAROM/PROM:   All WNL with pain in extension  LOWER EXTREMITY ROM:  passive ROM Right eval Left eval  Hip flexion 75% 50%  Hip extension 50% 50%  Hip abduction    Hip adduction    Hip internal rotation 50% 50%  Hip external rotation 50% 50%  Knee flexion 60 pain 60 pain  Knee extension -10 pain -10 pain  Ankle dorsiflexion    Ankle plantarflexion    Ankle inversion    Ankle eversion     (Blank rows = not tested)  LOWER EXTREMITY MMT:  MMT Right eval Left eval  Hip flexion 4/5   Hip extension    Hip abduction 5/5 5/5  Hip adduction 5/5 5/5  Hip internal rotation    Hip external rotation    Knee flexion    Knee extension    Ankle dorsiflexion    Ankle plantarflexion    Ankle inversion    Ankle eversion     PALPATION:   General  adductors tight                External Perineal Exam tight adductors attachment                              Internal Pelvic Floor levators stronger on the Rt  Patient confirms identification and approves PT to assess internal pelvic floor and treatment Yes  PELVIC MMT:   MMT eval  Vaginal 3/5 x 3 reps; hold 5 then tone is increased very slow to relax after contracting  Internal Anal Sphincter   External Anal Sphincter   Puborectalis   Diastasis Recti   (Blank rows = not tested)        TONE: High Rt>Lt   PROLAPSE: no  TODAY'S TREATMENT:                                                                                                                               DATE: 01/28/2023 (treatment limited secondary to increased pain) Nustep level 2 x6 min with PT present to discuss status Sit to/from stand x5 Timed Up and Go Manual therapy in prone:  Addaday to bilateral thoracic and lumbar paraspinals, glutes/piriformis, IT band, hamstrings   DATE: 01/22/23 Prone STM and myofascial release bil lumbar and Rt piriformis, gentle sacral distraction from L5  Trigger Point Dry-Needling  Treatment instructions: Expect mild to moderate muscle soreness. S/S of pneumothorax if dry needled over a lung field,  and to seek immediate medical attention should they occur. Patient verbalized understanding of these instructions and education.  Patient Consent Given: Yes Education handout provided: Previously provided Muscles treated: lumbar multifidi Electrical stimulation performed: No Parameters: N/A Treatment response/outcome: increased soft tissue length and twitch respons Nustep L1 x 6 min with status update Supine with knee bolster press up 4lb in each hand Supine march - small lift LE from bolster Supine TA indraw with green band ext 15 bil Supine band creating isometric horizontal abduction and perform rainbow with UE - 15x Supine green band isometric abduction making and arc slowly up and down - longer transversus abdominus holds Supine ball presses to opposite LE - 10 bil  DATE:  01/21/2023 Nustep level 2 x6 min with PT present to discuss status Supine knees on bolster bil UE 3lb chest press 2x10 with TA indraw Supine shoulder flexion with 3# 2x10 Supine shoulder horizontal abduction with green tband with TA contraction 2x10 Supine shoulder ER with green tband 2x10 Supine green band isometric abduction making and arc slowly up and down - longer transversus abdominus holds 2x10 Seated shoulder rows and shoulder extension with green tband 2x10 each bilat Seated green pball rollout 2x5 with 10 sec hold 3 minute walk test:   463 ft with RPE of 6/10 Prone:  Addaday to bilateral thoracic and lumbar paraspinals, glutes/piriformis, IT band, hamstrings    PATIENT EDUCATION:  Education details: urge techniques, Access Code: [email or web address] Person educated: Patient Education method: Explanation, Demonstration, Verbal cues, and Handouts Education comprehension: verbalized understanding  HOME EXERCISE PROGRAM: Access Code: [email or web address]    ASSESSMENT:  CLINICAL IMPRESSION: [name] presents to skilled PT reporting increased pain secondary to falling on Saturday.  Limited treatment session secondary to increased pain and had more manual therapy today secondary to pain.  Patient with improved time with 5 times sit to stand than initial assessment.  Following manual therapy, patient did report decreased pain stating that it was 8/10.  Patient will have a follow up session tomorrow for PT and hopes that she will be feeling better at that time.   OBJECTIVE IMPAIRMENTS: decreased coordination, decreased endurance, difficulty walking, decreased ROM, decreased strength, increased muscle spasms, impaired flexibility, impaired tone, postural dysfunction, obesity, and pain.   ACTIVITY LIMITATIONS: standing and continence  PARTICIPATION LIMITATIONS: community activity  PERSONAL FACTORS: 3+ comorbidities: C-section, Lt knee surgery x2; Rt knee x4, appendectomy, GERD, PMH kidney stone, back pain   are also affecting patient's functional outcome.   REHAB POTENTIAL: Good  CLINICAL DECISION MAKING: Evolving/moderate complexity  EVALUATION COMPLEXITY: Moderate   GOALS: Goals reviewed with patient? Yes  SHORT TERM GOALS: Target date: 12/20/22  Ind with urge technique Baseline: Goal status: MET  2.  Ind with initial HEP Baseline:  Goal status: MET on 12/24/2022  3.  Report 20% less leakage in the morning Baseline:  Goal status: MET  4. Patient to report pain in low back no greater than 4/10 NOT MET  5. Patient to be able to stand for at least 5 min  NOT MET   LONG TERM GOALS: Target date: 02/14/23  Pt will report 75% less urgency after standing up Baseline: none Goal status: MET  2.  Pt will report no leaking when walking to the bathroom Baseline: no leakage Goal status: MET  3.  Pt will be independent with advanced HEP to maintain improvements made throughout therapy  Baseline:  Goal status: IN PROGRESS  4.  Pt will report 25% reduced low back pain  due to improved pelvic floor function and coordination. Baseline: 30% improved low back pain Goal status: MET  5.  Pt will demonstrate improved standing posture due to strength and hip ROM for ability to stand at least 30 minutes at a time Baseline: few minutes at a time Goal status: IN PROGRESS  6. Patient to report low back and leg pain at no greater than 2/10 Baseline: 8-9/10 still happens when doing dishes and things like that  Goal status: INITIAL  7. Patient to improve on FOTO to predicted outcome   Goal status: INITIAL  8. Patient to improve TUG and 5 times sit to stand by 2-3 seconds   Goal status: MET for 5x sit to stand, partially met for TUG (1.5 sec drop)  9. Patient to report 85% improvement in overall symptoms    Goal status: ONGOING, 30-35% improved to date   PLAN:  PT FREQUENCY: 1-2x/week   PT DURATION: 6 weeks  PLANNED INTERVENTIONS: Therapeutic exercises, Therapeutic activity,  Neuromuscular re-education, Balance training, Gait training, Patient/Family education, Self Care, Joint mobilization, Dry Needling, Electrical stimulation, Cryotherapy, Moist heat, Taping, Biofeedback, Manual therapy, and Re-evaluation  PLAN FOR NEXT SESSION:  dry needling lumbar #5, pogress core in supine and sitting with exhale on exertion with UE overlay    Juel Burrow, PT, DPT 01/28/23 8:45 AM    [facility] [address], [place], [place] [address] Phone # [phone] Fax [phone]

## 2023-01-29 ENCOUNTER — Ambulatory Visit: Payer: 59 | Admitting: Physical Therapy

## 2023-01-29 DIAGNOSIS — M6281 Muscle weakness (generalized): Secondary | ICD-10-CM | POA: Diagnosis not present

## 2023-01-29 DIAGNOSIS — M5459 Other low back pain: Secondary | ICD-10-CM

## 2023-01-29 DIAGNOSIS — R252 Cramp and spasm: Secondary | ICD-10-CM

## 2023-01-29 DIAGNOSIS — M62838 Other muscle spasm: Secondary | ICD-10-CM

## 2023-01-29 DIAGNOSIS — R279 Unspecified lack of coordination: Secondary | ICD-10-CM

## 2023-01-29 DIAGNOSIS — R293 Abnormal posture: Secondary | ICD-10-CM

## 2023-01-29 NOTE — Therapy (Signed)
OUTPATIENT PHYSICAL THERAPY TREATMENT NOTE   Patient Name: [name] MRN: [id number] DOB:1961-04-14, 62 y.o., female Today's Date: 01/29/2023  END OF SESSION:  PT End of Session - 01/29/23 0753     Visit Number 14    Date for PT Re-Evaluation 02/14/23    Authorization Type [employer]    PT Start Time [id number]    PT Stop Time 0837    PT Time Calculation (min) 40 min    Activity Tolerance Patient tolerated treatment well    Behavior During Therapy [facility] for tasks assessed/performed                     Past Medical History:  Diagnosis Date   ALLERGIC RHINITIS 10/14/2008   ANXIETY 10/14/2008   ASTHMA 10/14/2008   DEGENERATIVE JOINT DISEASE 10/14/2008   GERD 10/14/2008   HYPERLIPIDEMIA 10/14/2008   INTERMITTENT VERTIGO 11/03/2008   NEPHROLITHIASIS, HX OF 10/14/2008   Past Surgical History:  Procedure Laterality Date   APPENDECTOMY     CESAREAN SECTION     s/p left knee surgery     s/p left ulnar nerve surgery     x's 2   s/p nasal surgery     x's 2   s/p right knee surgery     x's 4- Cartilage torn   TONSILLECTOMY     Patient Active Problem List   Diagnosis Date Noted   Vitamin D deficiency 06/20/2022   Infected tick bite of abdominal wall 03/22/2021   Acute sinusitis 03/10/2020   Kidney stones 09/02/2019   Trapezoid ligament sprain 08/30/2019   Cellulitis of right breast 07/03/2018   Bilateral leg pain 05/01/2018   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   Primary osteoarthritis of both knees 04/23/2017   Chest pain 10/17/2016   Essential hypertension 10/17/2016   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   Macular degeneration 06/29/2016   Recurrent headache 06/29/2016   Hyperglycemia 06/15/2016   MRSA exposure 06/10/2016   Cervical disc disorder 05/11/2016   Acute bronchitis 11/03/2014   Wheezing 11/03/2014   Myalgia 10/06/2014   Fever in adult 10/06/2014   Muscle strain of right gluteal region 06/22/2014   Greater trochanteric bursitis of right hip 05/17/2014    Lateral epicondylitis of right elbow 06/24/2013   Chronic pain 10/13/2012   Vertigo 05/09/2012   Encounter for well adult exam with abnormal findings 11/11/2011   INTERMITTENT VERTIGO 11/03/2008   HLD (hyperlipidemia) 10/14/2008   Anxiety state 10/14/2008   Allergic rhinitis 10/14/2008   Asthma 10/14/2008   GERD 10/14/2008   NEPHROLITHIASIS, HX OF 10/14/2008    PCP: Biagio Borg, MD  REFERRING PROVIDER:   Tyson Dense, MD   REFERRING DIAG: N39.3 (ICD-10-CM) - Stress incontinence (female) (female)   THERAPY DIAG:  Muscle weakness (generalized)  Cramp and spasm  Abnormal posture  Other low back pain  Other muscle spasm  Unspecified lack of coordination  Rationale for Evaluation and Treatment: Rehabilitation  ONSET DATE: a couple of years  SUBJECTIVE:  SUBJECTIVE STATEMENT: Pt is still sore from the fall and is better today from massage yesterday but still very sore  Fluid intake: Yes: 2 bottles of water and pepsi and/or tea with meals    PAIN:  PAIN:  Are you having pain? Yes NPRS scale: 8/10 Pain location: low back more on the right side Pain description: sharp and aching  Aggravating factors: whatever we did last time Relieving factors: medication, rest, massage    PRECAUTIONS: None  WEIGHT BEARING RESTRICTIONS: No  FALLS:  Has patient fallen in last 6 months? No  LIVING ENVIRONMENT: Lives with: lives with their family and daughter and her husband Lives in: House/apartment   OCCUPATION: retired  PLOF: Independent  PATIENT GOALS: not have extreme urgency and leakage managed           12/18/22: to be able to control back pain and be able to exercise so that she can lose weight in preparation for her knee surgery  PERTINENT HISTORY:  C-section, Lt knee  surgery x2; Rt knee x4, appendectomy, GERD, PMH kidney stone, back pain Sexual abuse: No  BOWEL MOVEMENT: Pain with bowel movement: No Type of bowel movement:Type (Bristol Stool Scale) loose recently, Frequency every other day, and Strain Yes Fully empty rectum: Yes:   Leakage: No Pads: No Fiber supplement: No  URINATION: Pain with urination: No Fully empty bladder: Yes:   Stream: Strong Urgency: Yes:   Frequency: 3 hours Leakage: Urge to void and Coughing Pads: No   INTERCOURSE: Pain with intercourse: Not currently active  PREGNANCY: C-section deliveries 1   PROLAPSE: None   OBJECTIVE:   DIAGNOSTIC FINDINGS:  MRI 12/05/22 IMPRESSION: 1. Large partially calcified inferiorly migrated disc extrusion at L1-L2 resulting in moderate to severe spinal canal stenosis with right subarticular zone effacement and likely impingement of the traversing right L2 nerve root, also present on the CT abdomen/pelvis from 2019. 2. Advanced facet arthropathy at L3-L4 and L4-L5 with associated effusions which may reflect a source of pain. 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 3. Disc protrusions and facet arthropathy at T11-T12 and T12-L1 resulting in mild-to-moderate spinal canal stenosis at T11-T12 and left subarticular zone narrowing without evidence of frank nerve root impingement at T12-L1.   PATIENT SURVEYS:  12/24/2022:  FOTO 53% (projected 62% by visit 11)  COGNITION: Overall cognitive status: Within functional limits for tasks assessed     SENSATION: MUSCLE LENGTH: Hamstrings: WFL but limited to approx 50-60 degrees bilaterally which would affect Lumbar spine Positive Thomas test bilaterally with significant lack of knee extension   LUMBAR SPECIAL TESTS:  Straight leg raise test:  Negative  FUNCTIONAL TESTS:  Eval: Single leg - has lateral lean and needs UE assist due to knee pain and decreased mobility  12/24/2022: 5 times sit to stand:  28.55 sec with UE use required TUG: 14.6 sec  01/15/22:  5x sit to stand: 23 sec with UE support TUG: 13 sec  01/21/2023: 3 minute walk test:  463 ft with RPE of 6/10  01/28/2023 Timed up and Go (TUG):  13.99 sec Five times sit to/from stand:  17.51 sec with hands pushing up from thighs  GAIT:  Comments: lateral weight shift, flexed trunk, decreased knee ext and no heel strike 12/18/22: antalgic, waddle gait and same as above  POSTURE: increased lumbar lordosis, anterior pelvic tilt, flexed trunk , weight  shift right, weight shift left, and no heel strike knee flexed to about 10 deg  PELVIC ALIGNMENT:  LUMBARAROM/PROM:   All WNL with pain in extension  LOWER EXTREMITY ROM:  passive ROM Right eval Left eval  Hip flexion 75% 50%  Hip extension 50% 50%  Hip abduction    Hip adduction    Hip internal rotation 50% 50%  Hip external rotation 50% 50%  Knee flexion 60 pain 60 pain  Knee extension -10 pain -10 pain  Ankle dorsiflexion    Ankle plantarflexion    Ankle inversion    Ankle eversion     (Blank rows = not tested)  LOWER EXTREMITY MMT:  MMT Right eval Left eval  Hip flexion 4/5   Hip extension    Hip abduction 5/5 5/5  Hip adduction 5/5 5/5  Hip internal rotation    Hip external rotation    Knee flexion    Knee extension    Ankle dorsiflexion    Ankle plantarflexion    Ankle inversion    Ankle eversion     PALPATION:   General  adductors tight                External Perineal Exam tight adductors attachment                             Internal Pelvic Floor levators stronger on the Rt  Patient confirms identification and approves PT to assess internal pelvic floor and treatment Yes  PELVIC MMT:   MMT eval  Vaginal 3/5 x 3 reps; hold 5 then tone is increased very slow to relax after  contracting  Internal Anal Sphincter   External Anal Sphincter   Puborectalis   Diastasis Recti   (Blank rows = not tested)        TONE: High Rt>Lt   PROLAPSE: no  TODAY'S TREATMENT:                                                                                                                              DATE: 01/29/23 Prone STM and myofascial release bil lumbar and Rt piriformis, gentle sacral distraction from L5  Trigger Point Dry-Needling  Treatment instructions: Expect mild to moderate muscle soreness. S/S of pneumothorax if dry needled over a lung field, and to seek immediate medical attention should they occur. Patient verbalized understanding of these instructions and education.  Patient Consent Given: Yes Education handout provided: Previously provided Muscles treated: lumbar multifidi Electrical stimulation performed: No Parameters: N/A Treatment response/outcome: increased soft tissue length and twitch respons Nustep L1 x 6 min with status update Supine with knee bolster press up 5lb in each hand 15x Supine march - small lift LE from bolster Supine band creating isometric horizontal abduction and perform rainbow with UE - 15x Supine blue band isometric abduction making and arc slowly up and down - longer transversus abdominus holds Supine  ball presses to opposite LE - 10 bil Supine LE knee ext from roll with core engaged and cues to keep pelvis stable  DATE: 01/28/2023 (treatment limited secondary to increased pain) Nustep level 2 x6 min with PT present to discuss status Sit to/from stand x5 Timed Up and Go Manual therapy in prone:  Addaday to bilateral thoracic and lumbar paraspinals, glutes/piriformis, IT band, hamstrings   DATE: 01/22/23 Prone STM and myofascial release bil lumbar and Rt piriformis, gentle sacral distraction from L5  Trigger Point Dry-Needling  Treatment instructions: Expect mild to moderate muscle soreness. S/S of pneumothorax if dry needled  over a lung field, and to seek immediate medical attention should they occur. Patient verbalized understanding of these instructions and education.  Patient Consent Given: Yes Education handout provided: Previously provided Muscles treated: lumbar multifidi Electrical stimulation performed: No Parameters: N/A Treatment response/outcome: increased soft tissue length and twitch respons Nustep L1 x 6 min with status update Supine with knee bolster press up 4lb in each hand Supine march - small lift LE from bolster Supine TA indraw with green band ext 15 bil Supine band creating isometric horizontal abduction and perform rainbow with UE - 15x Supine green band isometric abduction making and arc slowly up and down - longer transversus abdominus holds Supine ball presses to opposite LE - 10 bil  DATE: 01/21/2023 Nustep level 2 x6 min with PT present to discuss status Supine knees on bolster bil UE 3lb chest press 2x10 with TA indraw Supine shoulder flexion with 3# 2x10 Supine shoulder horizontal abduction with green tband with TA contraction 2x10 Supine shoulder ER with green tband 2x10 Supine green band isometric abduction making and arc slowly up and down - longer transversus abdominus holds 2x10 Seated shoulder rows and shoulder extension with green tband 2x10 each bilat Seated green pball rollout 2x5 with 10 sec hold 3 minute walk test:   463 ft with RPE of 6/10 Prone:  Addaday to bilateral thoracic and lumbar paraspinals, glutes/piriformis, IT band, hamstrings    PATIENT EDUCATION:  Education details: urge techniques, Access Code: [email or web address] Person educated: Patient Education method: Explanation, Demonstration, Verbal cues, and Handouts Education comprehension: verbalized understanding  HOME EXERCISE PROGRAM: Access Code: [id number]    ASSESSMENT:  CLINICAL IMPRESSION: Pt was still sore from fall on Sunday and was having slightly elevated pain.  States it was 8/10.  Focused on  release of soft tissue and activating core in supine due to back still being more flared up.  Pt still needing some cues to exhale when engaging the core.  Pt was able to add increased resistance with exercises today.  Patient will have a follow up session tomorrow for PT and hopes that she will be feeling better at that time.   OBJECTIVE IMPAIRMENTS: decreased coordination, decreased endurance, difficulty walking, decreased ROM, decreased strength, increased muscle spasms, impaired flexibility, impaired tone, postural dysfunction, obesity, and pain.   ACTIVITY LIMITATIONS: standing and continence  PARTICIPATION LIMITATIONS: community activity  PERSONAL FACTORS: 3+ comorbidities: C-section, Lt knee surgery x2; Rt knee x4, appendectomy, GERD, PMH kidney stone, back pain  are also affecting patient's functional outcome.   REHAB POTENTIAL: Good  CLINICAL DECISION MAKING: Evolving/moderate complexity  EVALUATION COMPLEXITY: Moderate   GOALS: Goals reviewed with patient? Yes  SHORT TERM GOALS: Target date: 12/20/22  Ind with urge technique Baseline: Goal status: MET  2.  Ind with initial HEP Baseline:  Goal status: MET on 12/24/2022  3.  Report 20% less leakage in the morning  Baseline:  Goal status: MET  4. Patient to report pain in low back no greater than 4/10 NOT MET  5. Patient to be able to stand for at least 5 min  NOT MET   LONG TERM GOALS: Target date: 02/14/23  Pt will report 75% less urgency after standing up Baseline: none Goal status: MET  2.  Pt will report no leaking when walking to the bathroom Baseline: no leakage Goal status: MET  3.  Pt will be independent with advanced HEP to maintain improvements made throughout therapy  Baseline:  Goal status: IN PROGRESS  4.  Pt will report 25% reduced low back pain due to improved pelvic floor function and coordination. Baseline: 30% improved low back pain Goal status: MET  5.  Pt will demonstrate improved  standing posture due to strength and hip ROM for ability to stand at least 30 minutes at a time Baseline: few minutes at a time Goal status: IN PROGRESS  6. Patient to report low back and leg pain at no greater than 2/10 Baseline: 8-9/10 still happens when doing dishes and things like that  Goal status: INITIAL  7. Patient to improve on FOTO to predicted outcome   Goal status: INITIAL  8. Patient to improve TUG and 5 times sit to stand by 2-3 seconds   Goal status: MET for 5x sit to stand, partially met for TUG (1.5 sec drop)  9. Patient to report 85% improvement in overall symptoms    Goal status: ONGOING, 30-35% improved to date   PLAN:  PT FREQUENCY: 1-2x/week   PT DURATION: 6 weeks  PLANNED INTERVENTIONS: Therapeutic exercises, Therapeutic activity, Neuromuscular re-education, Balance training, Gait training, Patient/Family education, Self Care, Joint mobilization, Dry Needling, Electrical stimulation, Cryotherapy, Moist heat, Taping, Biofeedback, Manual therapy, and Re-evaluation  PLAN FOR NEXT SESSION:  dry needling lumbar #6, pogress core in supine and sitting with exhale on exertion with UE overlay    Gustavus Bryant, PT, DPT 01/29/23 8:36 AM     [facility] [address], [place], [place] [address] Phone # [phone] Fax [phone]

## 2023-02-04 ENCOUNTER — Ambulatory Visit: Payer: 59

## 2023-02-04 DIAGNOSIS — M6281 Muscle weakness (generalized): Secondary | ICD-10-CM

## 2023-02-04 DIAGNOSIS — M5459 Other low back pain: Secondary | ICD-10-CM

## 2023-02-04 DIAGNOSIS — R252 Cramp and spasm: Secondary | ICD-10-CM

## 2023-02-04 DIAGNOSIS — R293 Abnormal posture: Secondary | ICD-10-CM

## 2023-02-04 NOTE — Therapy (Signed)
OUTPATIENT PHYSICAL THERAPY TREATMENT NOTE   Patient Name: [name] MRN: [id number] DOB:Nov 29, 1960, 62 y.o., female Today's Date: 02/04/2023  END OF SESSION:  PT End of Session - 02/04/23 0839     Visit Number 15    Date for PT Re-Evaluation 02/14/23    Authorization Type [employer]    Progress Note Due on Visit 62    PT Start Time 0759    PT Stop Time 0840    PT Time Calculation (min) 41 min    Activity Tolerance Patient tolerated treatment well    Behavior During Therapy [facility] for tasks assessed/performed                      Past Medical History:  Diagnosis Date   ALLERGIC RHINITIS 10/14/2008   ANXIETY 10/14/2008   ASTHMA 10/14/2008   DEGENERATIVE JOINT DISEASE 10/14/2008   GERD 10/14/2008   HYPERLIPIDEMIA 10/14/2008   INTERMITTENT VERTIGO 11/03/2008   NEPHROLITHIASIS, HX OF 10/14/2008   Past Surgical History:  Procedure Laterality Date   APPENDECTOMY     CESAREAN SECTION     s/p left knee surgery     s/p left ulnar nerve surgery     x's 2   s/p nasal surgery     x's 2   s/p right knee surgery     x's 4- Cartilage torn   TONSILLECTOMY     Patient Active Problem List   Diagnosis Date Noted   Vitamin D deficiency 06/20/2022   Infected tick bite of abdominal wall 03/22/2021   Acute sinusitis 03/10/2020   Kidney stones 09/02/2019   Trapezoid ligament sprain 08/30/2019   Cellulitis of right breast 07/03/2018   Bilateral leg pain 05/01/2018   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   Primary osteoarthritis of both knees 04/23/2017   Chest pain 10/17/2016   Essential hypertension 10/17/2016   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   Macular degeneration 06/29/2016   Recurrent headache 06/29/2016   Hyperglycemia 06/15/2016   MRSA exposure 06/10/2016   Cervical disc disorder 05/11/2016   Acute bronchitis 11/03/2014   Wheezing 11/03/2014   Myalgia 10/06/2014   Fever in adult 10/06/2014   Muscle strain of right gluteal region 06/22/2014   Greater  trochanteric bursitis of right hip 05/17/2014   Lateral epicondylitis of right elbow 06/24/2013   Chronic pain 10/13/2012   Vertigo 05/09/2012   Encounter for well adult exam with abnormal findings 11/11/2011   INTERMITTENT VERTIGO 11/03/2008   HLD (hyperlipidemia) 10/14/2008   Anxiety state 10/14/2008   Allergic rhinitis 10/14/2008   Asthma 10/14/2008   GERD 10/14/2008   NEPHROLITHIASIS, HX OF 10/14/2008    PCP: Biagio Borg, MD  REFERRING PROVIDER:   Tyson Dense, MD   REFERRING DIAG: N39.3 (ICD-10-CM) - Stress incontinence (female) (female)   THERAPY DIAG:  Muscle weakness (generalized)  Cramp and spasm  Abnormal posture  Other low back pain  Rationale for Evaluation and Treatment: Rehabilitation  ONSET DATE: a couple of years  SUBJECTIVE:  SUBJECTIVE STATEMENT: I was up on my feet a lot over the weekend so I am hurting today.    Fluid intake: Yes: 2 bottles of water and pepsi and/or tea with meals    PAIN:  PAIN:  Are you having pain? Yes NPRS scale: 7/10 Pain location: low back more on the right side Pain description: sharp and aching  Aggravating factors: whatever we did last time Relieving factors: medication, rest, massage    PRECAUTIONS: None  WEIGHT BEARING RESTRICTIONS: No  FALLS:  Has patient fallen in last 6 months? No  LIVING ENVIRONMENT: Lives with: lives with their family and daughter and her husband Lives in: House/apartment   OCCUPATION: retired  PLOF: Independent  PATIENT GOALS: not have extreme urgency and leakage managed           12/18/22: to be able to control back pain and be able to exercise so that she can lose weight in preparation for her knee surgery  PERTINENT HISTORY:  C-section, Lt knee surgery x2; Rt knee x4, appendectomy,  GERD, PMH kidney stone, back pain Sexual abuse: No  BOWEL MOVEMENT: Pain with bowel movement: No Type of bowel movement:Type (Bristol Stool Scale) loose recently, Frequency every other day, and Strain Yes Fully empty rectum: Yes:   Leakage: No Pads: No Fiber supplement: No  URINATION: Pain with urination: No Fully empty bladder: Yes:   Stream: Strong Urgency: Yes:   Frequency: 3 hours Leakage: Urge to void and Coughing Pads: No   INTERCOURSE: Pain with intercourse: Not currently active  PREGNANCY: C-section deliveries 1  PROLAPSE: None   OBJECTIVE:   DIAGNOSTIC FINDINGS:  MRI 12/05/22 IMPRESSION: 1. Large partially calcified inferiorly migrated disc extrusion at L1-L2 resulting in moderate to severe spinal canal stenosis with right subarticular zone effacement and likely impingement of the traversing right L2 nerve root, also present on the CT abdomen/pelvis from 2019. 2. Advanced facet arthropathy at L3-L4 and L4-L5 with associated effusions which may reflect a source of pain. 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 3. Disc protrusions and facet arthropathy at T11-T12 and T12-L1 resulting in mild-to-moderate spinal canal stenosis at T11-T12 and left subarticular zone narrowing without evidence of frank nerve root impingement at T12-L1.   PATIENT SURVEYS:  12/24/2022:  FOTO 53% (projected 62% by visit 11)  COGNITION: Overall cognitive status: Within functional limits for tasks assessed     SENSATION: MUSCLE LENGTH: Hamstrings: WFL but limited to approx 50-60 degrees bilaterally which would affect Lumbar spine Positive Thomas test bilaterally with significant lack of knee extension   LUMBAR SPECIAL TESTS:  Straight leg raise test: Negative  FUNCTIONAL TESTS:  Eval: Single leg  - has lateral lean and needs UE assist due to knee pain and decreased mobility  12/24/2022: 5 times sit to stand:  28.55 sec with UE use required TUG: 14.6 sec  01/15/22:  5x sit to stand: 23 sec with UE support TUG: 13 sec  01/21/2023: 3 minute walk test:  463 ft with RPE of 6/10  01/28/2023 Timed up and Go (TUG):  13.99 sec Five times sit to/from stand:  17.51 sec with hands pushing up from thighs  GAIT:  Comments: lateral weight shift, flexed trunk, decreased knee ext and no heel strike 12/18/22: antalgic, waddle gait and same as above  POSTURE: increased lumbar lordosis, anterior pelvic tilt, flexed trunk , weight shift  right, weight shift left, and no heel strike knee flexed to about 10 deg  PELVIC ALIGNMENT:  LUMBARAROM/PROM: All WNL with pain in extension  LOWER EXTREMITY ROM:  passive ROM Right eval Left eval  Hip flexion 75% 50%  Hip extension 50% 50%  Hip abduction    Hip adduction    Hip internal rotation 50% 50%  Hip external rotation 50% 50%  Knee flexion 60 pain 60 pain  Knee extension -10 pain -10 pain  Ankle dorsiflexion    Ankle plantarflexion    Ankle inversion    Ankle eversion     (Blank rows = not tested)  LOWER EXTREMITY MMT:  MMT Right eval Left eval  Hip flexion 4/5   Hip extension    Hip abduction 5/5 5/5  Hip adduction 5/5 5/5  Hip internal rotation    Hip external rotation    Knee flexion    Knee extension    Ankle dorsiflexion    Ankle plantarflexion    Ankle inversion    Ankle eversion     PALPATION:   General  adductors tight                External Perineal Exam tight adductors attachment                             Internal Pelvic Floor levators stronger on the Rt  Patient confirms identification and approves PT to assess internal pelvic floor and treatment Yes  PELVIC MMT:   MMT eval  Vaginal 3/5 x 3 reps; hold 5 then tone is increased very slow to relax after contracting  Internal Anal Sphincter   External Anal  Sphincter   Puborectalis   Diastasis Recti   (Blank rows = not tested)        TONE: High Rt>Lt   PROLAPSE: no  TODAY'S TREATMENT:             DATE: 02/04/23 Nustep L2 x 6 min with status update Seated hamstring stretch 3x20 seconds  Seated forward stretch 20 seconds x5  Supine ball squeeze with TA activation x20 Trunk rotation: x10 bil Supine hip abduction: blue band with TA activation x20 Supine ball presses to opposite LE - 10 bil Addaday: to bil low back and gluteals                                                                                                                 DATE: 01/29/23 Prone STM and myofascial release bil lumbar and Rt piriformis, gentle sacral distraction from L5  Trigger Point Dry-Needling  Treatment instructions: Expect mild to moderate muscle soreness. S/S of pneumothorax if dry needled over a lung field, and to seek immediate medical attention should they occur. Patient verbalized understanding of these instructions and education.  Patient Consent Given: Yes Education handout provided: Previously provided Muscles treated: lumbar multifidi Electrical stimulation performed: No Parameters: N/A Treatment response/outcome: increased soft tissue length and twitch respons Nustep L1 x  6 min with status update Supine with knee bolster press up 5lb in each hand 15x Supine march - small lift LE from bolster Supine band creating isometric horizontal abduction and perform rainbow with UE - 15x Supine blue band isometric abduction making and arc slowly up and down - longer transversus abdominus holds Supine ball presses to opposite LE - 10 bil Supine LE knee ext from roll with core engaged and cues to keep pelvis stable  DATE: 01/28/2023 (treatment limited secondary to increased pain) Nustep level 2 x6 min with PT present to discuss status Sit to/from stand x5 Timed Up and Go Manual therapy in prone:  Addaday to bilateral thoracic and lumbar paraspinals,  glutes/piriformis, IT band, hamstrings   PATIENT EDUCATION:  Education details: urge techniques, Access Code: [id number] Person educated: Patient Education method: Explanation, Demonstration, Verbal cues, and Handouts Education comprehension: verbalized understanding  HOME EXERCISE PROGRAM: Access Code: [id number]    ASSESSMENT:  CLINICAL IMPRESSION: Pt arrived with increased bil lumbar pain due to being on her feet a lot over the weekend.  PT advised her to stretch throughout the day today.  Pt did well with current level of exercise and reports compliance with HEP.  Next session will focus on finalizing HEP, goal assessment and DN to lumbar spine.     OBJECTIVE IMPAIRMENTS: decreased coordination, decreased endurance, difficulty walking, decreased ROM, decreased strength, increased muscle spasms, impaired flexibility, impaired tone, postural dysfunction, obesity, and pain.   ACTIVITY LIMITATIONS: standing and continence  PARTICIPATION LIMITATIONS: community activity  PERSONAL FACTORS: 3+ comorbidities: C-section, Lt knee surgery x2; Rt knee x4, appendectomy, GERD, PMH kidney stone, back pain  are also affecting patient's functional outcome.   REHAB POTENTIAL: Good  CLINICAL DECISION MAKING: Evolving/moderate complexity  EVALUATION COMPLEXITY: Moderate   GOALS: Goals reviewed with patient? Yes  SHORT TERM GOALS: Target date: 12/20/22  Ind with urge technique Baseline: Goal status: MET  2.  Ind with initial HEP Baseline:  Goal status: MET on 12/24/2022  3.  Report 20% less leakage in the morning Baseline:  Goal status: MET  4. Patient to report pain in low back no greater than 4/10 NOT MET  5. Patient to be able to stand for at least 5 min  NOT MET   LONG TERM GOALS: Target date: 02/14/23  Pt will report 75% less urgency after standing up Baseline: none Goal status: MET  2.  Pt will report no leaking when walking to the bathroom Baseline: no leakage Goal  status: MET  3.  Pt will be independent with advanced HEP to maintain improvements made throughout therapy  Baseline:  Goal status: IN PROGRESS  4.  Pt will report 25% reduced low back pain due to improved pelvic floor function and coordination. Baseline: 30% improved low back pain Goal status: MET  5.  Pt will demonstrate improved standing posture due to strength and hip ROM for ability to stand at least 30 minutes at a time Baseline: few minutes at a time Goal status: IN PROGRESS  6. Patient to report low back and leg pain at no greater than 2/10 Baseline: 8-9/10 still happens when doing dishes and things like that  Goal status: INITIAL  7. Patient to improve on FOTO to predicted outcome   Goal status: INITIAL  8. Patient to improve TUG and 5 times sit to stand by 2-3 seconds   Goal status: MET for 5x sit to stand, partially met for TUG (1.5 sec drop)  9. Patient to report 85%  improvement in overall symptoms    Goal status: ONGOING, 30-35% improved to date   PLAN:  PT FREQUENCY: 1-2x/week   PT DURATION: 6 weeks  PLANNED INTERVENTIONS: Therapeutic exercises, Therapeutic activity, Neuromuscular re-education, Balance training, Gait training, Patient/Family education, Self Care, Joint mobilization, Dry Needling, Electrical stimulation, Cryotherapy, Moist heat, Taping, Biofeedback, Manual therapy, and Re-evaluation  PLAN FOR NEXT SESSION:  1 more session probable.  DN again, finalize HEP   Sigurd Sos, PT 02/04/23 8:40 AM      [facility] [address] [place], [place] [address] Phone # [phone] Fax [phone]

## 2023-02-05 ENCOUNTER — Ambulatory Visit: Payer: 59 | Admitting: Physical Therapy

## 2023-02-05 ENCOUNTER — Encounter: Payer: Self-pay | Admitting: Physical Therapy

## 2023-02-05 DIAGNOSIS — M6281 Muscle weakness (generalized): Secondary | ICD-10-CM | POA: Diagnosis not present

## 2023-02-05 DIAGNOSIS — R293 Abnormal posture: Secondary | ICD-10-CM

## 2023-02-05 DIAGNOSIS — M5459 Other low back pain: Secondary | ICD-10-CM

## 2023-02-05 DIAGNOSIS — M62838 Other muscle spasm: Secondary | ICD-10-CM

## 2023-02-05 DIAGNOSIS — R279 Unspecified lack of coordination: Secondary | ICD-10-CM

## 2023-02-05 NOTE — Therapy (Addendum)
OUTPATIENT PHYSICAL THERAPY TREATMENT NOTE   Patient Name: [name] MRN: [id number] DOB:[birth year], 62 y.o., female Today's Date: 02/05/2023  END OF SESSION:  PT End of Session - 02/05/23 0759     Visit Number 16    Date for PT Re-Evaluation 02/14/23    Authorization Type [employer]    Progress Note Due on Visit 49    PT Start Time 0759    PT Stop Time 0839    PT Time Calculation (min) 40 min    Activity Tolerance Patient tolerated treatment well    Behavior During Therapy [facility] for tasks assessed/performed                       Past Medical History:  Diagnosis Date   ALLERGIC RHINITIS 10/14/2008   ANXIETY 10/14/2008   ASTHMA 10/14/2008   DEGENERATIVE JOINT DISEASE 10/14/2008   GERD 10/14/2008   HYPERLIPIDEMIA 10/14/2008   INTERMITTENT VERTIGO 11/03/2008   NEPHROLITHIASIS, HX OF 10/14/2008   Past Surgical History:  Procedure Laterality Date   APPENDECTOMY     CESAREAN SECTION     s/p left knee surgery     s/p left ulnar nerve surgery     x's 2   s/p nasal surgery     x's 2   s/p right knee surgery     x's 4- Cartilage torn   TONSILLECTOMY     Patient Active Problem List   Diagnosis Date Noted   Vitamin D deficiency 06/20/2022   Infected tick bite of abdominal wall 03/22/2021   Acute sinusitis 03/10/2020   Kidney stones 09/02/2019   Trapezoid ligament sprain 08/30/2019   Cellulitis of right breast 07/03/2018   Bilateral leg pain 05/01/2018   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   Primary osteoarthritis of both knees 04/23/2017   Chest pain 10/17/2016   Essential hypertension 10/17/2016   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   Macular degeneration 06/29/2016   Recurrent headache 06/29/2016   Hyperglycemia 06/15/2016   MRSA exposure 06/10/2016   Cervical disc disorder 05/11/2016   Acute bronchitis 11/03/2014   Wheezing 11/03/2014   Myalgia 10/06/2014   Fever in adult 10/06/2014   Muscle strain of right gluteal region 06/22/2014   Greater  trochanteric bursitis of right hip 05/17/2014   Lateral epicondylitis of right elbow 06/24/2013   Chronic pain 10/13/2012   Vertigo 05/09/2012   Encounter for well adult exam with abnormal findings 11/11/2011   INTERMITTENT VERTIGO 11/03/2008   HLD (hyperlipidemia) 10/14/2008   Anxiety state 10/14/2008   Allergic rhinitis 10/14/2008   Asthma 10/14/2008   GERD 10/14/2008   NEPHROLITHIASIS, HX OF 10/14/2008    PCP: Biagio Borg, MD  REFERRING PROVIDER:   Tyson Dense, MD   REFERRING DIAG: N39.3 (ICD-10-CM) - Stress incontinence (female) (female)   THERAPY DIAG:  Muscle weakness (generalized)  Abnormal posture  Other low back pain  Other muscle spasm  Unspecified lack of coordination  Rationale for Evaluation and Treatment: Rehabilitation  ONSET DATE: a couple of years  SUBJECTIVE:  SUBJECTIVE STATEMENT: No change since yesterday.    Fluid intake: Yes: 2 bottles of water and pepsi and/or tea with meals    PAIN:  PAIN:  Are you having pain? Yes NPRS scale: 7/10 Pain location: low back more on the right side Pain description: sharp and aching  Aggravating factors: whatever we did last time Relieving factors: medication, rest, massage    PRECAUTIONS: None  WEIGHT BEARING RESTRICTIONS: No  FALLS:  Has patient fallen in last 6 months? No  LIVING ENVIRONMENT: Lives with: lives with their family and daughter and her husband Lives in: House/apartment   OCCUPATION: retired  PLOF: Independent  PATIENT GOALS: not have extreme urgency and leakage managed           12/18/22: to be able to control back pain and be able to exercise so that she can lose weight in preparation for her knee surgery  PERTINENT HISTORY:  C-section, Lt knee surgery x2; Rt knee x4, appendectomy,  GERD, PMH kidney stone, back pain Sexual abuse: No  BOWEL MOVEMENT: Pain with bowel movement: No Type of bowel movement:Type (Bristol Stool Scale) loose recently, Frequency every other day, and Strain Yes Fully empty rectum: Yes:   Leakage: No Pads: No Fiber supplement: No  URINATION: Pain with urination: No Fully empty bladder: Yes:   Stream: Strong Urgency: Yes:   Frequency: 3 hours Leakage: Urge to void and Coughing Pads: No   INTERCOURSE: Pain with intercourse: Not currently active  PREGNANCY: C-section deliveries 1  PROLAPSE: None   OBJECTIVE:   DIAGNOSTIC FINDINGS:  MRI 12/05/22 IMPRESSION: 1. Large partially calcified inferiorly migrated disc extrusion at L1-L2 resulting in moderate to severe spinal canal stenosis with right subarticular zone effacement and likely impingement of the traversing right L2 nerve root, also present on the CT abdomen/pelvis from 2019. 2. Advanced facet arthropathy at L3-L4 and L4-L5 with associated effusions which may reflect a source of pain. 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 3. Disc protrusions and facet arthropathy at T11-T12 and T12-L1 resulting in mild-to-moderate spinal canal stenosis at T11-T12 and left subarticular zone narrowing without evidence of frank nerve root impingement at T12-L1.   PATIENT SURVEYS:  12/24/2022:  FOTO 53% (projected 62% by visit 11)  COGNITION: Overall cognitive status: Within functional limits for tasks assessed     SENSATION: MUSCLE LENGTH: Hamstrings: WFL but limited to approx 50-60 degrees bilaterally which would affect Lumbar spine Positive Thomas test bilaterally with significant lack of knee extension   LUMBAR SPECIAL TESTS:  Straight leg raise test: Negative  FUNCTIONAL TESTS:  Eval: Single leg  - has lateral lean and needs UE assist due to knee pain and decreased mobility  12/24/2022: 5 times sit to stand:  28.55 sec with UE use required TUG: 14.6 sec  01/15/22:  5x sit to stand: 23 sec with UE support TUG: 13 sec  01/21/2023: 3 minute walk test:  463 ft with RPE of 6/10  01/28/2023 Timed up and Go (TUG):  13.99 sec Five times sit to/from stand:  17.51 sec with hands pushing up from thighs  GAIT:  Comments: lateral weight shift, flexed trunk, decreased knee ext and no heel strike 12/18/22: antalgic, waddle gait and same as above  POSTURE: increased lumbar lordosis, anterior pelvic tilt, flexed trunk , weight shift right, weight shift left, and no heel strike knee flexed to about  10 deg  PELVIC ALIGNMENT:  LUMBARAROM/PROM: All WNL with pain in extension  LOWER EXTREMITY ROM:  passive ROM Right eval Left eval  Hip flexion 75% 50%  Hip extension 50% 50%  Hip abduction    Hip adduction    Hip internal rotation 50% 50%  Hip external rotation 50% 50%  Knee flexion 60 pain 60 pain  Knee extension -10 pain -10 pain  Ankle dorsiflexion    Ankle plantarflexion    Ankle inversion    Ankle eversion     (Blank rows = not tested)  LOWER EXTREMITY MMT:  MMT Right eval Left eval  Hip flexion 4/5   Hip extension    Hip abduction 5/5 5/5  Hip adduction 5/5 5/5  Hip internal rotation    Hip external rotation    Knee flexion    Knee extension    Ankle dorsiflexion    Ankle plantarflexion    Ankle inversion    Ankle eversion     PALPATION:   General  adductors tight                External Perineal Exam tight adductors attachment                             Internal Pelvic Floor levators stronger on the Rt  Patient confirms identification and approves PT to assess internal pelvic floor and treatment Yes  PELVIC MMT:   MMT eval  Vaginal 3/5 x 3 reps; hold 5 then tone is increased very slow to relax after contracting  Internal Anal Sphincter   External Anal  Sphincter   Puborectalis   Diastasis Recti   (Blank rows = not tested)        TONE: High Rt>Lt   PROLAPSE: no  TODAY'S TREATMENT:             DATE: 02/05/23 Prone STM and myofascial release bil lumbar and Rt piriformis, gentle sacral distraction from L5  Trigger Point Dry-Needling  Treatment instructions: Expect mild to moderate muscle soreness. S/S of pneumothorax if dry needled over a lung field, and to seek immediate medical attention should they occur. Patient verbalized understanding of these instructions and education.  Patient Consent Given: Yes Education handout provided: Previously provided Muscles treated: lumbar multifidi Electrical stimulation performed: No Parameters: N/A Treatment response/outcome: increased soft tissue length and twitch respons Pball roll out - 15x H/s stretch seated - 15x Pball overhead seated - 15x Seated band pull apart - 15x green band   Nustep L1 x 6 min with status update Supine with knee bolster press up 5lb in each hand 15x Supine march - small lift LE from bolster Supine band creating isometric horizontal abduction and perform rainbow with UE - 15x Supine blue band isometric abduction making and arc slowly up and down - longer transversus abdominus holds Supine ball presses to opposite LE - 10 bil Supine LE knee ext from roll with core engaged and cues to keep pelvis stable  DATE: 02/04/23 Nustep L2 x 6 min with status update Seated hamstring stretch 3x20 seconds  Seated forward stretch 20 seconds x5  Supine ball squeeze with TA activation x20 Trunk rotation: x10 bil Supine hip abduction: blue band with TA activation x20 Supine ball presses to opposite LE - 10 bil Addaday: to bil low back and gluteals  PATIENT EDUCATION:  Education details: urge techniques, Access Code: [email or web address] Person educated: Patient Education  method: Explanation, Demonstration, Verbal cues, and Handouts Education comprehension: verbalized understanding  HOME EXERCISE PROGRAM: Access Code: [email or web address]    ASSESSMENT:  CLINICAL IMPRESSION: Pt arrived to review HEP and for dry needling #6 for maximum benefit to release tension in low back. Pt continues to have some relief but has not met all goal.  There has been no sign of progression in the past several visits.  As of today, pt is ind with HEP and is expected to maintain current level of function.  Pt will be d/c today   OBJECTIVE IMPAIRMENTS: decreased coordination, decreased endurance, difficulty walking, decreased ROM, decreased strength, increased muscle spasms, impaired flexibility, impaired tone, postural dysfunction, obesity, and pain.   ACTIVITY LIMITATIONS: standing and continence  PARTICIPATION LIMITATIONS: community activity  PERSONAL FACTORS: 3+ comorbidities: C-section, Lt knee surgery x2; Rt knee x4, appendectomy, GERD, PMH kidney stone, back pain  are also affecting patient's functional outcome.   REHAB POTENTIAL: Good  CLINICAL DECISION MAKING: Evolving/moderate complexity  EVALUATION COMPLEXITY: Moderate   GOALS: Goals reviewed with patient? Yes  SHORT TERM GOALS: Target date: 12/20/22  Ind with urge technique Baseline: Goal status: MET  2.  Ind with initial HEP Baseline:  Goal status: MET on 12/24/2022  3.  Report 20% less leakage in the morning Baseline:  Goal status: MET  4. Patient to report pain in low back no greater than 4/10 NOT MET  5. Patient to be able to stand for at least 5 min  NOT MET   LONG TERM GOALS: Target date: 02/14/23  Pt will report 75% less urgency after standing up Baseline: none Goal status: MET  2.  Pt will report no leaking when walking to the bathroom Baseline: no leakage Goal status: MET  3.  Pt will be independent with advanced HEP to maintain improvements made throughout therapy  Baseline:  Goal status:  MET  4.  Pt will report 25% reduced low back pain due to improved pelvic floor function and coordination. Baseline: 30% improved low back pain Goal status: MET  5.  Pt will demonstrate improved standing posture due to strength and hip ROM for ability to stand at least 30 minutes at a time Baseline: few minutes at a time Goal status: NOT MET  6. Patient to report low back and leg pain at no greater than 2/10 Baseline: 8-9/10 still happens when doing dishes and things like that  Goal status: NOT MET  7. Patient to improve on FOTO to predicted outcome   Goal status: NOT MET  8. Patient to improve TUG and 5 times sit to stand by 2-3 seconds   Goal status: MET for 5x sit to stand, partially met for TUG (1.5 sec drop)  9. Patient to report 85% improvement in overall symptoms    Goal status: NOT MET, 30-35% improved to date   PLAN:  PT FREQUENCY: 1-2x/week   PT DURATION: 6 weeks  PLANNED INTERVENTIONS: Therapeutic exercises, Therapeutic activity, Neuromuscular re-education, Balance training, Gait training, Patient/Family education, Self Care, Joint mobilization, Dry Needling, Electrical stimulation, Cryotherapy, Moist heat, Taping, Biofeedback, Manual therapy, and Re-evaluation  PLAN FOR NEXT SESSION:  d/c today   Gustavus Bryant, PT, DPT 02/05/23 8:52 AM   PHYSICAL THERAPY DISCHARGE SUMMARY  Visits from Start of Care: 16  Current functional level related to goals / functional outcomes: See above goals   Remaining deficits: See above   Education / Equipment:  HEP   Patient agrees to discharge. Patient goals were partially met. Patient is being discharged due to lack of progress.  Gustavus Bryant, PT, DPT 02/05/23 8:53 AM     [facility] [address] [place], [place] [address] Phone # [phone] Fax [phone]

## 2023-06-16 ENCOUNTER — Other Ambulatory Visit: Payer: Self-pay | Admitting: Internal Medicine

## 2023-06-26 ENCOUNTER — Encounter: Payer: 59 | Admitting: Internal Medicine

## 2023-07-10 ENCOUNTER — Other Ambulatory Visit: Payer: Self-pay | Admitting: Internal Medicine

## 2023-08-10 ENCOUNTER — Other Ambulatory Visit: Payer: Self-pay | Admitting: Internal Medicine

## 2023-08-12 ENCOUNTER — Other Ambulatory Visit: Payer: Self-pay

## 2023-10-25 ENCOUNTER — Ambulatory Visit: Payer: 59 | Admitting: Internal Medicine

## 2023-10-29 ENCOUNTER — Ambulatory Visit: Payer: 59 | Admitting: Family Medicine

## 2023-11-07 HISTORY — PX: BACK SURGERY: SHX140

## 2023-11-26 ENCOUNTER — Encounter: Payer: Self-pay | Admitting: Family Medicine

## 2023-11-26 ENCOUNTER — Ambulatory Visit: Payer: 59 | Admitting: Family Medicine

## 2023-11-26 VITALS — BP 125/84 | HR 106 | Ht 68.5 in | Wt 296.1 lb

## 2023-11-26 DIAGNOSIS — E559 Vitamin D deficiency, unspecified: Secondary | ICD-10-CM | POA: Diagnosis not present

## 2023-11-26 DIAGNOSIS — I10 Essential (primary) hypertension: Secondary | ICD-10-CM

## 2023-11-26 DIAGNOSIS — M48062 Spinal stenosis, lumbar region with neurogenic claudication: Secondary | ICD-10-CM | POA: Diagnosis not present

## 2023-11-26 DIAGNOSIS — H04123 Dry eye syndrome of bilateral lacrimal glands: Secondary | ICD-10-CM | POA: Insufficient documentation

## 2023-11-26 DIAGNOSIS — E538 Deficiency of other specified B group vitamins: Secondary | ICD-10-CM

## 2023-11-26 DIAGNOSIS — Z23 Encounter for immunization: Secondary | ICD-10-CM | POA: Diagnosis not present

## 2023-11-26 DIAGNOSIS — H0014 Chalazion left upper eyelid: Secondary | ICD-10-CM | POA: Insufficient documentation

## 2023-11-26 DIAGNOSIS — Z7689 Persons encountering health services in other specified circumstances: Secondary | ICD-10-CM

## 2023-11-26 DIAGNOSIS — G43109 Migraine with aura, not intractable, without status migrainosus: Secondary | ICD-10-CM

## 2023-11-26 MED ORDER — LOSARTAN POTASSIUM-HCTZ 50-12.5 MG PO TABS: 1.0000 | ORAL_TABLET | Freq: Every day | ORAL | 1 refills | Status: DC

## 2023-11-26 NOTE — Progress Notes (Signed)
 New Patient Office Visit  Subjective   Patient ID: [name], female    DOB: Jun 13, 1961  Age: 63 y.o. MRN: [id number]  CC:  Chief Complaint  Patient presents with   New Patient (Initial Visit)    HPI [name] presents to establish care  Patient was previously seeing Dr. Norleen at [facility].  On December 26 she had spinal surgery for spinal stenosis and neurogenic claudication.  Currently receiving weekly home physical therapy. Her surgeon is Dr. Royden Schneider.    Patient takes losartan -HCTZ for blood pressure.  Needs refill today.  No questions or concerns regarding this medication.  Patient states that for the past few days it feels like her left eyelid is tender and swollen.  She has a history of dry eyes.  Uses an over-the-counter eyedrop called blink.  Patient takes nortriptyline  for migraines.  She takes indomethacin  for inflammation.  States that she used to take Bextra until it was taken off the market, now takes indomethacin .  States when she has tried to come off of this medication she gets severe aches and pain.  Patient's gynecologist is Dr. Marne.  She is up-to-date on Pap smears and mammograms.  She does not remember the last time she had her colonoscopy.  States she believes she is due for 1.  PMH: Seasonal allergies, asthma, arthritis, HLD, vertigo, GERD  Medications: Gabapentin 300 mg 3 times daily, hydrocodone , indomethacin , losartan -HCTZ, nortriptyline  40 mg,   PSH: Appendectomy, knee surgery, C-section, L1-2 laminectomy  FH: HTN.    Tobacco use: no Alcohol use: no Drug use: no Marital status: single.  One child - 58 yo.   Employment: retired.  [employer].    Screenings:  Colon Cancer: Believes it is time for a repeat Lung Cancer: N/A Breast Cancer: < 1 year ago.  At the breast center.     Outpatient Encounter Medications as of 11/26/2023  Medication Sig   acetaminophen  (TYLENOL ) 650 MG CR tablet Take 500 mg by mouth every 8 (eight) hours  as needed for pain.   indomethacin  (INDOCIN  SR) 75 MG CR capsule TAKE 1 CAPSULE BY MOUTH TWICE A DAY WITH MEALS AS NEEDED   Multiple Vitamins-Minerals (VITAMIN D3 COMPLETE PO) Take 500 Units by mouth daily.   nortriptyline  (PAMELOR ) 10 MG capsule Take 1 capsule (10 mg total) by mouth at bedtime. Take 10 mg by mouth at bedtime. (Patient taking differently: Take 10 mg by mouth 3 (three) times daily. Take 3 capsules by mouth at every morning.)   [DISCONTINUED] losartan -hydrochlorothiazide  (HYZAAR) 50-12.5 MG tablet Take 1 tablet by mouth daily. Must keep appt 06/26/23 for future refills   losartan -hydrochlorothiazide  (HYZAAR) 50-12.5 MG tablet Take 1 tablet by mouth daily. Must keep appt 06/26/23 for future refills   [DISCONTINUED] benzonatate  (TESSALON ) 100 MG capsule Take 1 capsule (100 mg total) by mouth every 8 (eight) hours as needed for cough.   No facility-administered encounter medications on file as of 11/26/2023.    Past Medical History:  Diagnosis Date   ALLERGIC RHINITIS 10/14/2008   ANXIETY 10/14/2008   ASTHMA 10/14/2008   DEGENERATIVE JOINT DISEASE 10/14/2008   GERD 10/14/2008   HYPERLIPIDEMIA 10/14/2008   INTERMITTENT VERTIGO 11/03/2008   Kidney stones 09/02/2019   NEPHROLITHIASIS, HX OF 10/14/2008    Past Surgical History:  Procedure Laterality Date   APPENDECTOMY     CESAREAN SECTION     s/p left knee surgery     s/p left ulnar nerve surgery  x's 2   s/p nasal surgery     x's 2   s/p right knee surgery     x's 4- Cartilage torn   TONSILLECTOMY      Family History  Problem Relation Age of Onset   Diabetes Maternal Grandfather    Cancer Maternal Grandfather     Social History   Socioeconomic History   Marital status: Single    Spouse name: Not on file   Number of children: Not on file   Years of education: Not on file   Highest education level: Not on file  Occupational History   Not on file  Tobacco Use   Smoking status: Never   Smokeless tobacco:  Never  Vaping Use   Vaping status: Never Used  Substance and Sexual Activity   Alcohol use: No   Drug use: Not on file   Sexual activity: Not on file  Other Topics Concern   Not on file  Social History Narrative   Not on file   Social Drivers of Health   Financial Resource Strain: Low Risk  (01/25/2023)   Received from [facility]   Overall Financial Resource Strain (CARDIA)    Difficulty of Paying Living Expenses: Not hard at all  Food Insecurity: Low Risk  (11/07/2023)   Received from [facility]   Hunger Vital Sign    Worried About Running Out of Food in the Last Year: Never true    Ran Out of Food in the Last Year: Never true  Transportation Needs: No Transportation Needs (11/07/2023)   Received from [facility]    In the past 12 months, has lack of reliable transportation kept you from medical appointments, meetings, work or from getting things needed for daily living? : No  Physical Activity: Not on file  Stress: Not on file  Social Connections: Unknown (03/23/2022)   Received from [facility], [facility]   Social Network    Social Network: Not on file  Intimate Partner Violence: Unknown (02/12/2022)   Received from [facility], [facility]   HITS    Physically Hurt: Not on file    Insult or Talk Down To: Not on file    Threaten Physical Harm: Not on file    Scream or Curse: Not on file    ROS     Objective   BP 125/84   Pulse (!) 106   Ht 5' 8.5 (1.74 m)   Wt 296 lb 1.9 oz (134.3 kg)   LMP 06/10/2015   SpO2 99%   BMI 44.37 kg/m   Physical Exam General: Alert, oriented HEENT: PERRLA, EOMI, moist mucosa.  No conjunctival erythema.  No swelling noted on the left upper eyelid. CV: Regular rhythm Pulmonary: Lungs clear bilaterally MSK: Wearing a back brace.  Antalgic gait. Extremities: No pedal edema Skin: Warm and dry Psych: Pleasant affect.     Assessment & Plan:   Encounter to establish  care  Vitamin D  deficiency -     VITAMIN D  25 Hydroxy (Vit-D Deficiency, Fractures)  B12 deficiency -     B12 and Folate Panel  Need for influenza vaccination -     Flu vaccine trivalent PF, 6mos and older(Flulaval,Afluria,Fluarix,Fluzone)  Essential hypertension Assessment & Plan: At goal.  Refill her medication today.   Neurogenic claudication due to lumbar spinal stenosis Assessment & Plan: Laminectomy w/ interbody fusion performed 12/26.  Currently receiving home PT weekly.  Takes gabapentin for neuropathy.   Vertiginous  migraine Assessment & Plan: Patient takes nortriptyline  for migraines.  Continue current medication.   Chalazion left upper eyelid Assessment & Plan: Complaining of 2 days of feeling like there is a tender area of swelling in her left upper eyelid.  Nothing concerning seen on exam.  Recommended use of warm compresses and gentle massage.  Follow-up as needed.   Dry eyes Assessment & Plan: Takes over-the-counter lubricating eyedrops.   Other orders -     Losartan  Potassium-HCTZ; Take 1 tablet by mouth daily. Must keep appt 06/26/23 for future refills  Dispense: 90 tablet; Refill: 1    Return in about 2 months (around 01/24/2024) for HTN .   Toribio MARLA Slain, MD

## 2023-11-26 NOTE — Assessment & Plan Note (Signed)
 Patient takes nortriptyline for migraines.  Continue current medication.

## 2023-11-26 NOTE — Assessment & Plan Note (Signed)
 At goal.  Refill her medication today.

## 2023-11-26 NOTE — Assessment & Plan Note (Signed)
 Complaining of 2 days of feeling like there is a tender area of swelling in her left upper eyelid.  Nothing concerning seen on exam.  Recommended use of warm compresses and gentle massage.  Follow-up as needed.

## 2023-11-26 NOTE — Patient Instructions (Signed)
 It was nice to see you today,  We addressed the following topics today: -I have ordered a recheck of your B12 and vitamin D  levels since they have been low in the past. - I have sent in your refill of your blood pressure medication - I would like to see you back in 2 months  Have a great day,  Rolan Slain, MD

## 2023-11-26 NOTE — Assessment & Plan Note (Signed)
 Laminectomy w/ interbody fusion performed 12/26.  Currently receiving home PT weekly.  Takes gabapentin for neuropathy.

## 2023-11-26 NOTE — Assessment & Plan Note (Signed)
 Takes over-the-counter lubricating eyedrops.

## 2024-01-06 ENCOUNTER — Other Ambulatory Visit: Payer: Self-pay | Admitting: Obstetrics and Gynecology

## 2024-01-06 DIAGNOSIS — Z1231 Encounter for screening mammogram for malignant neoplasm of breast: Secondary | ICD-10-CM

## 2024-01-14 ENCOUNTER — Ambulatory Visit: Payer: 59

## 2024-01-15 ENCOUNTER — Ambulatory Visit
Admission: RE | Admit: 2024-01-15 | Discharge: 2024-01-15 | Disposition: A | Discharge status: Home / Self Care | Payer: 59 | Source: Ambulatory Visit | Attending: Obstetrics and Gynecology

## 2024-01-15 DIAGNOSIS — Z1231 Encounter for screening mammogram for malignant neoplasm of breast: Secondary | ICD-10-CM

## 2024-01-22 ENCOUNTER — Emergency Department (HOSPITAL_BASED_OUTPATIENT_CLINIC_OR_DEPARTMENT_OTHER): Admitting: Radiology

## 2024-01-22 ENCOUNTER — Emergency Department (HOSPITAL_BASED_OUTPATIENT_CLINIC_OR_DEPARTMENT_OTHER)
Admission: EM | Admit: 2024-01-22 | Discharge: 2024-01-22 | Disposition: A | Discharge status: Home / Self Care | Attending: Emergency Medicine | Admitting: Emergency Medicine

## 2024-01-22 ENCOUNTER — Emergency Department (HOSPITAL_BASED_OUTPATIENT_CLINIC_OR_DEPARTMENT_OTHER)

## 2024-01-22 ENCOUNTER — Other Ambulatory Visit: Payer: Self-pay

## 2024-01-22 ENCOUNTER — Encounter (HOSPITAL_BASED_OUTPATIENT_CLINIC_OR_DEPARTMENT_OTHER): Payer: Self-pay | Admitting: Emergency Medicine

## 2024-01-22 DIAGNOSIS — R609 Edema, unspecified: Secondary | ICD-10-CM | POA: Diagnosis not present

## 2024-01-22 DIAGNOSIS — R2242 Localized swelling, mass and lump, left lower limb: Secondary | ICD-10-CM | POA: Diagnosis present

## 2024-01-22 LAB — D-DIMER, QUANTITATIVE: D-Dimer, Quant: 1.08 ug{FEU}/mL — ABNORMAL HIGH (ref 0.00–0.50)

## 2024-01-22 LAB — CBC
HCT: 41.6 % (ref 36.0–46.0)
Hemoglobin: 13.8 g/dL (ref 12.0–15.0)
MCH: 28.9 pg (ref 26.0–34.0)
MCHC: 33.2 g/dL (ref 30.0–36.0)
MCV: 87.2 fL (ref 80.0–100.0)
Platelets: 430 10*3/uL — ABNORMAL HIGH (ref 150–400)
RBC: 4.77 MIL/uL (ref 3.87–5.11)
RDW: 12.5 % (ref 11.5–15.5)
WBC: 8 10*3/uL (ref 4.0–10.5)
nRBC: 0 % (ref 0.0–0.2)

## 2024-01-22 LAB — BASIC METABOLIC PANEL
Anion gap: 8 (ref 5–15)
BUN: 12 mg/dL (ref 8–23)
CO2: 24 mmol/L (ref 22–32)
Calcium: 9.4 mg/dL (ref 8.9–10.3)
Chloride: 106 mmol/L (ref 98–111)
Creatinine, Ser: 0.6 mg/dL (ref 0.44–1.00)
GFR, Estimated: >60 mL/min (ref >60)
Glucose, Bld: 104 mg/dL — ABNORMAL HIGH (ref 70–99)
Potassium: 3.9 mmol/L (ref 3.5–5.1)
Sodium: 138 mmol/L (ref 135–145)

## 2024-01-22 LAB — TROPONIN I (HIGH SENSITIVITY): Troponin I (High Sensitivity): 4 ng/L (ref <18)

## 2024-01-22 MED ORDER — POTASSIUM CHLORIDE CRYS ER 20 MEQ PO TBCR: 20.0000 meq | EXTENDED_RELEASE_TABLET | Freq: Two times a day (BID) | ORAL | 0 refills | Status: DC

## 2024-01-22 MED ORDER — FUROSEMIDE 20 MG PO TABS: 40.0000 mg | ORAL_TABLET | Freq: Every day | ORAL | 0 refills | Status: DC

## 2024-01-22 MED ORDER — IOHEXOL 350 MG/ML SOLN
100.0000 mL | Freq: Once | INTRAVENOUS | Status: AC | PRN
  Administered 2024-01-22: 100 mL via INTRAVENOUS

## 2024-01-22 NOTE — Discharge Instructions (Addendum)
 Evaluation was overall reassuring.  Ultrasound was negative for DVT.  Chest CT was also negative for clot in the lungs.  For your swelling I am going to start you on a 5-day course of Lasix and potassium supplement.  Recommend you follow-up with your surgeon and your PCP.  If you have worsening swelling, develop chest pain or shortness of breath or any other concerning symptom please return to the emergency department further evaluation.

## 2024-01-22 NOTE — ED Triage Notes (Signed)
 Pt via pov from home with leg and foot swelling (bilaterally) as well as her hand, that has just begun swelling. Pt had back surgery in December, called her surgeon, was told to come and get check for blood clots and if that was negative to "ask for a fluid pill." Pt states this has been going on for 3 weeks. Pt denies sob. Pt alert & oriented, nad noted.

## 2024-01-22 NOTE — ED Provider Notes (Signed)
 [place] EMERGENCY DEPARTMENT AT [facility] Provider Note   CSN: [id number] Arrival date & time: 01/22/24  1231     History  Chief Complaint  Patient presents with   Leg Swelling   HPI [name] is a 63 y.o. female s/p lumbar laminectomy with interbody fusion in December of last year presenting for swelling.  Started a few weeks ago.  States the swelling is primarily in her legs but notably worse in the right leg.  She denies calf tenderness or any pain in lower extremities.  Also reports swelling in her hands as well.  Denies chest pain shortness of breath.  She states she contacted her surgeon today because "following has been going on for very long time".  He recommended that she come to the ED to check for blood clots and also remarking that she received "a fluid pill".  She states that she has been ambulating every day, denies recent long trips or immobilization aside from her surgery in December.  Past Medical History:  Diagnosis Date   ALLERGIC RHINITIS 10/14/2008   ANXIETY 10/14/2008   ASTHMA 10/14/2008   DEGENERATIVE JOINT DISEASE 10/14/2008   GERD 10/14/2008   HYPERLIPIDEMIA 10/14/2008   INTERMITTENT VERTIGO 11/03/2008   Kidney stones 09/02/2019   NEPHROLITHIASIS, HX OF 10/14/2008     HPI     Home Medications Prior to Admission medications   Medication Sig Start Date End Date Taking? Authorizing Provider  furosemide (LASIX) 20 MG tablet Take 2 tablets (40 mg total) by mouth daily for 5 days. 01/22/24 01/27/24 Yes Gareth Eagle, PA-C  potassium chloride SA (KLOR-CON M) 20 MEQ tablet Take 1 tablet (20 mEq total) by mouth 2 (two) times daily for 5 days. 01/22/24 01/27/24 Yes Gareth Eagle, PA-C  acetaminophen (TYLENOL) 650 MG CR tablet Take 500 mg by mouth every 8 (eight) hours as needed for pain.    [provider]  indomethacin (INDOCIN SR) 75 MG CR capsule TAKE 1 CAPSULE BY MOUTH TWICE A DAY WITH MEALS AS NEEDED 08/12/23   Corwin Levins,  MD  losartan-hydrochlorothiazide (HYZAAR) 50-12.5 MG tablet Take 1 tablet by mouth daily. Must keep appt 06/26/23 for future refills 11/26/23   Sandre Kitty, MD  Multiple Vitamins-Minerals (VITAMIN D3 COMPLETE PO) Take 500 Units by mouth daily.    [provider]  nortriptyline (PAMELOR) 10 MG capsule Take 1 capsule (10 mg total) by mouth at bedtime. Take 10 mg by mouth at bedtime. Patient taking differently: Take 10 mg by mouth 3 (three) times daily. Take 3 capsules by mouth at every morning. 08/27/19   Corwin Levins, MD      Allergies    Codeine and Tramadol    Review of Systems   See HPI   Physical Exam   Vitals:   01/22/24 2000 01/22/24 2044  BP: (!) 160/82   Pulse: 98   Resp: 15   Temp:  98.6 F (37 C)  SpO2: 100%     CONSTITUTIONAL:  well-appearing, NAD NEURO:  Alert and oriented x 3, CN 3-12 grossly intact EYES:  eyes equal and reactive ENT/NECK:  Supple, no stridor  CARDIO:  Regular rate and rhythm, appears well-perfused, radial and pedal pulses are 2+ bilaterally PULM:  No respiratory distress, CTAB GI/GU:  non-distended, soft MSK/SPINE:  No gross deformities, edema noted in the hands and lower extremities bilaterally, nonpitting, moves all extremities  SKIN:  no rash, atraumatic  *Additional and/or pertinent findings included in MDM  below   ED Results / Procedures / Treatments   Labs (all labs ordered are listed, but only abnormal results are displayed) Labs Reviewed  BASIC METABOLIC PANEL - Abnormal; Notable for the following components:      Result Value   Glucose, Bld 104 (*)    All other components within normal limits  CBC - Abnormal; Notable for the following components:   Platelets 430 (*)    All other components within normal limits  D-DIMER, QUANTITATIVE - Abnormal; Notable for the following components:   D-Dimer, Quant 1.08 (*)    All other components within normal limits  TROPONIN I (HIGH SENSITIVITY)    EKG EKG  Interpretation Date/Time:  Wednesday January 22 2024 13:17:19 EDT Ventricular Rate:  97 PR Interval:  144 QRS Duration:  82 QT Interval:  328 QTC Calculation: 416 R Axis:   13  Text Interpretation: Normal sinus rhythm Anterolateral infarct , age undetermined Abnormal ECG When compared with ECG of 12-Oct-2016 09:08, Anterolateral infarct is now Present Nonspecific T wave abnormality now evident in Inferior leads Nonspecific T wave abnormality now evident in Anterolateral leads No significant change since last tracing Confirmed by Vanetta Mulders [phone]) on 01/22/2024 9:16:27 PM  Radiology CT Angio Chest PE W/Cm &/Or Wo Cm Result Date: 01/22/2024 CLINICAL DATA:  Positive D-dimer edema EXAM: CT ANGIOGRAPHY CHEST WITH CONTRAST TECHNIQUE: Multidetector CT imaging of the chest was performed using the standard protocol during bolus administration of intravenous contrast. Multiplanar CT image reconstructions and MIPs were obtained to evaluate the vascular anatomy. RADIATION DOSE REDUCTION: This exam was performed according to the departmental dose-optimization program which includes automated exposure control, adjustment of the mA and/or kV according to patient size and/or use of iterative reconstruction technique. CONTRAST:  OMNIPAQUE IOHEXOL 350 MG/ML SOLN COMPARISON:  Chest x-ray 01/22/2024 FINDINGS: Cardiovascular: Satisfactory opacification of the pulmonary arteries to the segmental level. No evidence of pulmonary embolism. Normal heart size. No pericardial effusion. Nonaneurysmal aorta. No dissection is seen. Mediastinum/Nodes: No enlarged mediastinal, hilar, or axillary lymph nodes. Thyroid gland, trachea, and esophagus demonstrate no significant findings. Lungs/Pleura: Lungs are clear. No pleural effusion or pneumothorax. Upper Abdomen: No acute finding. Punctate nonobstructing left kidney stone. Musculoskeletal: No acute osseous abnormality Review of the MIP images confirms the above findings.  IMPRESSION: 1. Negative. No CT evidence for acute pulmonary embolus. Clear lung fields. 2. Punctate nonobstructing left kidney stone. Electronically Signed   By: Jasmine Pang M.D.   On: 01/22/2024 20:22   DG Chest 2 View Result Date: 01/22/2024 CLINICAL DATA:  Edema EXAM: CHEST - 2 VIEW COMPARISON:  10/12/2016 FINDINGS: The heart size and mediastinal contours are within normal limits. Slightly low lung volumes. No focal airspace consolidation, pleural effusion, or pneumothorax. The visualized skeletal structures are unremarkable. IMPRESSION: No active cardiopulmonary disease. Electronically Signed   By: Duanne Guess D.O.   On: 01/22/2024 16:13   US Venous Img Lower Right (DVT Study) Result Date: 01/22/2024 CLINICAL DATA:  Right lower extremity edema. EXAM: RIGHT LOWER EXTREMITY VENOUS DOPPLER ULTRASOUND TECHNIQUE: Gray-scale sonography with graded compression, as well as color Doppler and duplex ultrasound were performed to evaluate the lower extremity deep venous systems from the level of the common femoral vein and including the common femoral, femoral, profunda femoral, popliteal and calf veins including the posterior tibial, peroneal and gastrocnemius veins when visible. The superficial great saphenous vein was also interrogated. Spectral Doppler was utilized to evaluate flow at rest and with distal augmentation maneuvers in the common  femoral, femoral and popliteal veins. COMPARISON:  None Available. FINDINGS: Contralateral Common Femoral Vein: Respiratory phasicity is normal and symmetric with the symptomatic side. No evidence of thrombus. Normal compressibility. Common Femoral Vein: No evidence of thrombus. Normal compressibility, respiratory phasicity and response to augmentation. Saphenofemoral Junction: No evidence of thrombus. Normal compressibility and flow on color Doppler imaging. Profunda Femoral Vein: No evidence of thrombus. Normal compressibility and flow on color Doppler imaging.  Femoral Vein: No evidence of thrombus. Normal compressibility, respiratory phasicity and response to augmentation. Popliteal Vein: No evidence of thrombus. Normal compressibility, respiratory phasicity and response to augmentation. Calf Veins: No evidence of thrombus. Normal compressibility and flow on color Doppler imaging. Superficial Great Saphenous Vein: No evidence of thrombus. Normal compressibility. Venous Reflux:  None. Other Findings: No evidence of superficial thrombophlebitis or abnormal fluid collection. IMPRESSION: No evidence of right lower extremity deep venous thrombosis. Electronically Signed   By: Irish Lack M.D.   On: 01/22/2024 15:54    Procedures Procedures    Medications Ordered in ED Medications  iohexol (OMNIPAQUE) 350 MG/ML injection 100 mL (100 mLs Intravenous Contrast Given 01/22/24 1726)    ED Course/ Medical Decision Making/ A&P                                 Medical Decision Making Amount and/or Complexity of Data Reviewed Labs: ordered. Radiology: ordered.  Risk Prescription drug management.   Initial Impression and Ddx 63 year old well-appearing female presenting for swelling.  Exam notable for edema in the lower legs and hands.  DDx includes PE, CHF exacerbation, DVT, venous stasis, electrolyte derangement, other. Patient PMH that increases complexity of ED encounter:  recent surgery  Interpretation of Diagnostics - I independent reviewed and interpreted the labs as followed: elevated d dimer  - I independently visualized the following imaging with scope of interpretation limited to determining acute life threatening conditions related to emergency care: CT angio chest, which was negative for PE, RLE [place] was negative for DVT, CXR without acute findings  - I personally reviewed and interpreted EKG which revealed NSR  Patient Reassessment and Ultimate Disposition/Management Workup was largely unremarkable.  On reassessment patient remained  without chest pain or shortness of breath.  CT angio of the chest was negative for PE.  DVT study was also negative.  Started on on a 5-day course of p.o. Lasix and potassium supplement.  Advised her to follow-up with her surgeon and PCP.  Discussed return precautions.  Discharged in good condition.  Patient management required discussion with the following services or consulting groups:  None  Complexity of Problems Addressed Acute complicated illness or Injury  Additional Data Reviewed and Analyzed Further history obtained from: Past medical history and medications listed in the EMR and Prior ED visit notes  Patient Encounter Risk Assessment Prescriptions         Final Clinical Impression(s) / ED Diagnoses Final diagnoses:  Edema, unspecified type    Rx / DC Orders ED Discharge Orders          Ordered    furosemide (LASIX) 20 MG tablet  Daily        01/22/24 2212    potassium chloride SA (KLOR-CON M) 20 MEQ tablet  2 times daily        01/22/24 2212              Gareth Eagle, PA-C 01/22/24 2213    Vanetta Mulders,  MD 01/25/24 1625

## 2024-01-22 NOTE — ED Notes (Signed)
 Patient transported to CT

## 2024-01-23 ENCOUNTER — Telehealth (HOSPITAL_BASED_OUTPATIENT_CLINIC_OR_DEPARTMENT_OTHER): Payer: Self-pay | Admitting: Emergency Medicine

## 2024-01-23 DIAGNOSIS — R609 Edema, unspecified: Secondary | ICD-10-CM

## 2024-01-23 MED ORDER — FUROSEMIDE 20 MG PO TABS: 40.0000 mg | ORAL_TABLET | Freq: Every day | ORAL | 0 refills | Status: DC

## 2024-01-23 NOTE — Telephone Encounter (Cosign Needed)
 Patient stated that the Lasix order provided to the pharmacy only allowed for 5 tablets of Lasix.  I changed the order to 10 tablets for 5 days of 20 mg twice daily.

## 2024-01-24 ENCOUNTER — Ambulatory Visit: Payer: 59 | Admitting: Family Medicine

## 2024-01-30 ENCOUNTER — Ambulatory Visit: Payer: 59 | Admitting: Family Medicine

## 2024-02-26 ENCOUNTER — Encounter: Payer: Self-pay | Admitting: Family Medicine

## 2024-02-26 ENCOUNTER — Ambulatory Visit: Admitting: Family Medicine

## 2024-02-26 VITALS — BP 135/86 | HR 113 | Ht 68.5 in | Wt 299.1 lb

## 2024-02-26 DIAGNOSIS — E559 Vitamin D deficiency, unspecified: Secondary | ICD-10-CM

## 2024-02-26 DIAGNOSIS — M48062 Spinal stenosis, lumbar region with neurogenic claudication: Secondary | ICD-10-CM

## 2024-02-26 DIAGNOSIS — E538 Deficiency of other specified B group vitamins: Secondary | ICD-10-CM

## 2024-02-26 DIAGNOSIS — G8929 Other chronic pain: Secondary | ICD-10-CM

## 2024-02-26 MED ORDER — INDOMETHACIN ER 75 MG PO CPCR: ORAL_CAPSULE | ORAL | 2 refills | Status: DC

## 2024-02-26 MED ORDER — INDOMETHACIN ER 75 MG PO CPCR
ORAL_CAPSULE | ORAL | 2 refills | Status: AC
Start: 2024-03-20 — End: ?

## 2024-02-26 NOTE — Progress Notes (Signed)
   Established Patient Office Visit  Subjective   Patient ID: [name], female    DOB: August 31, 1961  Age: 63 y.o. MRN: [id number]  Chief Complaint  Patient presents with   Medical Management of Chronic Issues    HPI Continues to feel better after her surgery and now states feels like "the surgery never happened" because she feels back to 100%.  Leg pain and back pain is much better.  Patient migraine medications are prescribed by her neurologist.  Patient gets indomethacin  and blood pressure medication prescribed by her PCP.  Used to get 90-day refills of indomethacin  but at some point became 30-day.  She would prefer going back to 90 days.  She has had to take indomethacin  since initially being placed on Bextra many years ago.  She had her vitamin D  checked at the gynecologist office.  We went over the lab values.  Her readings were 36.3.  She currently takes 5000 IUs daily of vitamin D3.  We discussed her B12 values from last year.  Do not have any other values sooner than thbut it was less than 300.  We talked about B12 supplement over-the-counter.  Patient also has issue with sleep.  Has issues with early awakening in having trouble going back to sleep.  States her mother was the same way.  She has tried melatonin.  She has tried white noise and other modifications.  We discussed sleep hygiene and other over-the-counter's including L glycine.    The 10-year ASCVD risk score (Arnett DK, et al., 2019) is: 4.6%  Health Maintenance Due  Topic Date Due   Cervical Cancer Screening (HPV/Pap Cotest)  10/26/2010   Colonoscopy  07/10/2023      Objective:     BP 135/86   Pulse (!) 113   Ht 5' 8.5" (1.74 m)   Wt 299 lb 1.9 oz (135.7 kg)   LMP 06/10/2015   SpO2 97%   BMI 44.82 kg/m    Physical Exam General: Alert, oriented Pulmonary: No respiratory distress Psych: Pleasant affect  No results found for any visits on 02/26/24.      Assessment & Plan:   Neurogenic  claudication due to lumbar spinal stenosis Assessment & Plan: Symptoms are much better after her surgery.  Continue management per neurosurgery.   Vitamin D  deficiency Assessment & Plan: Continue 5000 IU daily over-the-counter vitamin D3   Other chronic pain Assessment & Plan: Continue indomethacin .  Sending in 90-day supplies.  Take it daily.   B12 deficiency Assessment & Plan: Recommending 5000 mcg B12 p.o. daily.   Other orders -     Indomethacin  ER; TAKE 1 CAPSULE BY MOUTH TWICE A DAY WITH MEALS AS NEEDED  Dispense: 180 capsule; Refill: 2     Return in about 6 months (around 08/27/2024) for physical.    Laneta Pintos, MD

## 2024-02-26 NOTE — Patient Instructions (Addendum)
 It was nice to see you today,  We addressed the following topics today: -I have sent in your indomethacin for 90-day prescriptions that you can start picking up May 9.. - For sleep the other over-the-counter treatment you can try is something called L glycine.  Take 3 g before bedtime.  This is available at places like [facility] or vitamin shop.  it is an amino acid. - I would also recommend taking an over-the-counter vitamin B12 supplement.  You will take 5000 mcg daily.  Have a great day,  Etha Henle, MD

## 2024-03-03 DIAGNOSIS — E538 Deficiency of other specified B group vitamins: Secondary | ICD-10-CM | POA: Insufficient documentation

## 2024-03-03 NOTE — Assessment & Plan Note (Signed)
 Recommending 5000 mcg B12 p.o. daily.

## 2024-03-03 NOTE — Assessment & Plan Note (Signed)
 Continue 5000 IU daily over-the-counter vitamin D3

## 2024-03-03 NOTE — Assessment & Plan Note (Signed)
 Continue indomethacin .  Sending in 90-day supplies.  Take it daily.

## 2024-03-03 NOTE — Assessment & Plan Note (Signed)
 Symptoms are much better after her surgery.  Continue management per neurosurgery.

## 2024-03-24 ENCOUNTER — Ambulatory Visit: Payer: Self-pay

## 2024-03-24 NOTE — Telephone Encounter (Signed)
 Copied from CRM [id number]. Topic: Clinical - Red Word Triage >> Mar 24, 2024  2:36 PM Lizabeth Riggs wrote: Red Word that prompted transfer to Nurse Triage:  Both ankles are swollen and right is worse - pink in color - pain level at a 10   Chief Complaint: Ankle swelling Symptoms: Bilateral ankle swelling, leg pain Frequency: Intermittent  Disposition: [] ED /[] Urgent Care (no appt availability in office) / [x] Appointment(In office/virtual)/ []  [facility]/ [] Home Care/ [] Refused Recommended Disposition /[] [facility]/ []  Follow-up with PCP Additional Notes: Patient calling to schedule an appointment with Dr. Arabella Beach for evaluation of her ankle swelling. She states the swelling has been going on "for a while" and that it usually occurs at night. She states she was seen in the ED 2 months ago for the same and had a negative ultrasound at that time. Patient became frustrated during triage and asked me to make an appointment for her. Appointment made for 7/7 and added to wait list.    Reason for Disposition  Ankle swelling is a chronic symptom (recurrent or ongoing AND present > 4 weeks)    Scheduled for first available in July per patient request  Answer Assessment - Initial Assessment Questions 1. LOCATION: "Which ankle is swollen?" "Where is the swelling?"     Bilateral ankles  2. ONSET: "When did the swelling start?"     Intermittent, happens at night  3. SWELLING: "How bad is the swelling?" Or, "How large is it?" (e.g., mild, moderate, severe; size of localized swelling)    - NONE: No joint swelling.   - LOCALIZED: Localized; small area of puffy or swollen skin (e.g., insect bite, skin irritation).   - MILD: Joint looks or feels mildly swollen or puffy.   - MODERATE: Swollen; interferes with normal activities (e.g., work or school); decreased range of movement; may be limping.   - SEVERE: Very swollen; can't move swollen joint at all; limping a lot or unable to walk.      Mild to moderate  Protocols used: Ankle Swelling-A-AH

## 2024-05-18 ENCOUNTER — Ambulatory Visit: Admitting: Family Medicine

## 2024-05-19 ENCOUNTER — Other Ambulatory Visit: Payer: Self-pay | Admitting: Family Medicine

## 2024-06-19 ENCOUNTER — Ambulatory Visit: Payer: Self-pay

## 2024-06-19 NOTE — Telephone Encounter (Signed)
 FYI Only or Action Required?: Action required by provider: request for appointment.  Patient was last seen in primary care on 02/26/2024 by Chandra Toribio POUR, MD.  Called Nurse Triage reporting Leg pain.  Symptoms began several months ago.  Interventions attempted: Nothing.  Symptoms are: gradually worsening.Bilateral leg pain, knees down. No availability, asking to be worked in.  Triage Disposition: See PCP When Office is Open (Within 3 Days)  Patient/caregiver understands and will follow disposition?: Yes   Copied from CRM #[id number]. Topic: Clinical - Red Word Triage >> Jun 19, 2024  3:12 PM Delon HERO wrote: Red Word that prompted transfer to Nurse Triage: Patient is calling to report pain in left & Right legs with occasional swelling  for several months. Patient has had previous 02/03/2024 appointment testing that ruled out blood clots. Reason for Disposition  [1] MODERATE pain (e.g., interferes with normal activities, limping) AND [2] present > 3 days  Answer Assessment - Initial Assessment Questions 1. ONSET: When did the pain start?      March 2. LOCATION: Where is the pain located?      Both legs 3. PAIN: How bad is the pain?    (Scale 1-10; or mild, moderate, severe)     9 4. WORK OR EXERCISE: Has there been any recent work or exercise that involved this part of the body?      no 5. CAUSE: What do you think is causing the leg pain?     unsure 6. OTHER SYMPTOMS: Do you have any other symptoms? (e.g., chest pain, back pain, breathing difficulty, swelling, rash, fever, numbness, weakness)     swelling 7. PREGNANCY: Is there any chance you are pregnant? When was your last menstrual period?     no  Protocols used: Leg Pain-A-AH

## 2024-06-22 NOTE — Telephone Encounter (Signed)
 Has the patient tried to schedule an appointment with her orthopedist? I can see her if she would prefer to see me  if we have any openings but her orthopedist is better equipped to manage her leg pain.

## 2024-06-24 ENCOUNTER — Encounter: Payer: Self-pay | Admitting: Family Medicine

## 2024-06-24 ENCOUNTER — Ambulatory Visit: Admitting: Family Medicine

## 2024-06-24 VITALS — BP 121/80 | HR 102 | Ht 68.5 in | Wt 305.0 lb

## 2024-06-24 DIAGNOSIS — M79662 Pain in left lower leg: Secondary | ICD-10-CM | POA: Diagnosis not present

## 2024-06-24 DIAGNOSIS — M79661 Pain in right lower leg: Secondary | ICD-10-CM | POA: Diagnosis not present

## 2024-06-24 DIAGNOSIS — M79669 Pain in unspecified lower leg: Secondary | ICD-10-CM | POA: Insufficient documentation

## 2024-06-24 DIAGNOSIS — R6889 Other general symptoms and signs: Secondary | ICD-10-CM | POA: Diagnosis not present

## 2024-06-24 MED ORDER — CILOSTAZOL 100 MG PO TABS: 100.0000 mg | ORAL_TABLET | Freq: Two times a day (BID) | ORAL | 2 refills | Status: DC

## 2024-06-24 NOTE — Patient Instructions (Signed)
 It was nice to see you today,  We addressed the following topics today: -I am sending you to the vein and vascular specialist regarding possible peripheral artery disease in your legs causing your leg pain. - Your Doppler ultrasound from 2019 showed possible peripheral artery disease so I am going to go ahead and start cilostazol  before you see them. - Cilostazol  has antiplatelet effects just like your NSAID.  Also your baby aspirin, so I would recommend stopping your baby aspirin while you take the cilostazol .  If you notice any signs of bleeding or bruising let us  know.  This includes dark tarry stools or bright red blood in stools.  Have a great day,  Rolan Slain, MD

## 2024-06-24 NOTE — Assessment & Plan Note (Signed)
 Likely claudication secondary to peripheral artery disease. A 2019 TBI was abnormal bilaterally ( < 0.7), suggestive of PAD. Neurogenic claudication was previously considered but is less likely given the improvement in back pain post-surgery w/o improvement in leg pain.   - Start cilostazol  empirically. Counseled on increased bleeding risk, especially with concurrent NSAID use. - Stop baby aspirin while taking cilostazol . - Continue gabapentin, primarily at night. - Continue indomethacin  (pt unable to come off of this. Has been on it for several years). - Refer to Vascular Surgery for further evaluation and management. - Discussed walking therapy and endovascular procedures as other treatment options.

## 2024-06-24 NOTE — Progress Notes (Signed)
   Established Patient Office Visit  Subjective   Patient ID: [name], female    DOB: [birth year]  Age: 63 y.o. MRN: [id number]  Chief Complaint  Patient presents with   Leg Pain    HPI  Subjective - Persistent leg pain, same as before back surgery, which improved post-operatively with medication but returned after stopping it. Back pain is significantly improved. Leg swelling has improved, but the pain has not. - Pain is described as a combination of aching, sharp, burning, and tingling, primarily on the lateral aspect of the lower legs. - Pain is worse with activity and prolonged standing, and better with rest. - Using a massage gun and Aspercreme with lidocaine  provides minimal relief. - Pain is severe, causing hollering with light touch, and is worse than his knee pain.  Medications: Current medications include gabapentin 2-3 tablets at night for relief, indomethacin  twice daily, and baby aspirin. Hydrocodone  was taken post-operatively but is no longer being used.   ROS: Constitutional: Denies fever, chills. Cardiovascular: Denies known heart issues. Reports occasional light pink discoloration of the skin over the painful area on the leg, but it is not warm to the touch. Musculoskeletal: Reports severe bilateral leg pain, worse with activity. Bilateral knee pain. History of back pain, now improved post-surgery. History of neck surgery. Integumentary: Notes some spider veins but no varicose veins. Neurological: Describes pain as aching, sharp, burning, and tingling in the legs.    The 10-year ASCVD risk score (Arnett DK, et al., 2019) is: 3.7%  Health Maintenance Due  Topic Date Due   Pneumococcal Vaccine: 50+ Years (1 of 2 - PCV) Never done   Cervical Cancer Screening (HPV/Pap Cotest)  10/26/2010   Colonoscopy  07/10/2023   INFLUENZA VACCINE  06/12/2024      Objective:     BP 121/80   Pulse (!) 102   Ht 5' 8.5 (1.74 m)   Wt (!) 305 lb (138.3 kg)   LMP  06/10/2015   SpO2 96%   BMI 45.70 kg/m    Physical Exam Gen: alert, oriented Pulm: no resp distress MSK: Tenderness to palpation over the lateral aspect of the lower legs, greater than the medial aspect.   No results found for any visits on 06/24/24.      Assessment & Plan:   Pain in both lower legs Assessment & Plan:  Likely claudication secondary to peripheral artery disease. A 2019 TBI was abnormal bilaterally ( < 0.7), suggestive of PAD. Neurogenic claudication was previously considered but is less likely given the improvement in back pain post-surgery w/o improvement in leg pain.   - Start cilostazol  empirically. Counseled on increased bleeding risk, especially with concurrent NSAID use. - Stop baby aspirin while taking cilostazol . - Continue gabapentin, primarily at night. - Continue indomethacin  (pt unable to come off of this. Has been on it for several years). - Refer to Vascular Surgery for further evaluation and management. - Discussed walking therapy and endovascular procedures as other treatment options.   Abnormal ankle brachial index (ABI) -     Ambulatory referral to Vascular Surgery  Other orders -     Cilostazol ; Take 1 tablet (100 mg total) by mouth 2 (two) times daily.  Dispense: 60 tablet; Refill: 2     No follow-ups on file.    Toribio MARLA Slain, MD

## 2024-07-09 ENCOUNTER — Ambulatory Visit: Payer: Self-pay

## 2024-07-09 ENCOUNTER — Ambulatory Visit (HOSPITAL_COMMUNITY)

## 2024-07-09 NOTE — Telephone Encounter (Signed)
 FYI Only or Action Required?: Action required by provider: clinical question for provider and update on patient condition.  Patient was last seen in primary care on 06/24/2024 by Chandra Toribio POUR, MD.  Called Nurse Triage reporting No chief complaint on file..  This RN attempted to contact patient for triage. No answer, voicemail left requesting return call to clinic.    Patient/caregiver understands and will follow disposition?:   Copied from CRM [id number]. Topic: Clinical - Medication Question >> Jul 09, 2024 11:58 AM Antwanette L wrote: Reason for CRM: The patient has had a cough since last  Friday. The patient wants to know if Dr. Chandra can prescribe some medicine? Please follow up w/ the patient at [phone]

## 2024-07-09 NOTE — Telephone Encounter (Signed)
 FYI Only or Action Required?: Action required by provider: Rx request.  Patient was last seen in primary care on 06/24/2024 by Chandra Toribio POUR, MD.  Called Nurse Triage reporting Cough.  Symptoms began several days ago.  Interventions attempted: OTC medications: delsym, mucinex without relief.  Symptoms are: unchanged.  Triage Disposition: See Physician Within 24 Hours  Patient/caregiver understands and will follow disposition?: No, wishes to speak with PCP        Reason for Disposition  SEVERE coughing spells (e.g., whooping sound after coughing, vomiting after coughing)  Answer Assessment - Initial Assessment Questions 1. ONSET: When did the cough begin?      Friday 2. SEVERITY: How bad is the cough today?      Just terrible 3. SPUTUM: Describe the color of your sputum (e.g., none, dry cough; clear, white, yellow, green)     Dry, but keep getting choked  Endorses going into dry heave  4. HEMOPTYSIS: Are you coughing up any blood? If Yes, ask: How much? (e.g., flecks, streaks, tablespoons, etc.)     denies 5. DIFFICULTY BREATHING: Are you having difficulty breathing? If Yes, ask: How bad is it? (e.g., mild, moderate, severe)      denies 6. FEVER: Do you have a fever? If Yes, ask: What is your temperature, how was it measured, and when did it start?     denies 7. CARDIAC HISTORY: Do you have any history of heart disease? (e.g., heart attack, congestive heart failure)       denies 8. LUNG HISTORY: Do you have any history of lung disease?  (e.g., pulmonary embolus, asthma, emphysema)     Asthma per chart, but pt denies hx 9. PE RISK FACTORS: Do you have a history of blood clots? (or: recent major surgery, recent prolonged travel, bedridden)     denies 10. OTHER SYMPTOMS: Do you have any other symptoms? (e.g., runny nose, wheezing, chest pain)       Runny nose, denies other sx 11. PREGNANCY: Is there any chance you are pregnant? When was  your last menstrual period?       N/a 12. TRAVEL: Have you traveled out of the country in the last month? (e.g., travel history, exposures)       N/a    Pt reports recently saw PCP in office and requesting rx for cough. Pt reports benzonatate  (TESSALON ) 100 MG capsule has helped her in the past and would like a refill.   Triager will forward encounter for Dr Chandra 's office to review and advise. Patient verbalized understanding and is expecting call back from office for next steps.  Protocols used: Cough - Acute Non-Productive-A-AH

## 2024-07-10 ENCOUNTER — Other Ambulatory Visit: Payer: Self-pay | Admitting: Family Medicine

## 2024-07-10 MED ORDER — BENZONATATE 100 MG PO CAPS: 100.0000 mg | ORAL_CAPSULE | Freq: Two times a day (BID) | ORAL | 1 refills | Status: DC | PRN

## 2024-07-10 NOTE — Telephone Encounter (Signed)
 Can you lt the patient know I am sending in a refill of the tessalon 

## 2024-07-10 NOTE — Telephone Encounter (Signed)
Called pt she is advised of her Rx that was sent 

## 2024-07-17 ENCOUNTER — Ambulatory Visit (HOSPITAL_COMMUNITY)
Admission: RE | Admit: 2024-07-17 | Discharge: 2024-07-17 | Disposition: A | Discharge status: Home / Self Care | Source: Ambulatory Visit | Attending: Vascular Surgery | Admitting: Vascular Surgery

## 2024-07-17 ENCOUNTER — Other Ambulatory Visit: Payer: Self-pay | Admitting: *Deleted

## 2024-07-17 DIAGNOSIS — M79669 Pain in unspecified lower leg: Secondary | ICD-10-CM | POA: Insufficient documentation

## 2024-07-19 LAB — VAS US ABI WITH/WO TBI
Left ABI: 0.89
Right ABI: 0.87

## 2024-07-20 NOTE — Progress Notes (Unsigned)
 Patient name: [name] MRN: [id number] DOB: 1961-06-03 Sex: female  REASON FOR CONSULT: Bilateral leg pain below-knee previously thought to be neurogenic claudication  HPI: [name] is a 63 y.o. female, with history of hyperlipidemia that presents for evaluation of bilateral leg pain below the knee previously thought to be neurogenic claudication.  This did not improve with lumbar spine surgery.  Past Medical History:  Diagnosis Date   ALLERGIC RHINITIS 10/14/2008   ANXIETY 10/14/2008   ASTHMA 10/14/2008   DEGENERATIVE JOINT DISEASE 10/14/2008   GERD 10/14/2008   HYPERLIPIDEMIA 10/14/2008   INTERMITTENT VERTIGO 11/03/2008   Kidney stones 09/02/2019   NEPHROLITHIASIS, HX OF 10/14/2008    Past Surgical History:  Procedure Laterality Date   APPENDECTOMY     CESAREAN SECTION     s/p left knee surgery     s/p left ulnar nerve surgery     x's 2   s/p nasal surgery     x's 2   s/p right knee surgery     x's 4- Cartilage torn   TONSILLECTOMY      Family History  Problem Relation Age of Onset   Diabetes Maternal Grandfather    Cancer Maternal Grandfather     SOCIAL HISTORY: Social History   Socioeconomic History   Marital status: Single    Spouse name: Not on file   Number of children: Not on file   Years of education: Not on file   Highest education level: Not on file  Occupational History   Not on file  Tobacco Use   Smoking status: Never   Smokeless tobacco: Never  Vaping Use   Vaping status: Never Used  Substance and Sexual Activity   Alcohol use: No   Drug use: Not Currently   Sexual activity: Not on file  Other Topics Concern   Not on file  Social History Narrative   Not on file   Social Drivers of Health   Financial Resource Strain: Low Risk  (01/14/2024)   Received from [facility]   Overall Financial Resource Strain (CARDIA)    Difficulty of Paying Living Expenses: Not hard at all  Food Insecurity: No Food Insecurity (01/14/2024)    Received from [facility]   Hunger Vital Sign    Within the past 12 months, you worried that your food would run out before you got the money to buy more.: Never true    Within the past 12 months, the food you bought just didn't last and you didn't have money to get more.: Never true  Transportation Needs: No Transportation Needs (01/14/2024)   Received from [facility] - Transportation    Lack of Transportation (Medical): No    Lack of Transportation (Non-Medical): No  Physical Activity: Not on file  Stress: Not on file  Social Connections: Unknown (03/23/2022)   Received from [facility]   Social Network    Social Network: Not on file  Intimate Partner Violence: Unknown (02/12/2022)   Received from [facility]   HITS    Physically Hurt: Not on file    Insult or Talk Down To: Not on file    Threaten Physical Harm: Not on file    Scream or Curse: Not on file    Allergies  Allergen Reactions   Codeine     itching   Tramadol  Rash    Current Outpatient Medications  Medication Sig Dispense Refill   acetaminophen  (TYLENOL ) 650 MG CR tablet Take  500 mg by mouth every 8 (eight) hours as needed for pain.     aspirin (ASPIRIN 81) 81 MG chewable tablet 81 mg qd     benzonatate  (TESSALON ) 100 MG capsule Take 1 capsule (100 mg total) by mouth 2 (two) times daily as needed for cough. 20 capsule 1   cilostazol  (PLETAL ) 100 MG tablet Take 1 tablet (100 mg total) by mouth 2 (two) times daily. 60 tablet 2   indomethacin  (INDOCIN  SR) 75 MG CR capsule TAKE 1 CAPSULE BY MOUTH TWICE A DAY WITH MEALS AS NEEDED 180 capsule 2   losartan -hydrochlorothiazide  (HYZAAR) 50-12.5 MG tablet TAKE 1 TABLET BY MOUTH DAILY. MUST KEEP APPT 06/26/23 FOR FUTURE REFILLS 90 tablet 1   Multiple Vitamins-Minerals (VITAMIN D3 COMPLETE PO) Take 500 Units by mouth daily.     nortriptyline  (PAMELOR ) 10 MG capsule Take 1 capsule (10 mg total) by mouth at bedtime. Take 10 mg by mouth at bedtime. (Patient taking  differently: Take 10 mg by mouth 3 (three) times daily. Take 3 capsules by mouth at every morning.) 90 capsule 3   No current facility-administered medications for this visit.    REVIEW OF SYSTEMS:  [X]  denotes positive finding, [ ]  denotes negative finding Cardiac  Comments:  Chest pain or chest pressure: ***   Shortness of breath upon exertion:    Short of breath when lying flat:    Irregular heart rhythm:        Vascular    Pain in calf, thigh, or hip brought on by ambulation:    Pain in feet at night that wakes you up from your sleep:     Blood clot in your veins:    Leg swelling:         Pulmonary    Oxygen at home:    Productive cough:     Wheezing:         Neurologic    Sudden weakness in arms or legs:     Sudden numbness in arms or legs:     Sudden onset of difficulty speaking or slurred speech:    Temporary loss of vision in one eye:     Problems with dizziness:         Gastrointestinal    Blood in stool:     Vomited blood:         Genitourinary    Burning when urinating:     Blood in urine:        Psychiatric    Major depression:         Hematologic    Bleeding problems:    Problems with blood clotting too easily:        Skin    Rashes or ulcers:        Constitutional    Fever or chills:      PHYSICAL EXAM: There were no vitals filed for this visit.  GENERAL: The patient is a well-nourished female, in no acute distress. The vital signs are documented above. CARDIAC: There is a regular rate and rhythm.  VASCULAR: *** PULMONARY: There is good air exchange bilaterally without wheezing or rales. ABDOMEN: Soft and non-tender with normal pitched bowel sounds.  MUSCULOSKELETAL: There are no major deformities or cyanosis. NEUROLOGIC: No focal weakness or paresthesias are detected. SKIN: There are no ulcers or rashes noted. PSYCHIATRIC: The patient has a normal affect.  DATA:   ABIs 07/17/2024 are 0.87 on the right multiphasic and 0.89 on the left  multiphasic  Assessment/Plan:  63 y.o. female, with history of hyperlipidemia that presents for evaluation of bilateral leg pain below the knee previously thought to be neurogenic claudication.  This did not improve with lumbar spine surgery.   Lonni DOROTHA Gaskins, MD [facility] Office: [phone]

## 2024-07-21 ENCOUNTER — Encounter: Payer: Self-pay | Admitting: Vascular Surgery

## 2024-07-21 ENCOUNTER — Ambulatory Visit: Attending: Vascular Surgery | Admitting: Vascular Surgery

## 2024-07-21 VITALS — BP 121/82 | HR 116 | Temp 97.9°F | Ht 68.5 in | Wt 306.8 lb

## 2024-07-21 DIAGNOSIS — M79662 Pain in left lower leg: Secondary | ICD-10-CM

## 2024-07-21 DIAGNOSIS — M79661 Pain in right lower leg: Secondary | ICD-10-CM

## 2024-08-13 ENCOUNTER — Other Ambulatory Visit: Payer: Self-pay | Admitting: *Deleted

## 2024-08-13 DIAGNOSIS — E6609 Other obesity due to excess calories: Secondary | ICD-10-CM

## 2024-08-13 DIAGNOSIS — E7800 Pure hypercholesterolemia, unspecified: Secondary | ICD-10-CM

## 2024-08-13 DIAGNOSIS — I10 Essential (primary) hypertension: Secondary | ICD-10-CM

## 2024-08-13 DIAGNOSIS — E559 Vitamin D deficiency, unspecified: Secondary | ICD-10-CM

## 2024-08-13 DIAGNOSIS — J4520 Mild intermittent asthma, uncomplicated: Secondary | ICD-10-CM

## 2024-08-13 DIAGNOSIS — E538 Deficiency of other specified B group vitamins: Secondary | ICD-10-CM

## 2024-08-21 ENCOUNTER — Other Ambulatory Visit

## 2024-08-21 DIAGNOSIS — E559 Vitamin D deficiency, unspecified: Secondary | ICD-10-CM

## 2024-08-21 DIAGNOSIS — I10 Essential (primary) hypertension: Secondary | ICD-10-CM

## 2024-08-21 DIAGNOSIS — E7800 Pure hypercholesterolemia, unspecified: Secondary | ICD-10-CM

## 2024-08-21 DIAGNOSIS — J4520 Mild intermittent asthma, uncomplicated: Secondary | ICD-10-CM

## 2024-08-21 DIAGNOSIS — E6609 Other obesity due to excess calories: Secondary | ICD-10-CM

## 2024-08-21 DIAGNOSIS — E538 Deficiency of other specified B group vitamins: Secondary | ICD-10-CM

## 2024-08-22 ENCOUNTER — Ambulatory Visit: Payer: Self-pay

## 2024-08-22 LAB — COMPREHENSIVE METABOLIC PANEL WITH GFR
ALT: 18 IU/L (ref 0–32)
AST: 16 IU/L (ref 0–40)
Albumin: 3.9 g/dL (ref 3.9–4.9)
Alkaline Phosphatase: 102 IU/L (ref 49–135)
BUN/Creatinine Ratio: 23 (ref 12–28)
BUN: 17 mg/dL (ref 8–27)
Bilirubin Total: 0.5 mg/dL (ref 0.0–1.2)
CO2: 26 mmol/L (ref 20–29)
Calcium: 9.6 mg/dL (ref 8.7–10.3)
Chloride: 103 mmol/L (ref 96–106)
Creatinine, Ser: 0.73 mg/dL (ref 0.57–1.00)
Globulin, Total: 2.2 g/dL (ref 1.5–4.5)
Glucose: 89 mg/dL (ref 70–99)
Potassium: 4.3 mmol/L (ref 3.5–5.2)
Sodium: 141 mmol/L (ref 134–144)
Total Protein: 6.1 g/dL (ref 6.0–8.5)
eGFR: 94 mL/min/1.73 (ref >59)

## 2024-08-22 LAB — LIPID PANEL
Chol/HDL Ratio: 2.3 ratio (ref 0.0–4.4)
Cholesterol, Total: 168 mg/dL (ref 100–199)
HDL: 74 mg/dL (ref >39)
LDL Chol Calc (NIH): 68 mg/dL (ref 0–99)
Triglycerides: 154 mg/dL — ABNORMAL HIGH (ref 0–149)
VLDL Cholesterol Cal: 26 mg/dL (ref 5–40)

## 2024-08-22 LAB — CBC WITH DIFFERENTIAL/PLATELET
Basophils Absolute: 0.1 x10E3/uL (ref 0.0–0.2)
Basos: 2 %
EOS (ABSOLUTE): 0.3 x10E3/uL (ref 0.0–0.4)
Eos: 4 %
Hematocrit: 44.2 % (ref 34.0–46.6)
Hemoglobin: 13.9 g/dL (ref 11.1–15.9)
Immature Grans (Abs): 0 x10E3/uL (ref 0.0–0.1)
Immature Granulocytes: 0 %
Lymphocytes Absolute: 3.1 x10E3/uL (ref 0.7–3.1)
Lymphs: 37 %
MCH: 28 pg (ref 26.6–33.0)
MCHC: 31.4 g/dL — ABNORMAL LOW (ref 31.5–35.7)
MCV: 89 fL (ref 79–97)
Monocytes Absolute: 0.7 x10E3/uL (ref 0.1–0.9)
Monocytes: 8 %
Neutrophils Absolute: 4 x10E3/uL (ref 1.4–7.0)
Neutrophils: 49 %
Platelets: 472 x10E3/uL — ABNORMAL HIGH (ref 150–450)
RBC: 4.97 x10E6/uL (ref 3.77–5.28)
RDW: 12.3 % (ref 11.7–15.4)
WBC: 8.3 x10E3/uL (ref 3.4–10.8)

## 2024-08-22 LAB — HEMOGLOBIN A1C
Est. average glucose Bld gHb Est-mCnc: 117 mg/dL
Hgb A1c MFr Bld: 5.7 % — ABNORMAL HIGH (ref 4.8–5.6)

## 2024-08-22 LAB — VITAMIN D 25 HYDROXY (VIT D DEFICIENCY, FRACTURES): Vit D, 25-Hydroxy: 37.1 ng/mL (ref 30.0–100.0)

## 2024-08-22 LAB — VITAMIN B12: Vitamin B-12: >2000 pg/mL — ABNORMAL HIGH (ref 232–1245)

## 2024-08-27 ENCOUNTER — Ambulatory Visit: Admitting: Family Medicine

## 2024-08-27 ENCOUNTER — Encounter: Payer: Self-pay | Admitting: Family Medicine

## 2024-08-27 VITALS — BP 113/72 | HR 109 | Ht 68.5 in | Wt 305.1 lb

## 2024-08-27 DIAGNOSIS — Z0000 Encounter for general adult medical examination without abnormal findings: Secondary | ICD-10-CM

## 2024-08-27 DIAGNOSIS — Z23 Encounter for immunization: Secondary | ICD-10-CM

## 2024-08-27 DIAGNOSIS — I739 Peripheral vascular disease, unspecified: Secondary | ICD-10-CM

## 2024-08-27 NOTE — Progress Notes (Signed)
 Established Patient Office Visit  Subjective   Patient ID: [name], female    DOB: Nov 28, 1960  Age: 63 y.o. MRN: [id number]  Chief Complaint  Patient presents with   Annual Exam    HPI Subjective - Requesting medication for weight loss. Needs to lose 50 pounds to be eligible for bilateral knee replacement surgery. Has tried losing weight independently and was able to lose 10-15 pounds but experienced frustration with weight fluctuations despite consistent effort.  Medications Current medication is cilostazol . B12 supplement is also taken.  PMH, PSH, FH, Social Hx PMHx: Peripheral artery disease (PAD), prediabetes, borderline high platelets (stable), history of high blood pressure (now controlled), history of low back surgery (fusion with screws). No history of sleep apnea, pancreatitis, or symptomatic gallstones. PSH: Low back surgery (fusion). FH: No family history of multiple endocrine neoplasia or thyroid  cancer. Social Hx: Retired. Lives alone now as daughter and son-in-law moved out. Reports feeling less stress from caregiving. Sleep is sometimes disrupted by a large dog.  ROS Constitutional: Denies fever, chills. Cardiovascular: Denies chest pain. Reports a recent episode of sharp, burning pain in the right hip/buttock area while lying on side, which occurred 4-5 times over a few seconds and then resolved, leaving soreness. Integumentary: Denies rash associated with the hip/buttock pain. Neurological: Denies weakness or prolonged numbness.   The 10-year ASCVD risk score (Arnett DK, et al., 2019) is: 2.8%  Health Maintenance Due  Topic Date Due   Pneumococcal Vaccine: 50+ Years (1 of 2 - PCV) Never done   Cervical Cancer Screening (HPV/Pap Cotest)  10/26/2010   Colonoscopy  07/10/2023      Objective:     BP 113/72   Pulse (!) 109   Ht 5' 8.5 (1.74 m)   Wt (!) 305 lb 1.9 oz (138.4 kg)   LMP 06/10/2015   SpO2 95%   BMI 45.72 kg/m    Physical  Exam Gen: alert, oriented HEENT: perrla, eomi, mmm CV: rrr, no murmur Pulm: lctab. No wheeze or crackles.  GI: soft, nbs.  Nontender to palpation MSK: strength equal b/l. Normal gait Ext: no pedal edema Skin: warm and dry, no rashes Psych: pleasant affect.  Spontaneous speech     No results found for any visits on 08/27/24.      Assessment & Plan:   Physical exam, annual  Morbid obesity (HCC) Assessment & Plan: - Seeking assistance with weight loss to qualify for bilateral knee replacement. Requires 50-pound weight loss. Previous attempts at self-directed weight loss were partially successful but not sustained. A1C is in pre-diabetic range but does not qualify for diabetes-specific medications. Liver function is normal. Discussed several medication options. - Discussed non-GLP-1 options including phentermine (and its stimulant side effects), and the combination of bupropion/naltrexone (Contrave), noting the components can be prescribed separately and more affordably. Discussed Topamax but noted common side effect of brain fog. - Discussed GLP-1 agonists Wegovy and Zepbound. Patient has [employer]. Counseled that insurance coverage is specific and often requires prior authorization. Patient may qualify for Wegovy coverage due to PAD as a form of cardiovascular disease. Zepbound is approved for obesity and sleep apnea. - Provided education on self-administration of Wegovy and Zepbound injections. Reviewed potential side effects including nausea, constipation, and bloating, and management strategies (anti-emetics, Miralax). - Reviewed contraindications and black box warnings (thyroid  C-cell tumors), noting no personal or family history of concern. - Offered referral to Healthy Weight and Wellness program for structured support, which patient will consider  after checking insurance benefits. - Plan to await confirmation of insurance coverage for [facility] or Zepbound  before prescribing. If approved, will start at the lowest dose and reassess before dose escalation.  Orders: -     Amb Ref to Medical Weight Management  PAD (peripheral artery disease) Assessment & Plan: - Continues on cilostazol  with overall improvement in leg symptoms, though experiences good and bad days. Recent ABIs were 0.87 and 0.89, which is consistent with PAD. Vascular surgeon was reportedly not concerned and recommended follow-up in one year. This diagnosis may support insurance coverage for weight loss medication. - Continue cilostazol  as prescribed.   Immunization due -     Flu vaccine trivalent PF, 6mos and older(Flulaval,Afluria,Fluarix,Fluzone)   Neuropathic Pain - Reports recent episode of acute, sharp, burning right-sided gluteal/hip pain while lying in bed, lasting seconds and recurring 4-5 times. Consistent with nerve impingement pain. No associated rash, weakness, or persistent numbness. - Reassured this is likely positional nerve compression and not emergent unless associated with weakness, prolonged numbness, or a rash (suggestive of shingles). - Advised stretching and changing positions if it recurs.  Return in about 3 months (around 11/27/2024) for weight.    Toribio MARLA Slain, MD

## 2024-08-27 NOTE — Patient Instructions (Signed)
 It was nice to see you today,  We addressed the following topics today: - Please call your insurance company, [employer], to ask about coverage for weight loss medications. - Specifically ask if they cover Wegovy or Zepbound and what diagnoses they are specifically covered for. Do not ask about Ozempic or mounjaro, as that is only covered for diabetes. - If the medication is approved by your insurance, I will send in the prescription. - Consider the referral to the Healthy Weight and Wellness program for additional support.  Have a great day,  Rolan Slain, MD

## 2024-08-27 NOTE — Assessment & Plan Note (Signed)
-   Seeking assistance with weight loss to qualify for bilateral knee replacement. Requires 50-pound weight loss. Previous attempts at self-directed weight loss were partially successful but not sustained. A1C is in pre-diabetic range but does not qualify for diabetes-specific medications. Liver function is normal. Discussed several medication options. - Discussed non-GLP-1 options including phentermine (and its stimulant side effects), and the combination of bupropion/naltrexone (Contrave), noting the components can be prescribed separately and more affordably. Discussed Topamax but noted common side effect of brain fog. - Discussed GLP-1 agonists Wegovy and Zepbound. Patient has [employer]. Counseled that insurance coverage is specific and often requires prior authorization. Patient may qualify for Wegovy coverage due to PAD as a form of cardiovascular disease. Zepbound is approved for obesity and sleep apnea. - Provided education on self-administration of Wegovy and Zepbound injections. Reviewed potential side effects including nausea, constipation, and bloating, and management strategies (anti-emetics, Miralax). - Reviewed contraindications and black box warnings (thyroid  C-cell tumors), noting no personal or family history of concern. - Offered referral to Healthy Weight and Wellness program for structured support, which patient will consider after checking insurance benefits. - Plan to await confirmation of insurance coverage for [facility] or Zepbound before prescribing. If approved, will start at the lowest dose and reassess before dose escalation.

## 2024-08-27 NOTE — Assessment & Plan Note (Signed)
-   Continues on cilostazol  with overall improvement in leg symptoms, though experiences good and bad days. Recent ABIs were 0.87 and 0.89, which is consistent with PAD. Vascular surgeon was reportedly not concerned and recommended follow-up in one year. This diagnosis may support insurance coverage for weight loss medication. - Continue cilostazol  as prescribed.

## 2024-08-31 ENCOUNTER — Telehealth: Payer: Self-pay

## 2024-08-31 NOTE — Telephone Encounter (Signed)
 Copied from CRM #[id number]. Topic: Clinical - Medication Question >> Aug 31, 2024 11:38 AM Donna BRAVO wrote: Reason for CRM: patient calling stating insurance needs note for the Zepboud or Wegovy to see if they will cover the medication  The dr needs to call this number to give information [phone]   Patient stated she would like a call back from a nurse regarding this issue.   Called pt she stated that she called insurance company they will not give her information we will have to reach out to them if she start one of these wt. loss medication

## 2024-09-01 NOTE — Telephone Encounter (Signed)
 I don't understand how an insurance company can withhold benefits information from their customer but I will call the number to talk to them.

## 2024-09-07 ENCOUNTER — Telehealth: Payer: Self-pay

## 2024-09-07 NOTE — Telephone Encounter (Signed)
 Copied from CRM [id number]. Topic: General - Other >> Sep 07, 2024  2:54 PM Geneva B wrote: Reason for CRM: pt is calling to see if there is any update about dr chandra reaching out to the insurance company on her behalf please call pt back to give her the information [phone]

## 2024-09-09 NOTE — Telephone Encounter (Signed)
 I called the number, it was was [facility]. They wanted information for a prior auth.  They said they would be faxing it over.

## 2024-09-10 ENCOUNTER — Telehealth: Payer: Self-pay

## 2024-09-10 NOTE — Telephone Encounter (Signed)
 Copied from CRM #[id number]. Topic: Clinical - Medication Prior Auth >> Sep 10, 2024  9:55 AM Delon DASEN wrote: Reason for CRM: Patient would like an update on PA for weight loss medication- [phone]   Pt is advised

## 2024-09-10 NOTE — Telephone Encounter (Signed)
 Called pt she is advised that we are waiting on the fax

## 2024-09-14 ENCOUNTER — Telehealth: Payer: Self-pay

## 2024-09-14 ENCOUNTER — Telehealth: Payer: Self-pay | Admitting: *Deleted

## 2024-09-14 NOTE — Telephone Encounter (Signed)
 Copied from CRM [id number]. Topic: General - Other >> Sep 14, 2024  2:47 PM Avram MATSU wrote: Reason for CRM:  I did a warm transfer to the CAL >> Sep 14, 2024  2:50 PM Avram MATSU wrote: Gregory was not on the phone, Lauren [facility] prior authorize rep wanted follow up on patients PA. She also stated she had a few clinical questions.

## 2024-09-14 NOTE — Telephone Encounter (Signed)
 Copied from CRM [id number]. Topic: Clinical - Medication Question >> Sep 14, 2024 12:59 PM [name]: Reason for CRM:  Patient would like an update on PA for weight loss medication- [phone]. Pt is stating a certain quiteria is needed such as to why pt is needing medication. Pt has been waiting since last week.  CB [phone] ONLY CALL THIS NUMBER   Called pt she stated that if she not approved for Zepbound she would like to try the [facility] will be faxing off progress notes 09/14/2024

## 2024-09-15 NOTE — Telephone Encounter (Signed)
 This call wad dropped when transferred.

## 2024-09-18 ENCOUNTER — Other Ambulatory Visit: Payer: Self-pay | Admitting: Family Medicine

## 2024-09-23 ENCOUNTER — Telehealth: Payer: Self-pay

## 2024-09-23 NOTE — Telephone Encounter (Signed)
 Copied from CRM [id number]. Topic: Clinical - Medication Question >> Sep 14, 2024 12:59 PM Olam RAMAN wrote: Reason for CRM:  Patient would like an update on PA for weight loss medication- [phone]. Pt is stating a certain quiteria is needed such as to why pt is needing medication. Pt has been waiting since last week.  CB [phone] ONLY CALL THIS NUMBER  Called patient she stated that her leg continued to swell down to her ankle and feet she wants this  information put in her notes she said that she is taking injections

## 2024-09-25 ENCOUNTER — Other Ambulatory Visit: Payer: Self-pay | Admitting: Family Medicine

## 2024-09-25 MED ORDER — WEGOVY 0.25 MG/0.5ML ~~LOC~~ SOAJ: 0.2500 mg | SUBCUTANEOUS | 1 refills | Status: DC

## 2024-09-25 NOTE — Telephone Encounter (Addendum)
 Yes.  It is covered IF she has been on a weight loss program for 6 months.  It says it in the prior auth forms. She does not qualify under the diagnosis of peripheral artery disease because her ABI is not less than 0.85.  I will re-submit the prescription for wegovy instead of zepbound, but I am not doing any more prior auths or appeals if it is denied until she has enrolled in a weight loss program for six months.

## 2024-09-25 NOTE — Telephone Encounter (Signed)
 Called patient she stated that she spoke to her Insurance yesterday and they said that [name] is covered

## 2024-09-25 NOTE — Telephone Encounter (Signed)
 Please let the patient  know that it looks like wegovy would be covered but only after completing at least 6 months of a diet and exercise program.  This would be something like the healthy weight and wellness program we discussed.  If she would like the referral to healthy weight and wellness I can put it in, but otherwise she would still not qualify for wegovy until after that 6 month period.

## 2024-09-28 ENCOUNTER — Telehealth: Payer: Self-pay | Admitting: Family Medicine

## 2024-09-28 NOTE — Telephone Encounter (Unsigned)
 Copied from CRM [id number]. Topic: Clinical - Prescription Issue >> Sep 28, 2024 12:54 PM Tiffini S wrote: Reason for CRM: Patient states that she was told by the insurance that [facility] do not have any restriction that requires exercise or a weight management program for 6 months Pcp must go online to fill out information: BMI must be over 31  Please call the patient at [phone] to discuss

## 2024-09-29 ENCOUNTER — Other Ambulatory Visit: Payer: Self-pay | Admitting: Family Medicine

## 2024-09-29 MED ORDER — WEGOVY 0.25 MG/0.5ML ~~LOC~~ SOAJ: 0.2500 mg | SUBCUTANEOUS | 1 refills | Status: AC

## 2024-09-29 NOTE — Telephone Encounter (Signed)
 Called patient she is advised of the recommendation she stated that her insurance company said that she had to have a BMI  over 30% to get approved for this [employer]

## 2024-09-29 NOTE — Telephone Encounter (Signed)
 The prescription for wegovy has already been sent in and will go through the prior auth process, which is now being handled by someone outside the office. Someone will let her know if it's been approved or denied once we hear back from her insurance company.

## 2024-09-30 ENCOUNTER — Other Ambulatory Visit (HOSPITAL_COMMUNITY): Payer: Self-pay

## 2024-09-30 ENCOUNTER — Encounter: Payer: Self-pay | Admitting: Pharmacy Technician

## 2024-09-30 ENCOUNTER — Telehealth: Payer: Self-pay | Admitting: Pharmacy Technician

## 2024-09-30 NOTE — Telephone Encounter (Signed)
 Called and spoke with patient. Patient is aware the PA is being processed.

## 2024-09-30 NOTE — Telephone Encounter (Signed)
 Error

## 2024-09-30 NOTE — Telephone Encounter (Signed)
 Pharmacy Patient Advocate Encounter   Received notification from Onbase that prior authorization for [facility] 0.25MG /0.5ML auto-injectors is required/requested.   Insurance verification completed.   The patient is insured through [facility].   Per test claim: PA required; PA submitted to above mentioned insurance via Latent Key/confirmation #/EOC [id number] Status is pending

## 2024-10-01 NOTE — Telephone Encounter (Signed)
 Pharmacy Patient Advocate Encounter  Received notification from [facility] that Prior Authorization for [facility] 0.25MG /0.5ML auto-injectors has been DENIED.  Full denial letter will be uploaded to the media tab. See denial reason below.   PA #/Case ID/Reference #: [id number]

## 2024-10-02 NOTE — Telephone Encounter (Signed)
 Pt informed of below.

## 2024-10-02 NOTE — Telephone Encounter (Signed)
 Please let the patient know that her wegovy  prescription was denied for the reasons we discussed previously.

## 2024-10-26 ENCOUNTER — Ambulatory Visit: Payer: Self-pay

## 2024-10-26 NOTE — Telephone Encounter (Signed)
 FYI Only or Action Required?: Action required by provider: clinical question for provider and requests medication to pharmacy today, acute scheduled on 10/28/24.  Patient was last seen in primary care on 08/27/2024 by Chandra Toribio POUR, MD.  Called Nurse Triage reporting Foot Pain.  Symptoms began several days ago.  Interventions attempted: Rest, hydration, or home remedies.  Symptoms are: gradually worsening.  Triage Disposition: See PCP When Office is Open (Within 3 Days)  Patient/caregiver understands and will follow disposition?: Yes    Copied from CRM #[id number]. Topic: Clinical - Red Word Triage >> Oct 26, 2024 12:21 PM Rea ORN wrote: Red Word that prompted transfer to Nurse Triage: Bad cramps in legs and feet that started yesterday. Today pt has left heel pain. The pain is so bad that she can not walk on it. She believes the heel pain was exaggerated by the cramps yesterday.  She is asking for PCP to send in a steroid. Reason for Disposition  [1] MODERATE pain (e.g., interferes with normal activities, limping) AND [2] present > 3 days  Answer Assessment - Initial Assessment Questions Additional info: Would like steroid called in today. History of achillis surgery, has intermittent flairs, steroids has controlled flair in the past.  Acute visit with pcp scheduled on 10/28/24. She is requesting prescription for steroids today to [facility].   1. ONSET: When did the pain start?      yesterday 2. LOCATION: Where is the pain located?      Left  3. PAIN: How bad is the pain?    (Scale 1-10; or mild, moderate, severe)     cramping 4. WORK OR EXERCISE: Has there been any recent work or exercise that involved this part of the body?       5. CAUSE: What do you think is causing the leg pain?     Cramping-history of achilis tendinitis 6. OTHER SYMPTOMS: Do you have any other symptoms? (e.g., chest pain, back pain, breathing difficulty, swelling, rash, fever,  numbness, weakness)     Hurts worse with weight bearing 7. PREGNANCY: Is there any chance you are pregnant? When was your last menstrual period?  Protocols used: Leg Pain-A-AH

## 2024-10-27 NOTE — Telephone Encounter (Signed)
 Called pt she is agreeable to the recommendation  she requested for her appt. To be canceled

## 2024-10-27 NOTE — Telephone Encounter (Signed)
 I see where the pt has had a surgical repair of a ruptured achilles tendon.  Oral steroids can increase the risk of achillles tendon ruptures, so if she is having pain in that area and she is unable to walk on it, prescribing steroids is not a good idea, especially with her surgical history.    Can you ask her who her orthopedist is? It would be best to see the practice that repaired her previous achilles injury, but [facility] both have same day urgent visits as well.  It would be more helpful for her to go to a orthopedic urgent care than it would be to come here for an injury like this.  They can get imaging faster and set her up an appointment with the orthopedist quicker than if I order it.

## 2024-10-28 ENCOUNTER — Ambulatory Visit: Admitting: Family Medicine

## 2024-11-30 ENCOUNTER — Ambulatory Visit: Admitting: Family Medicine

## 2024-12-02 ENCOUNTER — Other Ambulatory Visit: Payer: Self-pay | Admitting: Family Medicine

## 2024-12-17 ENCOUNTER — Ambulatory Visit: Admitting: Family Medicine
